# Patient Record
Sex: Female | Born: 1960
Health system: Southern US, Community
[De-identification: ages and names within clinical notes are randomized; demographics above are authoritative.]

## PROBLEM LIST (undated history)

## (undated) DIAGNOSIS — F32A Depression, unspecified: Secondary | ICD-10-CM

## (undated) DIAGNOSIS — Z9289 Personal history of other medical treatment: Secondary | ICD-10-CM

## (undated) DIAGNOSIS — C649 Malignant neoplasm of unspecified kidney, except renal pelvis: Secondary | ICD-10-CM

## (undated) DIAGNOSIS — D649 Anemia, unspecified: Secondary | ICD-10-CM

## (undated) DIAGNOSIS — K661 Hemoperitoneum: Secondary | ICD-10-CM

## (undated) DIAGNOSIS — K683 Retroperitoneal hematoma: Secondary | ICD-10-CM

## (undated) DIAGNOSIS — T4145XA Adverse effect of unspecified anesthetic, initial encounter: Secondary | ICD-10-CM

## (undated) DIAGNOSIS — K219 Gastro-esophageal reflux disease without esophagitis: Secondary | ICD-10-CM

## (undated) DIAGNOSIS — F329 Major depressive disorder, single episode, unspecified: Secondary | ICD-10-CM

## (undated) DIAGNOSIS — K449 Diaphragmatic hernia without obstruction or gangrene: Secondary | ICD-10-CM

## (undated) DIAGNOSIS — N390 Urinary tract infection, site not specified: Secondary | ICD-10-CM

## (undated) DIAGNOSIS — I10 Essential (primary) hypertension: Secondary | ICD-10-CM

## (undated) DIAGNOSIS — N189 Chronic kidney disease, unspecified: Secondary | ICD-10-CM

## (undated) DIAGNOSIS — T8859XA Other complications of anesthesia, initial encounter: Secondary | ICD-10-CM

## (undated) HISTORY — DX: Essential (primary) hypertension: I10

## (undated) HISTORY — DX: Urinary tract infection, site not specified: N39.0

## (undated) HISTORY — DX: Gastro-esophageal reflux disease without esophagitis: K21.9

## (undated) HISTORY — DX: Chronic kidney disease, unspecified: N18.9

## (undated) HISTORY — DX: Depression, unspecified: F32.A

## (undated) HISTORY — DX: Major depressive disorder, single episode, unspecified: F32.9

## (undated) HISTORY — DX: Malignant neoplasm of unspecified kidney, except renal pelvis: C64.9

## (undated) HISTORY — DX: Diaphragmatic hernia without obstruction or gangrene: K44.9

---
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
## 2003-08-25 ENCOUNTER — Other Ambulatory Visit: Admission: RE | Admit: 2003-08-25 | Discharge: 2003-08-25 | Payer: Self-pay | Admitting: Obstetrics and Gynecology

## 2003-09-15 ENCOUNTER — Ambulatory Visit (HOSPITAL_COMMUNITY): Admission: RE | Admit: 2003-09-15 | Discharge: 2003-09-15 | Payer: Self-pay | Admitting: Obstetrics and Gynecology

## 2003-09-15 ENCOUNTER — Encounter: Payer: Self-pay | Admitting: Obstetrics and Gynecology

## 2004-12-09 HISTORY — PX: AUGMENTATION MAMMAPLASTY: SUR837

## 2005-05-23 ENCOUNTER — Ambulatory Visit (HOSPITAL_COMMUNITY): Admission: RE | Admit: 2005-05-23 | Discharge: 2005-05-23 | Payer: Self-pay | Admitting: Plastic Surgery

## 2005-05-24 ENCOUNTER — Ambulatory Visit (HOSPITAL_COMMUNITY): Admission: RE | Admit: 2005-05-24 | Discharge: 2005-05-24 | Payer: Self-pay

## 2005-05-28 ENCOUNTER — Ambulatory Visit (HOSPITAL_COMMUNITY): Admission: RE | Admit: 2005-05-28 | Discharge: 2005-05-28 | Payer: Self-pay | Admitting: Plastic Surgery

## 2007-02-18 ENCOUNTER — Other Ambulatory Visit: Admission: RE | Admit: 2007-02-18 | Discharge: 2007-02-18 | Payer: Self-pay | Admitting: Family Medicine

## 2007-04-08 ENCOUNTER — Ambulatory Visit (HOSPITAL_COMMUNITY): Admission: RE | Admit: 2007-04-08 | Discharge: 2007-04-08 | Payer: Self-pay | Admitting: General Surgery

## 2007-04-08 HISTORY — PX: UMBILICAL HERNIA REPAIR: SHX196

## 2008-03-25 ENCOUNTER — Other Ambulatory Visit: Admission: RE | Admit: 2008-03-25 | Discharge: 2008-03-25 | Payer: Self-pay | Admitting: Family Medicine

## 2011-04-26 NOTE — Op Note (Signed)
NAMEMUREL, WIGLE               ACCOUNT NO.:  [id number]   MEDICAL RECORD NO.:  [id number]          PATIENT TYPE:  AMB   LOCATION:  DAY                          FACILITY:  [facility]   PHYSICIAN:  Timothy E. Earlene Plater, M.D. DATE OF BIRTH:  1961/04/29   DATE OF PROCEDURE:  04/08/2007  DATE OF DISCHARGE:                               OPERATIVE REPORT   PREOPERATIVE DIAGNOSES:  1. Questionable ventral incisional hernia.  2. Umbilical hernia.  3. Diastasis recti.   POSTOPERATIVE DIAGNOSES:  1. Umbilical hernia.  2. Diastasis recti.   PROCEDURE:  1. Laparoscopy  2. Plication of rectus muscles.  3. Repair of umbilical hernia.  4. Revision of scar   SURGEON:  Lorelee New, MD   ASSISTANT:  Alfonse Ras, MD   ANESTHESIA:  General.   INDICATIONS:  Ms. Amaker is 6.  She has was is thought be a ventral  incisional hernia.  She had an emergency C-section with a vertical  incision and over time has developed protrusion at the superior aspect  of the incision and around the umbilicus.  When seen and examined in the  office, I felt like it was at least a ventral incisional hernia or  perhaps that in combination with an umbilical hernia.  Diastasis recti  was also prominent in the upper abdomen.  She is thin and has no risk  factors.  She is also unhappy with her vertical skin scar and wanted  that revised.  We talked in detail about laparoscopy and appropriate  repair.  She agrees and understands.  She was seen and identified this  morning.  Permit signed.   DESCRIPTION OF PROCEDURE:  The patient was taken to the operating room  and was placed supine.  She was carefully positioned and then general  endotracheal anesthesia was  administered.  The abdomen was completely  prepped and draped in the usual fashion.  We elected to do an OptiVu  insertion of an 11 port with the laparoscope.  This was done in the left  upper quadrant without complication.  The peritoneum was entered and  insufflated.  Using the angled scope, complete visualization of the  anterior abdominal wall was accomplished and, in fact, there was no  herniation.  Even with maximum CO2 pressure in the  intra-abdominal  area, there was no protrusion through the skin.  We were very careful to  ascertain the diastasis versus ventral versus umbilical hernia.  So, we  elected to absolutely not put any mesh in and that mesh would not  accomplish the desired results of repair of the abdominal wall and the  umbilical hernia was too tiny to be repaired with mesh.  So, we elected  to remove the scope and the trocar.  All of the CO2 was removed as well.   The patient insisted that we also do a revision of the skin scar, and so  I had carefully marked the scar during the prep time and that old scar  tissue was sharply excised very carefully from the infraumbilical fold  to the suprapubic area.  Bleeding was controlled.  I raised a flap on  either side so that the skin could be approximated.  In doing so, it was  obvious that the edges of the rectus muscle were separated.  Also, the  umbilical hernia was clearly defined.  It was about 8 to 10 mm.  So, I  elected to close the umbilical hernia with a #1 Novofil inverted and,  since we were right there in the subcutaneous space looking at the  fascia, I elected to plicate the fascia from above the umbilicus  tapering down to the suprapubic area.  This was done after relaxing  incisions were made and was accomplished with 2-0 PDS suture.  Bleeding  was carefully controlled.  The wound was perfectly dry.  It  was  irrigated.  The subcu was approximated with 4-0 Vicryl.  The skin was  closed with 4-0 Monocryl subcuticular.  Steri-Strips were applied.  Also, the port site was closed in the same fashion.  All counts were  correct.  She tolerated the surgery well and was removed to the recovery  room in good condition.   She will be sent home with Percocet instructions  and an abdominal binder  to wear when active.  She will be followed in the office as well.      Timothy E. Earlene Plater, M.D.  Electronically Signed     TED/MEDQ  D:  04/08/2007  T:  04/08/2007  Job:  [id number]

## 2017-09-25 DIAGNOSIS — Z01419 Encounter for gynecological examination (general) (routine) without abnormal findings: Secondary | ICD-10-CM | POA: Diagnosis not present

## 2017-09-25 DIAGNOSIS — Z0000 Encounter for general adult medical examination without abnormal findings: Secondary | ICD-10-CM | POA: Diagnosis not present

## 2017-10-09 DIAGNOSIS — N6320 Unspecified lump in the left breast, unspecified quadrant: Secondary | ICD-10-CM | POA: Diagnosis not present

## 2017-10-16 DIAGNOSIS — N390 Urinary tract infection, site not specified: Secondary | ICD-10-CM | POA: Diagnosis not present

## 2017-10-21 ENCOUNTER — Ambulatory Visit
Admission: RE | Admit: 2017-10-21 | Discharge: 2017-10-21 | Disposition: A | Discharge status: Home / Self Care | Payer: BLUE CROSS/BLUE SHIELD | Source: Ambulatory Visit | Attending: Family Medicine | Admitting: Family Medicine

## 2017-10-21 ENCOUNTER — Other Ambulatory Visit: Payer: Self-pay | Admitting: Family Medicine

## 2017-10-21 DIAGNOSIS — R109 Unspecified abdominal pain: Secondary | ICD-10-CM

## 2017-10-21 DIAGNOSIS — R319 Hematuria, unspecified: Secondary | ICD-10-CM

## 2017-10-27 DIAGNOSIS — Z23 Encounter for immunization: Secondary | ICD-10-CM | POA: Diagnosis not present

## 2017-11-11 DIAGNOSIS — R109 Unspecified abdominal pain: Secondary | ICD-10-CM | POA: Diagnosis not present

## 2017-11-11 DIAGNOSIS — R1012 Left upper quadrant pain: Secondary | ICD-10-CM | POA: Diagnosis not present

## 2018-08-24 DIAGNOSIS — Z1231 Encounter for screening mammogram for malignant neoplasm of breast: Secondary | ICD-10-CM | POA: Diagnosis not present

## 2018-09-05 ENCOUNTER — Emergency Department (HOSPITAL_COMMUNITY): Payer: BLUE CROSS/BLUE SHIELD

## 2018-09-05 ENCOUNTER — Encounter (HOSPITAL_COMMUNITY): Payer: Self-pay | Admitting: Emergency Medicine

## 2018-09-05 ENCOUNTER — Other Ambulatory Visit: Payer: Self-pay

## 2018-09-05 ENCOUNTER — Inpatient Hospital Stay (HOSPITAL_COMMUNITY)
Admission: EM | Admit: 2018-09-05 | Discharge: 2018-09-28 | DRG: 656 | Disposition: A | Discharge status: Home / Self Care | Payer: BLUE CROSS/BLUE SHIELD | Attending: Urology | Admitting: Urology

## 2018-09-05 DIAGNOSIS — Z4901 Encounter for fitting and adjustment of extracorporeal dialysis catheter: Secondary | ICD-10-CM | POA: Diagnosis not present

## 2018-09-05 DIAGNOSIS — E872 Acidosis: Secondary | ICD-10-CM | POA: Diagnosis present

## 2018-09-05 DIAGNOSIS — R571 Hypovolemic shock: Secondary | ICD-10-CM | POA: Diagnosis not present

## 2018-09-05 DIAGNOSIS — R188 Other ascites: Secondary | ICD-10-CM | POA: Diagnosis not present

## 2018-09-05 DIAGNOSIS — Z87448 Personal history of other diseases of urinary system: Secondary | ICD-10-CM | POA: Diagnosis not present

## 2018-09-05 DIAGNOSIS — Z23 Encounter for immunization: Secondary | ICD-10-CM | POA: Diagnosis not present

## 2018-09-05 DIAGNOSIS — J90 Pleural effusion, not elsewhere classified: Secondary | ICD-10-CM | POA: Diagnosis not present

## 2018-09-05 DIAGNOSIS — N179 Acute kidney failure, unspecified: Secondary | ICD-10-CM | POA: Diagnosis not present

## 2018-09-05 DIAGNOSIS — K661 Hemoperitoneum: Secondary | ICD-10-CM | POA: Diagnosis not present

## 2018-09-05 DIAGNOSIS — I10 Essential (primary) hypertension: Secondary | ICD-10-CM | POA: Diagnosis not present

## 2018-09-05 DIAGNOSIS — R8271 Bacteriuria: Secondary | ICD-10-CM

## 2018-09-05 DIAGNOSIS — R21 Rash and other nonspecific skin eruption: Secondary | ICD-10-CM | POA: Diagnosis not present

## 2018-09-05 DIAGNOSIS — R809 Proteinuria, unspecified: Secondary | ICD-10-CM | POA: Diagnosis not present

## 2018-09-05 DIAGNOSIS — D500 Iron deficiency anemia secondary to blood loss (chronic): Secondary | ICD-10-CM | POA: Diagnosis not present

## 2018-09-05 DIAGNOSIS — R0902 Hypoxemia: Secondary | ICD-10-CM

## 2018-09-05 DIAGNOSIS — E876 Hypokalemia: Secondary | ICD-10-CM | POA: Diagnosis not present

## 2018-09-05 DIAGNOSIS — R58 Hemorrhage, not elsewhere classified: Secondary | ICD-10-CM

## 2018-09-05 DIAGNOSIS — E781 Pure hyperglyceridemia: Secondary | ICD-10-CM | POA: Diagnosis not present

## 2018-09-05 DIAGNOSIS — N19 Unspecified kidney failure: Secondary | ICD-10-CM | POA: Diagnosis not present

## 2018-09-05 DIAGNOSIS — R911 Solitary pulmonary nodule: Secondary | ICD-10-CM | POA: Diagnosis not present

## 2018-09-05 DIAGNOSIS — N2889 Other specified disorders of kidney and ureter: Secondary | ICD-10-CM | POA: Diagnosis not present

## 2018-09-05 DIAGNOSIS — A419 Sepsis, unspecified organism: Secondary | ICD-10-CM

## 2018-09-05 DIAGNOSIS — K219 Gastro-esophageal reflux disease without esophagitis: Secondary | ICD-10-CM | POA: Diagnosis not present

## 2018-09-05 DIAGNOSIS — C642 Malignant neoplasm of left kidney, except renal pelvis: Secondary | ICD-10-CM | POA: Diagnosis not present

## 2018-09-05 DIAGNOSIS — D62 Acute posthemorrhagic anemia: Secondary | ICD-10-CM | POA: Diagnosis not present

## 2018-09-05 DIAGNOSIS — D649 Anemia, unspecified: Secondary | ICD-10-CM

## 2018-09-05 DIAGNOSIS — D4102 Neoplasm of uncertain behavior of left kidney: Secondary | ICD-10-CM | POA: Diagnosis not present

## 2018-09-05 DIAGNOSIS — Z9889 Other specified postprocedural states: Secondary | ICD-10-CM | POA: Diagnosis not present

## 2018-09-05 DIAGNOSIS — Z87891 Personal history of nicotine dependence: Secondary | ICD-10-CM | POA: Diagnosis not present

## 2018-09-05 DIAGNOSIS — D72829 Elevated white blood cell count, unspecified: Secondary | ICD-10-CM | POA: Diagnosis present

## 2018-09-05 DIAGNOSIS — N151 Renal and perinephric abscess: Secondary | ICD-10-CM | POA: Diagnosis not present

## 2018-09-05 DIAGNOSIS — R310 Gross hematuria: Secondary | ICD-10-CM | POA: Diagnosis present

## 2018-09-05 DIAGNOSIS — R319 Hematuria, unspecified: Secondary | ICD-10-CM | POA: Diagnosis not present

## 2018-09-05 DIAGNOSIS — R6520 Severe sepsis without septic shock: Secondary | ICD-10-CM | POA: Diagnosis not present

## 2018-09-05 DIAGNOSIS — N170 Acute kidney failure with tubular necrosis: Secondary | ICD-10-CM | POA: Diagnosis not present

## 2018-09-05 DIAGNOSIS — Z79899 Other long term (current) drug therapy: Secondary | ICD-10-CM | POA: Diagnosis not present

## 2018-09-05 DIAGNOSIS — R1032 Left lower quadrant pain: Secondary | ICD-10-CM | POA: Diagnosis not present

## 2018-09-05 DIAGNOSIS — E871 Hypo-osmolality and hyponatremia: Secondary | ICD-10-CM | POA: Diagnosis not present

## 2018-09-05 DIAGNOSIS — E860 Dehydration: Secondary | ICD-10-CM | POA: Diagnosis not present

## 2018-09-05 DIAGNOSIS — N9089 Other specified noninflammatory disorders of vulva and perineum: Secondary | ICD-10-CM | POA: Diagnosis not present

## 2018-09-05 DIAGNOSIS — K683 Retroperitoneal hematoma: Secondary | ICD-10-CM | POA: Diagnosis present

## 2018-09-05 DIAGNOSIS — J9811 Atelectasis: Secondary | ICD-10-CM | POA: Diagnosis present

## 2018-09-05 HISTORY — DX: Other complications of anesthesia, initial encounter: T88.59XA

## 2018-09-05 HISTORY — DX: Hemoperitoneum: K66.1

## 2018-09-05 HISTORY — DX: Retroperitoneal hematoma: K68.3

## 2018-09-05 HISTORY — DX: Personal history of other medical treatment: Z92.89

## 2018-09-05 HISTORY — DX: Anemia, unspecified: D64.9

## 2018-09-05 HISTORY — DX: Adverse effect of unspecified anesthetic, initial encounter: T41.45XA

## 2018-09-05 LAB — COMPREHENSIVE METABOLIC PANEL
ALBUMIN: 2.6 g/dL — AB (ref 3.5–5.0)
ALT: 10 U/L (ref 0–44)
AST: 160 U/L — ABNORMAL HIGH (ref 15–41)
Alkaline Phosphatase: 72 U/L (ref 38–126)
Anion gap: 14 (ref 5–15)
BUN: 53 mg/dL — AB (ref 6–20)
CHLORIDE: 93 mmol/L — AB (ref 98–111)
CO2: 19 mmol/L — ABNORMAL LOW (ref 22–32)
Calcium: 7.7 mg/dL — ABNORMAL LOW (ref 8.9–10.3)
Creatinine, Ser: 3.21 mg/dL — ABNORMAL HIGH (ref 0.44–1.00)
GFR calc Af Amer: 17 mL/min — ABNORMAL LOW (ref >60)
GFR calc non Af Amer: 15 mL/min — ABNORMAL LOW (ref >60)
GLUCOSE: 114 mg/dL — AB (ref 70–99)
POTASSIUM: 4 mmol/L (ref 3.5–5.1)
SODIUM: 126 mmol/L — AB (ref 135–145)
Total Bilirubin: 1 mg/dL (ref 0.3–1.2)
Total Protein: 5.8 g/dL — ABNORMAL LOW (ref 6.5–8.1)

## 2018-09-05 LAB — BASIC METABOLIC PANEL
Anion gap: 19 — ABNORMAL HIGH (ref 5–15)
BUN: 50 mg/dL — AB (ref 6–20)
CO2: 19 mmol/L — ABNORMAL LOW (ref 22–32)
CREATININE: 3.14 mg/dL — AB (ref 0.44–1.00)
Calcium: 9 mg/dL (ref 8.9–10.3)
Chloride: 88 mmol/L — ABNORMAL LOW (ref 98–111)
GFR calc Af Amer: 18 mL/min — ABNORMAL LOW (ref >60)
GFR, EST NON AFRICAN AMERICAN: 15 mL/min — AB (ref >60)
GLUCOSE: 153 mg/dL — AB (ref 70–99)
Potassium: 4 mmol/L (ref 3.5–5.1)
Sodium: 126 mmol/L — ABNORMAL LOW (ref 135–145)

## 2018-09-05 LAB — URINALYSIS, ROUTINE W REFLEX MICROSCOPIC
Glucose, UA: 100 mg/dL — AB
KETONES UR: 15 mg/dL — AB
NITRITE: POSITIVE — AB
Specific Gravity, Urine: 1.02 (ref 1.005–1.030)
pH: 6.5 (ref 5.0–8.0)

## 2018-09-05 LAB — CBC
HCT: 21.2 % — ABNORMAL LOW (ref 36.0–46.0)
HEMATOCRIT: 27.5 % — AB (ref 36.0–46.0)
Hemoglobin: 8.7 g/dL — ABNORMAL LOW (ref 12.0–15.0)
Hemoglobin: 6.9 g/dL — CL (ref 12.0–15.0)
MCH: 27.9 pg (ref 26.0–34.0)
MCH: 27.9 pg (ref 26.0–34.0)
MCHC: 31.6 g/dL (ref 30.0–36.0)
MCHC: 32.5 g/dL (ref 30.0–36.0)
MCV: 88.1 fL (ref 78.0–100.0)
MCV: 85.8 fL (ref 78.0–100.0)
Platelets: 217 10*3/uL (ref 150–400)
Platelets: 185 10*3/uL (ref 150–400)
RBC: 3.12 MIL/uL — AB (ref 3.87–5.11)
RBC: 2.47 MIL/uL — ABNORMAL LOW (ref 3.87–5.11)
RDW: 13.7 % (ref 11.5–15.5)
RDW: 13.6 % (ref 11.5–15.5)
WBC: 30.1 10*3/uL — ABNORMAL HIGH (ref 4.0–10.5)
WBC: 22 10*3/uL — ABNORMAL HIGH (ref 4.0–10.5)

## 2018-09-05 LAB — I-STAT CG4 LACTIC ACID, ED
LACTIC ACID, VENOUS: 1.84 mmol/L (ref 0.5–1.9)
Lactic Acid, Venous: 2.11 mmol/L (ref 0.5–1.9)

## 2018-09-05 LAB — URINALYSIS, MICROSCOPIC (REFLEX)
RBC / HPF: >50 RBC/hpf (ref 0–5)
WBC, UA: >50 WBC/hpf (ref 0–5)

## 2018-09-05 LAB — I-STAT TROPONIN, ED: Troponin i, poc: 0.06 ng/mL (ref 0.00–0.08)

## 2018-09-05 LAB — PREPARE RBC (CROSSMATCH)

## 2018-09-05 LAB — I-STAT BETA HCG BLOOD, ED (MC, WL, AP ONLY): I-stat hCG, quantitative: 23.9 m[IU]/mL — ABNORMAL HIGH (ref <5)

## 2018-09-05 LAB — ABO/RH: ABO/RH(D): A POS

## 2018-09-05 LAB — SAVE SMEAR

## 2018-09-05 LAB — PREGNANCY, URINE: PREG TEST UR: NEGATIVE

## 2018-09-05 MED ORDER — MORPHINE SULFATE (PF) 4 MG/ML IV SOLN
6.0000 mg | Freq: Once | INTRAVENOUS | Status: AC
  Administered 2018-09-05: 6 mg via INTRAVENOUS
  Filled 2018-09-05: qty 2

## 2018-09-05 MED ORDER — HYDROMORPHONE HCL 1 MG/ML IJ SOLN: 1.0000 mg | INTRAMUSCULAR | Status: DC | PRN

## 2018-09-05 MED ORDER — HYDROMORPHONE HCL 1 MG/ML IJ SOLN
1.0000 mg | INTRAMUSCULAR | Status: DC | PRN
  Administered 2018-09-05 – 2018-09-14 (×50): 1 mg via INTRAVENOUS
  Filled 2018-09-05 (×52): qty 1

## 2018-09-05 MED ORDER — SODIUM CHLORIDE 0.9 % IV SOLN
1.0000 g | Freq: Once | INTRAVENOUS | Status: AC
  Administered 2018-09-05: 1 g via INTRAVENOUS
  Filled 2018-09-05: qty 10

## 2018-09-05 MED ORDER — SODIUM CHLORIDE 0.9 % IV SOLN
1.0000 g | INTRAVENOUS | Status: DC
  Administered 2018-09-05 – 2018-09-07 (×3): 1 g via INTRAVENOUS
  Filled 2018-09-05: qty 10
  Filled 2018-09-05: qty 1
  Filled 2018-09-05: qty 10

## 2018-09-05 MED ORDER — LIDOCAINE HCL URETHRAL/MUCOSAL 2 % EX GEL
1.0000 "application " | Freq: Once | CUTANEOUS | Status: AC
  Administered 2018-09-05: 1 via TOPICAL
  Filled 2018-09-05: qty 20

## 2018-09-05 MED ORDER — SODIUM CHLORIDE 0.9% IV SOLUTION
Freq: Once | INTRAVENOUS | Status: AC
  Administered 2018-09-05: 18:00:00 via INTRAVENOUS

## 2018-09-05 MED ORDER — SODIUM CHLORIDE 0.9 % IV BOLUS (SEPSIS)
1000.0000 mL | Freq: Once | INTRAVENOUS | Status: AC
  Administered 2018-09-05: 1000 mL via INTRAVENOUS

## 2018-09-05 MED ORDER — ONDANSETRON HCL 4 MG/2ML IJ SOLN
4.0000 mg | Freq: Once | INTRAMUSCULAR | Status: AC
  Administered 2018-09-05: 4 mg via INTRAVENOUS
  Filled 2018-09-05: qty 2

## 2018-09-05 MED ORDER — HYDROMORPHONE HCL 1 MG/ML IJ SOLN: 1.0000 mg | Freq: Once | INTRAMUSCULAR | Status: DC

## 2018-09-05 NOTE — ED Triage Notes (Signed)
Difficult IV stick  Unable to collect I-stat Lac Acid.

## 2018-09-05 NOTE — ED Provider Notes (Signed)
[facility] EMERGENCY DEPARTMENT Provider Note   CSN: [id number] Arrival date & time: 09/05/18  0813     History   Chief Complaint Chief Complaint  Patient presents with  . Hematuria    HPI Sharon Henson is a 57 y.o. female.  HPI Patient is a 57 year old female who presents to the emergency department 3 days of worsening left-sided abdominal pain with associated nausea vomiting.  She is had low-grade fevers as well.  She does not have an actual recorded temperature.  She also reports new hematuria over the past several days and a feeling of a new abdominal mass on her left side.  She had similar symptoms in November 2018 at which point she had a plain film which demonstrated possible proximal stone but when she was seen by [facility] urology and outpatient CT was ordered however the patients pain had resolved and therefore she did not undergo the study.  Pain symptoms are moderate to severe in severity at this time.  No prior history of cancer.  She does report a history of umbilical hernia repair with mesh.     History reviewed. No pertinent past medical history.  There are no active problems to display for this patient.   Past Surgical History:  Procedure Laterality Date  . CESAREAN SECTION       OB History   None      Home Medications    Prior to Admission medications   Not on File    Family History No family history on file.  Social History Social History   Tobacco Use  . Smoking status: Never Smoker  Substance Use Topics  . Alcohol use: Never    Frequency: Never  . Drug use: Never     Allergies   Patient has no known allergies.   Review of Systems Review of Systems  All other systems reviewed and are negative.    Physical Exam Updated Vital Signs BP (!) 142/127   Pulse (!) 128   Temp 98.9 F (37.2 C) (Oral)   Resp 15   SpO2 97%   Physical Exam  Constitutional: She is oriented to person, place, and time. She  appears well-developed and well-nourished. No distress.  HENT:  Head: Normocephalic and atraumatic.  Eyes: EOM are normal.  Neck: Normal range of motion.  Cardiovascular: Regular rhythm and normal heart sounds.  Tachycardia  Pulmonary/Chest: Effort normal and breath sounds normal.  Abdominal: Soft. She exhibits no distension.  Palpable mid and left-sided abdominal mass.  Nonpulsatile.  Musculoskeletal: Normal range of motion.  Neurological: She is alert and oriented to person, place, and time.  Skin: Skin is warm and dry. There is pallor.  Psychiatric: She has a normal mood and affect. Judgment normal.  Nursing note and vitals reviewed.    ED Treatments / Results  Labs (all labs ordered are listed, but only abnormal results are displayed) Labs Reviewed  BASIC METABOLIC PANEL - Abnormal; Notable for the following components:      Result Value   Sodium 126 (*)    Chloride 88 (*)    CO2 19 (*)    Glucose, Bld 153 (*)    BUN 50 (*)    Creatinine, Ser 3.14 (*)    GFR calc non Af Amer 15 (*)    GFR calc Af Amer 18 (*)    Anion gap 19 (*)    All other components within normal limits  CBC - Abnormal; Notable for the following  components:   WBC 30.1 (*)    RBC 3.12 (*)    Hemoglobin 8.7 (*)    HCT 27.5 (*)    All other components within normal limits  I-STAT BETA HCG BLOOD, ED ([facility], [facility], [facility] ONLY) - Abnormal; Notable for the following components:   I-stat hCG, quantitative 23.9 (*)    All other components within normal limits  URINE CULTURE  CULTURE, BLOOD (ROUTINE X 2)  CULTURE, BLOOD (ROUTINE X 2)  URINALYSIS, ROUTINE W REFLEX MICROSCOPIC  PREGNANCY, URINE  I-STAT TROPONIN, ED  I-STAT CG4 LACTIC ACID, ED  TYPE AND SCREEN    EKG None  Radiology Ct Renal Stone Study  Result Date: 09/05/2018 CLINICAL DATA:  Fever and nausea since Wednesday. EXAM: CT ABDOMEN AND PELVIS WITHOUT CONTRAST TECHNIQUE: Multidetector CT imaging of the abdomen and pelvis was performed following  the standard protocol without IV contrast. COMPARISON:  None. FINDINGS: Lower chest: Small left pleural effusion with mild compressive atelectasis in the left lower lobe. Heart size normal. No pericardial effusion. Hepatobiliary: Liver and gallbladder are unremarkable. No biliary ductal dilatation. Pancreas: Negative. Spleen: Negative. Adrenals/Urinary Tract: Adrenal glands and right kidney are unremarkable. There is a very large heterogeneous mass with internal hemorrhage arising from the left kidney, measuring 12.4 x 15.6 cm. Perianal stranding and fluid on the left. Hemorrhage is seen in the left renal pelvis. There may be mild left hydronephrosis. Ureters are decompressed. Bladder is very low in volume. Stomach/Bowel: Small hiatal hernia. Stomach, small bowel and colon are unremarkable. Favor fluid-filled small bowel in the dependent anatomic pelvis over free fluid. Vascular/Lymphatic: Atherosclerotic calcification of the arterial vasculature without abdominal aortic aneurysm. No definite pathologically enlarged lymph nodes. Reproductive: Uterus is visualized.  No adnexal mass. Other: Small ascites. Musculoskeletal: No worrisome lytic or sclerotic lesions. IMPRESSION: 1. Very large hemorrhagic mass arising from the left kidney, highly worrisome for renal cell carcinoma. These results were called by telephone at the time of interpretation on 09/05/2018 at 10:34 am to Dr. Jola Schmidt , who verbally acknowledged these results. 2. High attenuation within the left renal pelvis is indicative of hemorrhage/clot, with probable mild associated left hydronephrosis. 3. Small ascites. 4. Small left pleural effusion. 5.  Aortic atherosclerosis (ICD10-170.0). Electronically Signed   By: Lorin Picket M.D.   On: 09/05/2018 10:34    Procedures .Critical Care Performed by: Jola Schmidt, MD Authorized by: Jola Schmidt, MD    CRITICAL CARE Performed by: Jola Schmidt Total critical care time: 33 minutes Critical  care time was exclusive of separately billable procedures and treating other patients. Critical care was necessary to treat or prevent imminent or life-threatening deterioration. Critical care was time spent personally by me on the following activities: development of treatment plan with patient and/or surrogate as well as nursing, discussions with consultants, evaluation of patient's response to treatment, examination of patient, obtaining history from patient or surrogate, ordering and performing treatments and interventions, ordering and review of laboratory studies, ordering and review of radiographic studies, pulse oximetry and re-evaluation of patient's condition.   Medications Ordered in ED Medications  cefTRIAXone (ROCEPHIN) 1 g in sodium chloride 0.9 % 100 mL IVPB (1 g Intravenous New Bag/Given 09/05/18 1039)  sodium chloride 0.9 % bolus 1,000 mL (has no administration in time range)    And  sodium chloride 0.9 % bolus 1,000 mL (has no administration in time range)    And  sodium chloride 0.9 % bolus 1,000 mL (has no administration in time range)  morphine  4 MG/ML injection 6 mg (6 mg Intravenous Given 09/05/18 1044)  ondansetron (ZOFRAN) injection 4 mg (4 mg Intravenous Given 09/05/18 1044)     Initial Impression / Assessment and Plan / ED Course  I have reviewed the triage vital signs and the nursing notes.  Pertinent labs & imaging results that were available during my care of the patient were reviewed by me and considered in my medical decision making (see chart for details).     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  Hemoglobin 8.7.  Heart rate improved with fluids.  She will need serial hemoglobins checked.  Type and screen has been ordered.  Case discussed with Dr. Gloriann Loan of urology who will see the patient in consultation.  With reported low-grade  fevers at home and tachycardia as well as white count 30,000 she will be started on Rocephin for possible urinary tract infection.  Blood cultures obtained.  Dr. Gloriann Loan is requesting CT imaging of the chest at this time to further complete her cancer work-up.  Bladder scan pending at this time.  Acute renal failure may be secondary to combination of dehydration and solitary functioning kidney  Final Clinical Impressions(s) / ED Diagnoses   Final diagnoses:  None    ED Discharge Orders    None       Jola Schmidt, MD 09/05/18 1110

## 2018-09-05 NOTE — ED Triage Notes (Signed)
IV Team unable to obtain 2nd istat  Lactic Acid

## 2018-09-05 NOTE — ED Triage Notes (Addendum)
Pt reports blood in her urine, lower abd pain, fevers and nausea since Wednesday. Did not check temp at home. Denies urinary retention

## 2018-09-05 NOTE — Consult Note (Addendum)
H&P   Physician requesting consult: Lalla Brothers, MD  Chief Complaint: Left retroperitoneal hematoma with hemorrhagic mass  History of Present Illness: 57 year old female with a history of gross hematuria.  She went to get this worked up 1 year ago but her pain improved and she did not follow-up at [facility].  She presented today with an acute onset of severe left-sided pain that started on Wednesday.  Her pain has progressively worsened.  CT knee without contrast of the abdomen and pelvis was performed which showed a heterogeneous left kidney likely representing a mass with recent or active hemorrhage.  There was surrounding hemorrhagic products.  There was also some calcification present.  There was not a discrete mass that I could see however radiology read mentioned the possibility of renal cell carcinoma.  There was no obvious fat within a mass.  The patient continues to have left-sided abdominal pain.  She has been hypertensive and slightly tachycardic.  Hemoglobin was found to be 8.7.  I do not have a baseline.  She does not know her baseline.  She was also found to have acute renal insufficiency with a creatinine of 3.14.  History reviewed. No pertinent past medical history. Past Surgical History:  Procedure Laterality Date  . CESAREAN SECTION      Home Medications:   (Not in a hospital admission) Allergies: No Known Allergies  No family history on file. Social History:  reports that she has never smoked. She does not have any smokeless tobacco history on file. She reports that she does not drink alcohol or use drugs.  ROS: A complete review of systems was performed.  All systems are negative except for pertinent findings as noted. ROS   Physical Exam:  Vital signs in last 24 hours: Temp:  [98.9 F (37.2 C)] 98.9 F (37.2 C) (09/28 0818) Pulse Rate:  [100-128] 107 (09/28 1515) Resp:  [15-32] 31 (09/28 1515) BP: (142-193)/(93-141) 159/100 (09/28 1515) SpO2:  [90 %-97 %] 95 %  (09/28 1515) General:  Alert and oriented, No acute distress HEENT: Normocephalic, atraumatic Neck: No JVD or lymphadenopathy Cardiovascular: Regular rate and rhythm Lungs: Regular rate and effort Abdomen: Abdomen soft.  She has a palpable mass in the left side.  Her left side is mild to moderately tender to palpation.  She has no flank ecchymosis Back: No CVA tenderness Extremities: No edema Neurologic: Grossly intact  Laboratory Data:  Results for orders placed or performed during the hospital encounter of 09/05/18 (from the past 24 hour(s))  Basic metabolic panel     Status: Abnormal   Collection Time: 09/05/18  8:34 AM  Result Value Ref Range   Sodium 126 (L) 135 - 145 mmol/L   Potassium 4.0 3.5 - 5.1 mmol/L   Chloride 88 (L) 98 - 111 mmol/L   CO2 19 (L) 22 - 32 mmol/L   Glucose, Bld 153 (H) 70 - 99 mg/dL   BUN 50 (H) 6 - 20 mg/dL   Creatinine, Ser 3.14 (H) 0.44 - 1.00 mg/dL   Calcium 9.0 8.9 - 10.3 mg/dL   GFR calc non Af Amer 15 (L) >60 mL/min   GFR calc Af Amer 18 (L) >60 mL/min   Anion gap 19 (H) 5 - 15  CBC     Status: Abnormal   Collection Time: 09/05/18  8:34 AM  Result Value Ref Range   WBC 30.1 (H) 4.0 - 10.5 K/uL   RBC 3.12 (L) 3.87 - 5.11 MIL/uL   Hemoglobin 8.7 (L) 12.0 -  15.0 g/dL   HCT 27.5 (L) 36.0 - 46.0 %   MCV 88.1 78.0 - 100.0 fL   MCH 27.9 26.0 - 34.0 pg   MCHC 31.6 30.0 - 36.0 g/dL   RDW 13.7 11.5 - 15.5 %   Platelets 217 150 - 400 K/uL  I-Stat beta hCG blood, ED     Status: Abnormal   Collection Time: 09/05/18  8:40 AM  Result Value Ref Range   I-stat hCG, quantitative 23.9 (H) <5 mIU/mL   Comment 3          I-stat troponin, ED     Status: None   Collection Time: 09/05/18 10:50 AM  Result Value Ref Range   Troponin i, poc 0.06 0.00 - 0.08 ng/mL   Comment 3          I-Stat CG4 Lactic Acid, ED     Status: Abnormal   Collection Time: 09/05/18 12:00 PM  Result Value Ref Range   Lactic Acid, Venous 2.11 (HH) 0.5 - 1.9 mmol/L   Comment NOTIFIED  PHYSICIAN   Urinalysis, Routine w reflex microscopic- may I&O cath if menses     Status: Abnormal   Collection Time: 09/05/18  1:23 PM  Result Value Ref Range   Color, Urine BROWN (A) YELLOW   APPearance TURBID (A) CLEAR   Specific Gravity, Urine 1.020 1.005 - 1.030   pH 6.5 5.0 - 8.0   Glucose, UA 100 (A) NEGATIVE mg/dL   Hgb urine dipstick LARGE (A) NEGATIVE   Bilirubin Urine MODERATE (A) NEGATIVE   Ketones, ur 15 (A) NEGATIVE mg/dL   Protein, ur >300 (A) NEGATIVE mg/dL   Nitrite POSITIVE (A) NEGATIVE   Leukocytes, UA MODERATE (A) NEGATIVE  Pregnancy, urine     Status: None   Collection Time: 09/05/18  1:23 PM  Result Value Ref Range   Preg Test, Ur NEGATIVE NEGATIVE  Urinalysis, Microscopic (reflex)     Status: Abnormal   Collection Time: 09/05/18  1:23 PM  Result Value Ref Range   RBC / HPF >50 0 - 5 RBC/hpf   WBC, UA >50 0 - 5 WBC/hpf   Bacteria, UA MANY (A) NONE SEEN   Squamous Epithelial / LPF 6-10 0 - 5  Save smear     Status: None   Collection Time: 09/05/18  1:39 PM  Result Value Ref Range   Smear Review SMEAR STAINED AND AVAILABLE FOR REVIEW   Type and screen [place]     Status: None   Collection Time: 09/05/18  1:41 PM  Result Value Ref Range   ABO/RH(D) A POS    Antibody Screen NEG    Sample Expiration      09/08/2018 Performed at [facility], [place] [address], [place], [place] [address]   ABO/Rh     Status: None (Preliminary result)   Collection Time: 09/05/18  1:41 PM  Result Value Ref Range   ABO/RH(D)      A POS Performed at [facility] [address] [place], [place] [address]    No results found for this or any previous visit (from the past 240 hour(s)). Creatinine: Recent Labs    09/05/18 0834  CREATININE 3.14*   CT scan personally reviewed and is explained in the history of present illness.  There is no obvious lymphadenopathy.  There are no obvious lesions on chest CT as well.  Impression/Assessment:   Left retroperitoneal hematoma Possible left renal mass, benign AML versus renal  cell carcinoma  Plan:  Initially try to treat conservatively with bedrest, serial hemoglobin checks, transfusion as needed.  I have spoken with interventional radiology for the possibility of embolization.  Therefore they are aware of the patient.  If hemoglobin drops rapidly or she begins to decompensate, would recommend urgent embolization.  Open nephrectomy carries the risk of quick hemorrhage and bleeding out once the tamponade effect is relieved.  Recommend every 4 to 6-hour hemoglobin checks and transfuse as needed for hemoglobin less than 7  Marton Redwood, III 09/05/2018, 3:45 PM

## 2018-09-05 NOTE — ED Notes (Signed)
Got patient undress on the monitor patient is resting with family at bedside and call bell in reach 

## 2018-09-05 NOTE — H&P (View-Only) (Signed)
H&P   Physician requesting consult: Lalla Brothers, MD  Chief Complaint: Left retroperitoneal hematoma with hemorrhagic mass  History of Present Illness: 57 year old female with a history of gross hematuria.  She went to get this worked up 1 year ago but her pain improved and she did not follow-[facility].  She presented today with an acute onset of severe left-sided pain that started on Wednesday.  Her pain has progressively worsened.  CT knee without contrast of the abdomen and pelvis was performed which showed a heterogeneous left kidney likely representing a mass with recent or active hemorrhage.  There was surrounding hemorrhagic products.  There was also some calcification present.  There was not a discrete mass that I could see however radiology read mentioned the possibility of renal cell carcinoma.  There was no obvious fat within a mass.  The patient continues to have left-sided abdominal pain.  She has been hypertensive and slightly tachycardic.  Hemoglobin was found to be 8.7.  I do not have a baseline.  She does not know her baseline.  She was also found to have acute renal insufficiency with a creatinine of 3.14.  History reviewed. No pertinent past medical history. Past Surgical History:  Procedure Laterality Date  . CESAREAN SECTION      Home Medications:   (Not in a hospital admission) Allergies: No Known Allergies  No family history on file. Social History:  reports that she has never smoked. She does not have any smokeless tobacco history on file. She reports that she does not drink alcohol or use drugs.  ROS: A complete review of systems was performed.  All systems are negative except for pertinent findings as noted. ROS   Physical Exam:  Vital signs in last 24 hours: Temp:  [98.9 F (37.2 C)] 98.9 F (37.2 C) (09/28 0818) Pulse Rate:  [100-128] 107 (09/28 1515) Resp:  [15-32] 31 (09/28 1515) BP: (142-193)/(93-141) 159/100 (09/28 1515) SpO2:  [90 %-97 %] 95 %  (09/28 1515) General:  Alert and oriented, No acute distress HEENT: Normocephalic, atraumatic Neck: No JVD or lymphadenopathy Cardiovascular: Regular rate and rhythm Lungs: Regular rate and effort Abdomen: Abdomen soft.  She has a palpable mass in the left side.  Her left side is mild to moderately tender to palpation.  She has no flank ecchymosis Back: No CVA tenderness Extremities: No edema Neurologic: Grossly intact  Laboratory Data:  Results for orders placed or performed during the hospital encounter of 09/05/18 (from the past 24 hour(s))  Basic metabolic panel     Status: Abnormal   Collection Time: 09/05/18  8:34 AM  Result Value Ref Range   Sodium 126 (L) 135 - 145 mmol/L   Potassium 4.0 3.5 - 5.1 mmol/L   Chloride 88 (L) 98 - 111 mmol/L   CO2 19 (L) 22 - 32 mmol/L   Glucose, Bld 153 (H) 70 - 99 mg/dL   BUN 50 (H) 6 - 20 mg/dL   Creatinine, Ser 3.14 (H) 0.44 - 1.00 mg/dL   Calcium 9.0 8.9 - 10.3 mg/dL   GFR calc non Af Amer 15 (L) >60 mL/min   GFR calc Af Amer 18 (L) >60 mL/min   Anion gap 19 (H) 5 - 15  CBC     Status: Abnormal   Collection Time: 09/05/18  8:34 AM  Result Value Ref Range   WBC 30.1 (H) 4.0 - 10.5 K/uL   RBC 3.12 (L) 3.87 - 5.11 MIL/uL   Hemoglobin 8.7 (L) 12.0 -  15.0 g/dL   HCT 27.5 (L) 36.0 - 46.0 %   MCV 88.1 78.0 - 100.0 fL   MCH 27.9 26.0 - 34.0 pg   MCHC 31.6 30.0 - 36.0 g/dL   RDW 13.7 11.5 - 15.5 %   Platelets 217 150 - 400 K/uL  I-Stat beta hCG blood, ED     Status: Abnormal   Collection Time: 09/05/18  8:40 AM  Result Value Ref Range   I-stat hCG, quantitative 23.9 (H) <5 mIU/mL   Comment 3          I-stat troponin, ED     Status: None   Collection Time: 09/05/18 10:50 AM  Result Value Ref Range   Troponin i, poc 0.06 0.00 - 0.08 ng/mL   Comment 3          I-Stat CG4 Lactic Acid, ED     Status: Abnormal   Collection Time: 09/05/18 12:00 PM  Result Value Ref Range   Lactic Acid, Venous 2.11 (HH) 0.5 - 1.9 mmol/L   Comment NOTIFIED  PHYSICIAN   Urinalysis, Routine w reflex microscopic- may I&O cath if menses     Status: Abnormal   Collection Time: 09/05/18  1:23 PM  Result Value Ref Range   Color, Urine BROWN (A) YELLOW   APPearance TURBID (A) CLEAR   Specific Gravity, Urine 1.020 1.005 - 1.030   pH 6.5 5.0 - 8.0   Glucose, UA 100 (A) NEGATIVE mg/dL   Hgb urine dipstick LARGE (A) NEGATIVE   Bilirubin Urine MODERATE (A) NEGATIVE   Ketones, ur 15 (A) NEGATIVE mg/dL   Protein, ur >300 (A) NEGATIVE mg/dL   Nitrite POSITIVE (A) NEGATIVE   Leukocytes, UA MODERATE (A) NEGATIVE  Pregnancy, urine     Status: None   Collection Time: 09/05/18  1:23 PM  Result Value Ref Range   Preg Test, Ur NEGATIVE NEGATIVE  Urinalysis, Microscopic (reflex)     Status: Abnormal   Collection Time: 09/05/18  1:23 PM  Result Value Ref Range   RBC / HPF >50 0 - 5 RBC/hpf   WBC, UA >50 0 - 5 WBC/hpf   Bacteria, UA MANY (A) NONE SEEN   Squamous Epithelial / LPF 6-10 0 - 5  Save smear     Status: None   Collection Time: 09/05/18  1:39 PM  Result Value Ref Range   Smear Review SMEAR STAINED AND AVAILABLE FOR REVIEW   Type and screen [place]     Status: None   Collection Time: 09/05/18  1:41 PM  Result Value Ref Range   ABO/RH(D) A POS    Antibody Screen NEG    Sample Expiration      09/08/2018 Performed at [facility], [place] [address] [place], [place] [address]   ABO/Rh     Status: None (Preliminary result)   Collection Time: 09/05/18  1:41 PM  Result Value Ref Range   ABO/RH(D)      A POS Performed at [facility] [address], [place], [place] [address]    No results found for this or any previous visit (from the past 240 hour(s)). Creatinine: Recent Labs    09/05/18 0834  CREATININE 3.14*   CT scan personally reviewed and is explained in the history of present illness.  There is no obvious lymphadenopathy.  There are no obvious lesions on chest CT as well.  Impression/Assessment:   Left retroperitoneal hematoma Possible left renal mass, benign AML versus renal  cell carcinoma  Plan:  Initially try to treat conservatively with bedrest, serial hemoglobin checks, transfusion as needed.  I have spoken with interventional radiology for the possibility of embolization.  Therefore they are aware of the patient.  If hemoglobin drops rapidly or she begins to decompensate, would recommend urgent embolization.  Open nephrectomy carries the risk of quick hemorrhage and bleeding out once the tamponade effect is relieved.  Recommend every 4 to 6-hour hemoglobin checks and transfuse as needed for hemoglobin less than 7  Marton Redwood, III 09/05/2018, 3:45 PM

## 2018-09-05 NOTE — Progress Notes (Signed)
Koleen Distance, MD stated that patient could be NPO with ice chips.

## 2018-09-05 NOTE — H&P (Signed)
Date: 09/05/2018               Patient Name:  Sharon Henson MRN: [id number]  DOB: 1961-12-06 Age / Sex: 57 y.o., female   PCP: Lin Landsman, MD         Medical Service: Internal Medicine Teaching Service         Attending Physician: Dr. Evette Doffing, Mallie Mussel, *    First Contact: Dr. Koleen Distance Pager: [phone]  Second Contact: Dr. Berline Lopes Pager: [phone]       After Hours (After 5p/  First Contact Pager: [phone]  weekends / holidays): Second Contact Pager: [phone]   Chief Complaint: hematuria, abdominal pain  History of Present Illness: Ms. Bertagnolli is 57 y/o otherwise healthy female that presents with 4 day history of acute onset LLQ pain with associated hematuria, nausea, vomiting, fevers and chills. Her pain is severe, constant and non-radiating. It is described as a deep ache. It is worse with movement; no palliating factors.  Reports a history of similar symptoms a year ago. She was referred to urology from urgent care clinic because KUB revealed questionable left renal stone. Urinalysis in at urology office was negative. CT abd/pelvis with and without contrast was ordered to evaluate for abdominal mass or possible stones. Patient states she never had this done because her symptoms had resolved and she did not feel it was necessary.  Up until 4 days ago, patient has felt like her usual self.  Denies weight changes, recent illness, chest pain, palpitations, shortness of breath, focal weakness, numbness/tingling.   Meds: Patient is not on any medications No outpatient medications have been marked as taking for the 09/05/18 encounter [facility] Encounter).     Allergies: Allergies as of 09/05/2018  . (No Known Allergies)   History reviewed. No pertinent past medical history.  Family History: negative for cancer, stroke, CAD   Social History: 35 pack year smoking history, occasional alcohol, no illicit drug use.   Review of Systems: A complete ROS was negative except as  per HPI.  Physical Exam: Blood pressure (!) 167/89, pulse (!) 107, temperature 97.6 F (36.4 C), temperature source Oral, resp. rate 18, SpO2 95 %. General: awake, alert; in moderate distress secondary to pain, ill appearing HEENT: normocephalic, atraumatic; moist mucous membranes Neck: supple, no thyromegaly CV: Tachycardic rate, regular rhythm; no murmurs.  Pulm: No increased work of breathing; lungs CTA bilaterally GI: BS+; abdomen is distended; LLQ and left flank tender to palpation  Ext: no edema Neuro: A&Ox4, no focal deficits  Psych: appropriate mood and affect  Skin: appears pale; warm and dry   EKG: personally reviewed my interpretation is sinus tachycardia   CT abd pelvis without contrast 1. Very large hemorrhagic mass arising from the left kidney 2. High attenuation within the left renal pelvis is indicative of hemorrhage/clot, with probable mild associated left hydronephrosis. 3. Small ascites. 4. Small left pleural effusion. 5.  Aortic atherosclerosis  Assessment & Plan by Problem: Active Problems:   Acute renal injury ([place])  1. Left retroperitoneal hematoma  - possible etiologies include renal cell carcinoma versus angiomyolipoma  - urology on board; recommended conservative management, serial hgb checks and transfusion as needed; also made IR aware for possible embolization if she were to decompensate - currently receiving 2 units of pRBCs after repeat CBC revealed hgb 6.9  - follow-up H&H - dilaudid prn for pain control   2. Elevated creatinine:  - likely obstructive in the setting of above - has  not improved with IVF, making pre-renal less likely    3. Hyponatremia  - did not respond to 3L normal saline - likely due to acute renal failure   4. Leukocytosis, Nitrite positive U/A - patient afebrile and did not endorse urinary symptoms - continuing ceftriaxone given overall clinical status and cannot rule urologic infectious process   Diet: NPO DVT  prophylaxis: SCDs CODE: FULL  Dispo: Admit patient to Inpatient with expected length of stay greater than 2 midnights.  SignedDelice Bison, DO 09/05/2018, 5:42 PM  Pager: [phone]

## 2018-09-05 NOTE — Progress Notes (Signed)
New Admission Note:   Arrival Method: Stretcher Mental Orientation: A&O X4 Telemetry: Initiated Assessment: Completed Skin: WDL IV: WDL Pain:10/10 Tubes: N/A Safety Measures: Safety Fall Prevention Plan has been given, discussed and signed Admission: Completed Unit Orientation: Patient has been orientated to the room, unit and staff.  Family: Husband and son at bedside  Orders have been reviewed and implemented. Will continue to monitor the patient. Call light has been placed within reach and bed alarm has been activated.    Aneta Mins BSN, RN

## 2018-09-05 NOTE — ED Triage Notes (Signed)
Lab unable to obtain Istat lactic Acid

## 2018-09-05 NOTE — Progress Notes (Signed)
CRITICAL VALUE ALERT  Critical Value: Hgb: 6.9  Date & Time Notied: 09/05/18 1700  Provider Notified: Koleen Distance, MD   Orders Received/Actions taken: MD made aware. No further orders. Will continue to monitor.

## 2018-09-06 ENCOUNTER — Encounter (HOSPITAL_COMMUNITY): Payer: Self-pay

## 2018-09-06 DIAGNOSIS — R319 Hematuria, unspecified: Secondary | ICD-10-CM

## 2018-09-06 DIAGNOSIS — K661 Hemoperitoneum: Secondary | ICD-10-CM | POA: Diagnosis present

## 2018-09-06 DIAGNOSIS — R1032 Left lower quadrant pain: Secondary | ICD-10-CM

## 2018-09-06 DIAGNOSIS — D62 Acute posthemorrhagic anemia: Secondary | ICD-10-CM | POA: Diagnosis present

## 2018-09-06 DIAGNOSIS — Z9889 Other specified postprocedural states: Secondary | ICD-10-CM

## 2018-09-06 LAB — BASIC METABOLIC PANEL
ANION GAP: 14 (ref 5–15)
Anion gap: 13 (ref 5–15)
BUN: 58 mg/dL — ABNORMAL HIGH (ref 6–20)
BUN: 61 mg/dL — AB (ref 6–20)
CALCIUM: 7.8 mg/dL — AB (ref 8.9–10.3)
CALCIUM: 7.9 mg/dL — AB (ref 8.9–10.3)
CO2: 18 mmol/L — AB (ref 22–32)
CO2: 18 mmol/L — AB (ref 22–32)
CREATININE: 3.47 mg/dL — AB (ref 0.44–1.00)
CREATININE: 3.44 mg/dL — AB (ref 0.44–1.00)
Chloride: 96 mmol/L — ABNORMAL LOW (ref 98–111)
Chloride: 95 mmol/L — ABNORMAL LOW (ref 98–111)
GFR calc Af Amer: 16 mL/min — ABNORMAL LOW (ref >60)
GFR calc non Af Amer: 14 mL/min — ABNORMAL LOW (ref >60)
GFR, EST AFRICAN AMERICAN: 16 mL/min — AB (ref >60)
GFR, EST NON AFRICAN AMERICAN: 13 mL/min — AB (ref >60)
GLUCOSE: 115 mg/dL — AB (ref 70–99)
Glucose, Bld: 93 mg/dL (ref 70–99)
Potassium: 4.5 mmol/L (ref 3.5–5.1)
Potassium: 4.8 mmol/L (ref 3.5–5.1)
Sodium: 128 mmol/L — ABNORMAL LOW (ref 135–145)
Sodium: 126 mmol/L — ABNORMAL LOW (ref 135–145)

## 2018-09-06 LAB — TYPE AND SCREEN
ABO/RH(D): A POS
Antibody Screen: NEGATIVE
UNIT DIVISION: 0
Unit division: 0

## 2018-09-06 LAB — BPAM RBC
Blood Product Expiration Date: 201910102359
Blood Product Expiration Date: 201910102359
ISSUE DATE / TIME: 201909281817
ISSUE DATE / TIME: 201909282309
UNIT TYPE AND RH: 6200
Unit Type and Rh: 6200

## 2018-09-06 LAB — CBC
HCT: 29.5 % — ABNORMAL LOW (ref 36.0–46.0)
HCT: 29.2 % — ABNORMAL LOW (ref 36.0–46.0)
HEMATOCRIT: 27.6 % — AB (ref 36.0–46.0)
HEMATOCRIT: 28.8 % — AB (ref 36.0–46.0)
Hemoglobin: 9.2 g/dL — ABNORMAL LOW (ref 12.0–15.0)
Hemoglobin: 9.7 g/dL — ABNORMAL LOW (ref 12.0–15.0)
Hemoglobin: 9.6 g/dL — ABNORMAL LOW (ref 12.0–15.0)
Hemoglobin: 9.6 g/dL — ABNORMAL LOW (ref 12.0–15.0)
MCH: 27.9 pg (ref 26.0–34.0)
MCH: 27.6 pg (ref 26.0–34.0)
MCH: 27.6 pg (ref 26.0–34.0)
MCH: 28.1 pg (ref 26.0–34.0)
MCHC: 33.3 g/dL (ref 30.0–36.0)
MCHC: 32.9 g/dL (ref 30.0–36.0)
MCHC: 32.9 g/dL (ref 30.0–36.0)
MCHC: 33.3 g/dL (ref 30.0–36.0)
MCV: 83.6 fL (ref 78.0–100.0)
MCV: 83.8 fL (ref 78.0–100.0)
MCV: 83.9 fL (ref 78.0–100.0)
MCV: 84.2 fL (ref 78.0–100.0)
PLATELETS: 181 10*3/uL (ref 150–400)
PLATELETS: 202 10*3/uL (ref 150–400)
PLATELETS: 184 10*3/uL (ref 150–400)
Platelets: 192 10*3/uL (ref 150–400)
RBC: 3.3 MIL/uL — ABNORMAL LOW (ref 3.87–5.11)
RBC: 3.52 MIL/uL — ABNORMAL LOW (ref 3.87–5.11)
RBC: 3.48 MIL/uL — AB (ref 3.87–5.11)
RBC: 3.42 MIL/uL — ABNORMAL LOW (ref 3.87–5.11)
RDW: 13.6 % (ref 11.5–15.5)
RDW: 14.4 % (ref 11.5–15.5)
RDW: 14.4 % (ref 11.5–15.5)
RDW: 14.6 % (ref 11.5–15.5)
WBC: 16.2 10*3/uL — AB (ref 4.0–10.5)
WBC: 17.1 10*3/uL — ABNORMAL HIGH (ref 4.0–10.5)
WBC: 14.7 10*3/uL — AB (ref 4.0–10.5)
WBC: 15.1 10*3/uL — ABNORMAL HIGH (ref 4.0–10.5)

## 2018-09-06 LAB — APTT: aPTT: 31 seconds (ref 24–36)

## 2018-09-06 LAB — URINALYSIS, ROUTINE W REFLEX MICROSCOPIC
GLUCOSE, UA: 100 mg/dL — AB
Nitrite: POSITIVE — AB
PH: 6.5 (ref 5.0–8.0)
Protein, ur: >300 mg/dL — AB
SPECIFIC GRAVITY, URINE: 1.015 (ref 1.005–1.030)

## 2018-09-06 LAB — URINE CULTURE: CULTURE: NO GROWTH

## 2018-09-06 LAB — URINALYSIS, MICROSCOPIC (REFLEX)

## 2018-09-06 LAB — PROTEIN / CREATININE RATIO, URINE
CREATININE, URINE: 67.82 mg/dL
TOTAL PROTEIN, URINE: 264 mg/dL

## 2018-09-06 LAB — HIV ANTIBODY (ROUTINE TESTING W REFLEX): HIV Screen 4th Generation wRfx: NONREACTIVE

## 2018-09-06 LAB — PROTIME-INR
INR: 1.25
Prothrombin Time: 15.6 seconds — ABNORMAL HIGH (ref 11.4–15.2)

## 2018-09-06 LAB — NA AND K (SODIUM & POTASSIUM), RAND UR
Potassium Urine: 32 mmol/L
Sodium, Ur: 43 mmol/L

## 2018-09-06 LAB — CREATININE, URINE, RANDOM: CREATININE, URINE: 67.54 mg/dL

## 2018-09-06 MED ORDER — SODIUM CHLORIDE 0.9 % IV SOLN
INTRAVENOUS | Status: DC
  Administered 2018-09-06 – 2018-09-09 (×4): via INTRAVENOUS

## 2018-09-06 MED ORDER — ONDANSETRON HCL 4 MG/2ML IJ SOLN
4.0000 mg | Freq: Four times a day (QID) | INTRAMUSCULAR | Status: DC | PRN
  Administered 2018-09-06: 4 mg via INTRAVENOUS
  Filled 2018-09-06: qty 2

## 2018-09-06 NOTE — Progress Notes (Addendum)
Urology Inpatient Progress Report     Intv/Subj: No acute events overnight. Patient is without complaint.  Pain has improved significantly.  Hemoglobin is now stable status post 2 units of PRBC.  Hemoglobin at 4 AM was 9.2.  Repeat at 9:10 AM was 9.7.  Creatinine remains elevated at 3.47 which is up from 3.21.   Principal Problem:   Retroperitoneal hematoma Active Problems:   Left renal mass   Pilar Plate hematuria   Acute renal failure (HCC)   Acute blood loss anemia  Current Facility-Administered Medications  Medication Dose Route Frequency Provider Last Rate Last Dose  . 0.9 %  sodium chloride infusion   Intravenous Continuous Kathi Ludwig, MD 75 mL/hr at 09/06/18 0904    . cefTRIAXone (ROCEPHIN) 1 g in sodium chloride 0.9 % 100 mL IVPB  1 g Intravenous Q24H Kathi Ludwig, MD 200 mL/hr at 09/06/18 0904 1 g at 09/06/18 0904  . HYDROmorphone (DILAUDID) injection 1 mg  1 mg Intravenous Q2H PRN Kathi Ludwig, MD   1 mg at 09/06/18 1134     Objective: Vital: Vitals:   09/05/18 2335 09/06/18 0245 09/06/18 0511 09/06/18 0821  BP: (!) 156/91 (!) 163/97 (!) 155/93 (!) 171/97  Pulse: 91 92 91 89  Resp: 18 20 18 18   Temp: 98.7 F (37.1 C) 98.4 F (36.9 C) 98 F (36.7 C) (!) 97.5 F (36.4 C)  TempSrc: Oral Oral Oral Oral  SpO2:  98% 98% 99%  Weight:      Height:       I/Os: I/O last 3 completed shifts: In: 9371 [P.O.:360; I.V.:1150; Blood:630; IV Piggyback:2100] Out: 425 [Urine:425]  Physical Exam:  General: Patient is in no apparent distress Lungs: Normal respiratory effort, chest expands symmetrically. GI:  The abdomen is soft but mildly distended with palpable mass in the left abdomen Ext: lower extremities symmetric  Lab Results: Recent Labs    09/05/18 1619 09/06/18 0409 09/06/18 0910  WBC 22.0* 16.2* 17.1*  HGB 6.9* 9.2* 9.7*  HCT 21.2* 27.6* 29.5*   Recent Labs    09/05/18 0834 09/05/18 1619 09/06/18 0409  NA 126* 126* 128*  K 4.0 4.0  4.5  CL 88* 93* 96*  CO2 19* 19* 18*  GLUCOSE 153* 114* 115*  BUN 50* 53* 58*  CREATININE 3.14* 3.21* 3.47*  CALCIUM 9.0 7.7* 7.8*   No results for input(s): LABPT, INR in the last 72 hours. No results for input(s): LABURIN in the last 72 hours. Results for orders placed or performed during the hospital encounter of 09/05/18  Blood Culture (routine x 2)     Status: None (Preliminary result)   Collection Time: 09/05/18  9:44 AM  Result Value Ref Range Status   Specimen Description BLOOD WEAKLY REACTIVE LEFT  Final   Special Requests   Final    BOTTLES DRAWN AEROBIC ONLY Blood Culture results may not be optimal due to an inadequate volume of blood received in culture bottles   Culture   Final    NO GROWTH 1 DAY Performed at [facility] [address] [place], [place] [address]    Report Status PENDING  Incomplete  Blood Culture (routine x 2)     Status: None (Preliminary result)   Collection Time: 09/05/18  9:49 AM  Result Value Ref Range Status   Specimen Description BLOOD LEFT HAND  Final   Special Requests   Final    BOTTLES DRAWN AEROBIC ONLY Blood Culture results may not be optimal due to  an inadequate volume of blood received in culture bottles   Culture   Final    NO GROWTH 1 DAY Performed at [facility], [place] [address] [place], [place] [address]    Report Status PENDING  Incomplete  Urine C&S     Status: None   Collection Time: 09/05/18  1:22 PM  Result Value Ref Range Status   Specimen Description URINE, RANDOM  Final   Special Requests NONE  Final   Culture   Final    NO GROWTH Performed at [facility], [place] [address], [place], [place] [address]    Report Status 09/06/2018 FINAL  Final    Studies/Results: Ct Chest Wo Contrast  Result Date: 09/05/2018 CLINICAL DATA:  Low back pain, nausea and vomiting. Evaluate for lymphadenopathy. Hemorrhagic renal mass seen on abdominal CT from earlier today. EXAM: CT CHEST WITHOUT CONTRAST TECHNIQUE:  Multidetector CT imaging of the chest was performed following the standard protocol without IV contrast. COMPARISON:  CT abdomen pelvis - earlier same day FINDINGS: Cardiovascular: Mild fusiform ectasia of the ascending thoracic aorta measuring 38 mm in diameter (coronal image 38, series 6). Scattered minimal atherosclerotic plaque within the aortic arch and descending thoracic aorta. The left vertebral artery appears to arise directly from the aortic arch. Borderline cardiomegaly. Trace pericardial fluid. Coronary artery calcifications. Normal caliber the main pulmonary artery. Mediastinum/Nodes: Scattered mediastinal lymph nodes are not enlarged by size criteria with index precarinal lymph node measuring 0.7 cm in greatest short axis diameter (image 48, series 3 and index AP window lymph node measuring 0.8 cm (43, series 3). No bulky mediastinal, hilar axillary lymphadenopathy on this noncontrast examination. Lungs/Pleura: Trace left-sided pleural effusion with associated dependent subpleural ground-glass atelectasis. Minimal right basilar atelectasis. No discrete focal airspace opacities. No pneumothorax. Punctate (approximately 3 mm) noncalcified nodule within the peripheral aspect the left upper lobe (image 63, series 4). The central pulmonary airways appear widely patent. Upper Abdomen: Limited noncontrast evaluation of the upper abdomen demonstrates the cranial aspect of known hemorrhagic left renal mass with associated minimal amount of fluid/hemorrhage within the left upper quadrant as better demonstrated on preceding dedicated abdominal and pelvic CT. Musculoskeletal: No acute or aggressive osseous abnormalities. Post bilateral breast augmentation. Regional soft tissues appear normal. IMPRESSION: 1. No definite evidence of advanced metastatic disease to the chest. 2. Indeterminate punctate (3 mm) left upper lobe pulmonary nodule. Given the patient's indeterminate hemorrhagic left renal mass, a follow-up  non-contrast chest CT can be considered in 12 months is recommended. This recommendation follows the consensus statement: Guidelines for Management of Incidental Pulmonary Nodules Detected on CT Images: From the Fleischner Society 2017; Radiology 2017; 284:228-243. 3. Small potentially reactive left-sided pleural effusion. 4.  Aortic Atherosclerosis (ICD10-I70.0). Electronically Signed   By: Sandi Mariscal M.D.   On: 09/05/2018 13:01   Ct Renal Stone Study  Result Date: 09/05/2018 CLINICAL DATA:  Fever and nausea since Wednesday. EXAM: CT ABDOMEN AND PELVIS WITHOUT CONTRAST TECHNIQUE: Multidetector CT imaging of the abdomen and pelvis was performed following the standard protocol without IV contrast. COMPARISON:  None. FINDINGS: Lower chest: Small left pleural effusion with mild compressive atelectasis in the left lower lobe. Heart size normal. No pericardial effusion. Hepatobiliary: Liver and gallbladder are unremarkable. No biliary ductal dilatation. Pancreas: Negative. Spleen: Negative. Adrenals/Urinary Tract: Adrenal glands and right kidney are unremarkable. There is a very large heterogeneous mass with internal hemorrhage arising from the left kidney, measuring 12.4 x 15.6 cm. Perianal stranding and fluid  on the left. Hemorrhage is seen in the left renal pelvis. There may be mild left hydronephrosis. Ureters are decompressed. Bladder is very low in volume. Stomach/Bowel: Small hiatal hernia. Stomach, small bowel and colon are unremarkable. Favor fluid-filled small bowel in the dependent anatomic pelvis over free fluid. Vascular/Lymphatic: Atherosclerotic calcification of the arterial vasculature without abdominal aortic aneurysm. No definite pathologically enlarged lymph nodes. Reproductive: Uterus is visualized.  No adnexal mass. Other: Small ascites. Musculoskeletal: No worrisome lytic or sclerotic lesions. IMPRESSION: 1. Very large hemorrhagic mass arising from the left kidney, highly worrisome for renal  cell carcinoma. These results were called by telephone at the time of interpretation on 09/05/2018 at 10:34 am to Dr. Jola Schmidt , who verbally acknowledged these results. 2. High attenuation within the left renal pelvis is indicative of hemorrhage/clot, with probable mild associated left hydronephrosis. 3. Small ascites. 4. Small left pleural effusion. 5.  Aortic atherosclerosis (ICD10-170.0). Electronically Signed   By: Lorin Picket M.D.   On: 09/05/2018 10:34    Assessment: Retroperitoneal bleed likely secondary to renal mass, RCC versus AML Gross hematuria secondary to the above Acute renal insufficiency  Plan: Can extend out hemoglobin checks to every 12 hours.  If stable by tomorrow, extend out to every day while hospitalized.  May come off bedrest.  Okay to eat from my standpoint.  Manage conservatively at this point.  She will need to follow-up outpatient with plans for repeat CT versus planning for nephrectomy.  She is still deciding whether to follow-up with [facility] or [facility].  Acute renal insufficiency plan per the hospitalist team.  Anticipate gross hematuria will be intermittent as long as the renal mass is present.  Agree with continuing antibiotics for possible superimposed infection.   Link Snuffer, MD Urology 09/06/2018, 12:40 PM

## 2018-09-06 NOTE — Progress Notes (Signed)
   Subjective: Sharon Henson was seen and evaluated at bedside on morning rounds. She remained hemodynamically stable overnight. She is s/p 2 units of pRBCs. She still complains of significant abdominal pain which is improved with dilaudid. States she is making urine with continued hematuria. Denies chest pain, light headedness, or palpitations.  No personal or family history of bleeding disorders.   Objective:  Vital signs in last 24 hours: Vitals:   09/05/18 2335 09/06/18 0245 09/06/18 0511 09/06/18 0821  BP: (!) 156/91 (!) 163/97 (!) 155/93 (!) 171/97  Pulse: 91 92 91 89  Resp: 18 20 18 18   Temp: 98.7 F (37.1 C) 98.4 F (36.9 C) 98 F (36.7 C) (!) 97.5 F (36.4 C)  TempSrc: Oral Oral Oral Oral  SpO2:  98% 98% 99%  Weight:      Height:       General: awake, alert, lying in bed, appears uncomfortable GI: abdomen is distended with TTP in LLQ and flank with large palpable mass Ext: no edema   Assessment/Plan:  Principal Problem:   Retroperitoneal hematoma Active Problems:   Left renal mass   Pilar Plate hematuria   Acute renal failure (HCC)   Acute blood loss anemia  1. Acute blood loss anemia 2/2 retroperitoneal hematoma - patient is hemodynamically stable - bleeding seems to have resolved on its own - s/p 2 units pRBCs; Hgb improved from 6.9 to 9.2 this morning - CBCs q 6 hrs - urology on board and appreciate their recommendations - etiology of renal mass will need to be worked up once her acute issues resolve  - PT and PTT ordered to check for acquired coagulopathy   2. Acute renal failure - possibly ATN in the setting of acute anemia; urine microscopy pending to evaluate for casts - U/A with proteinuria; urine P/C pending - have also ordered urine electrolytes to further evaluate AKI  - patient is making urine; she has an anion gap metabolic acidosis with uremia. She received over 3L of fluid in the ED and appears euvolemic on exam. We will continue maintentance IV fluids  and monitor closely; if renal function demonstrates no improvement will consult nephrology   3. UTI - patient denied urinary symptoms  - U/A positive for leukocytes and nitrites; CBC with leukocytosis  - will continue ceftriaxone to treat possible infection    Dispo: Anticipated discharge in approximately 3-4 day(s).   Modena Nunnery D, DO 09/06/2018, 11:22 AM Pager: [phone]

## 2018-09-07 LAB — BASIC METABOLIC PANEL
Anion gap: 12 (ref 5–15)
BUN: 65 mg/dL — ABNORMAL HIGH (ref 6–20)
CALCIUM: 8.1 mg/dL — AB (ref 8.9–10.3)
CO2: 17 mmol/L — ABNORMAL LOW (ref 22–32)
CREATININE: 3.79 mg/dL — AB (ref 0.44–1.00)
Chloride: 98 mmol/L (ref 98–111)
GFR, EST AFRICAN AMERICAN: 14 mL/min — AB (ref >60)
GFR, EST NON AFRICAN AMERICAN: 12 mL/min — AB (ref >60)
Glucose, Bld: 89 mg/dL (ref 70–99)
Potassium: 4.5 mmol/L (ref 3.5–5.1)
SODIUM: 127 mmol/L — AB (ref 135–145)

## 2018-09-07 LAB — CBC
HCT: 26.8 % — ABNORMAL LOW (ref 36.0–46.0)
HCT: 27.6 % — ABNORMAL LOW (ref 36.0–46.0)
HEMOGLOBIN: 8.7 g/dL — AB (ref 12.0–15.0)
Hemoglobin: 9 g/dL — ABNORMAL LOW (ref 12.0–15.0)
MCH: 27.3 pg (ref 26.0–34.0)
MCH: 27.5 pg (ref 26.0–34.0)
MCHC: 32.5 g/dL (ref 30.0–36.0)
MCHC: 32.6 g/dL (ref 30.0–36.0)
MCV: 84 fL (ref 78.0–100.0)
MCV: 84.4 fL (ref 78.0–100.0)
PLATELETS: 226 10*3/uL (ref 150–400)
Platelets: 188 10*3/uL (ref 150–400)
RBC: 3.19 MIL/uL — ABNORMAL LOW (ref 3.87–5.11)
RBC: 3.27 MIL/uL — AB (ref 3.87–5.11)
RDW: 14.5 % (ref 11.5–15.5)
RDW: 14.4 % (ref 11.5–15.5)
WBC: 13.7 10*3/uL — ABNORMAL HIGH (ref 4.0–10.5)
WBC: 14.4 10*3/uL — ABNORMAL HIGH (ref 4.0–10.5)

## 2018-09-07 LAB — PATHOLOGIST SMEAR REVIEW

## 2018-09-07 LAB — OSMOLALITY, URINE: OSMOLALITY UR: 246 mosm/kg — AB (ref 300–900)

## 2018-09-07 LAB — OSMOLALITY: Osmolality: 290 mOsm/kg (ref 275–295)

## 2018-09-07 MED ORDER — SENNOSIDES-DOCUSATE SODIUM 8.6-50 MG PO TABS
1.0000 | ORAL_TABLET | Freq: Every evening | ORAL | Status: DC | PRN
  Administered 2018-09-19 – 2018-09-27 (×4): 1 via ORAL
  Filled 2018-09-07 (×4): qty 1

## 2018-09-07 MED ORDER — SIMETHICONE 80 MG PO CHEW
80.0000 mg | CHEWABLE_TABLET | Freq: Four times a day (QID) | ORAL | Status: DC | PRN
  Administered 2018-09-07 – 2018-09-27 (×49): 80 mg via ORAL
  Filled 2018-09-07 (×54): qty 1

## 2018-09-07 NOTE — Progress Notes (Addendum)
Urology Inpatient Progress Report  Dehydration [E86.0] Left renal mass [N28.89] Pilar Plate hematuria [R31.0] Sepsis, due to unspecified organism [A41.9] Acute renal failure, unspecified acute renal failure type ([place]) [N17.9]        Intv/Subj: No acute events overnight. Patient is without complaint. Pain continues to improve.  Hemoglobin is stable.  Unfortunately, creatinine continues to worsen. She states that her urine is now clear.  Hematuria is resolving.  She states she is making good urine.  Principal Problem:   Retroperitoneal hematoma Active Problems:   Left renal mass   Pilar Plate hematuria   Acute renal failure (HCC)   Acute blood loss anemia  Current Facility-Administered Medications  Medication Dose Route Frequency Provider Last Rate Last Dose  . 0.9 %  sodium chloride infusion   Intravenous Continuous Kathi Ludwig, MD 75 mL/hr at 09/06/18 2310    . HYDROmorphone (DILAUDID) injection 1 mg  1 mg Intravenous Q2H PRN Kathi Ludwig, MD   1 mg at 09/07/18 1514  . ondansetron (ZOFRAN) injection 4 mg  4 mg Intravenous Q6H PRN Bloomfield, Carley D, DO   4 mg at 09/06/18 1444  . senna-docusate (Senokot-S) tablet 1 tablet  1 tablet Oral QHS PRN Kalman Shan Ratliff, DO      . simethicone (MYLICON) chewable tablet 80 mg  80 mg Oral Q6H PRN Kalman Shan Ratliff, DO   80 mg at 09/07/18 1101     Objective: Vital: Vitals:   09/06/18 1808 09/06/18 2112 09/07/18 0455 09/07/18 1121  BP: (!) 180/95 (!) 166/99 (!) 173/95 (!) 169/100  Pulse: 93 92 94 98  Resp: 18 19 19 18   Temp: 98.4 F (36.9 C) 98.6 F (37 C) 97.8 F (36.6 C) 97.8 F (36.6 C)  TempSrc: Oral Oral Oral Oral  SpO2: 99% 100% 100% (!) 70%  Weight:      Height:       I/Os: I/O last 3 completed shifts: In: 2952.6 [P.O.:560; I.V.:1662.6; Blood:630; IV Piggyback:100] Out: 1325 [Urine:1325]  Physical Exam:  General: Patient is in no apparent distress Lungs: Normal respiratory effort, chest expands  symmetrically. GI:The abdomen is soft and nontender without mass. GU:The abdomen is soft but mildly distended with palpable mass in the left abdomen Ext: lower extremities symmetric  Lab Results: Recent Labs    09/06/18 2037 09/07/18 0430 09/07/18 1425  WBC 15.1* 13.7* 14.4*  HGB 9.6* 8.7* 9.0*  HCT 28.8* 26.8* 27.6*   Recent Labs    09/06/18 0409 09/06/18 1648 09/07/18 0430  NA 128* 126* 127*  K 4.5 4.8 4.5  CL 96* 95* 98  CO2 18* 18* 17*  GLUCOSE 115* 93 89  BUN 58* 61* 65*  CREATININE 3.47* 3.44* 3.79*  CALCIUM 7.8* 7.9* 8.1*   Recent Labs    09/06/18 1519  INR 1.25   No results for input(s): LABURIN in the last 72 hours. Results for orders placed or performed during the hospital encounter of 09/05/18  Blood Culture (routine x 2)     Status: None (Preliminary result)   Collection Time: 09/05/18  9:44 AM  Result Value Ref Range Status   Specimen Description BLOOD WEAKLY REACTIVE LEFT  Final   Special Requests   Final    BOTTLES DRAWN AEROBIC ONLY Blood Culture results may not be optimal due to an inadequate volume of blood received in culture bottles   Culture   Final    NO GROWTH 2 DAYS Performed at [facility] [address] [place], [place] [address]  Report Status PENDING  Incomplete  Blood Culture (routine x 2)     Status: None (Preliminary result)   Collection Time: 09/05/18  9:49 AM  Result Value Ref Range Status   Specimen Description BLOOD LEFT HAND  Final   Special Requests   Final    BOTTLES DRAWN AEROBIC ONLY Blood Culture results may not be optimal due to an inadequate volume of blood received in culture bottles   Culture   Final    NO GROWTH 2 DAYS Performed at [facility], [place] [address] [place], [place] [address]    Report Status PENDING  Incomplete  Urine C&S     Status: None   Collection Time: 09/05/18  1:22 PM  Result Value Ref Range Status   Specimen Description URINE, RANDOM  Final   Special Requests NONE  Final    Culture   Final    NO GROWTH Performed at [facility], [place] [address], [place], [place] [address]    Report Status 09/06/2018 FINAL  Final    Studies/Results: No results found.  Assessment: Left retroperitoneal hematoma secondary to renal mass Acute renal insufficiency  Plan: Agree with nephrology consultation.  Hopefully, the patient's renal function will improve.  If he gets back to baseline, ideally would perform a CT with and without contrast to fully evaluate the renal mass.  This can be done outpatient.  Her bleeding appears to have resolved.  Can just observe this.  No urological intervention during this hospitalization.  Can stop checking hemoglobin from my standpoint.  Certainly no more than every 24 hours should be necessary unless there is clinical change.   Link Snuffer, MD Urology 09/07/2018, 5:20 PM

## 2018-09-07 NOTE — Progress Notes (Signed)
   Subjective: Sharon Henson was seen and evaluated on morning rounds. She complains of continued bloating and abdominal pain. She is passing some gas, but no BM. Also endorses nausea and decreased appetite. No vomiting. She is making urine and notes that the amount of blood in her urine has decreased.   Objective:  Vital signs in last 24 hours: Vitals:   09/06/18 0821 09/06/18 1808 09/06/18 2112 09/07/18 0455  BP: (!) 171/97 (!) 180/95 (!) 166/99 (!) 173/95  Pulse: 89 93 92 94  Resp: 18 18 19 19   Temp: (!) 97.5 F (36.4 C) 98.4 F (36.9 C) 98.6 F (37 C) 97.8 F (36.6 C)  TempSrc: Oral Oral Oral Oral  SpO2: 99% 99% 100% 100%  Weight:      Height:       General: awake, alert, appears uncomfortable GI: Abdomen distended; tenderness throughout lower abdomen.  Ext: no edema  Assessment/Plan:  Principal Problem:   Retroperitoneal hematoma Active Problems:   Left renal mass   Pilar Plate hematuria   Acute renal failure (HCC)   Acute blood loss anemia  1. Acute blood loss anemia 2/2 retroperitoneal hematoma - patient is hemodynamically stable - Hgb down to 8.7 this morning from 9.6; repeat CBC this afternoon; transfuse as needed to keep hgb >7. she has received 2 units of blood thus far - PT and PTT were normal  - urology recommended outpatient follow-up with repeat CT versus nephrectomy   2. Acute renal failure - urine output ~1L yesterday; BUN and Crt continue to rise despite IVF.  - possibly ATN in the setting of acute anemia. However, no casts on microscopy and UPC of 3.9 indicating nephrotic range proteinuria. Albumin low at 2.6, but no peripheral edema.  - Nephrology on board and appreciate their recommendations.   3. UTI - urine culture negative; d/c ceftriaxone    Dispo: Anticipated discharge in approximately 2-3 day(s).   Modena Nunnery D, DO 09/07/2018, 10:35 AM Pager: [phone]

## 2018-09-07 NOTE — Consult Note (Signed)
[facility] Renal Consultation Note  Requesting MD:  Dr. Evette Doffing Indication for Consultation: AKI  HPI: 79yoF with no PMH who prestned 9/28 with several day history of LLQ pain, hematuria, N/V, fevers and chills and was found to have L renal mass with associated hemorrhage.  On admission Hb 6.9 for which she rec'd 2u pRBC.  She initially rec'd ceftriaxone for presumed UTI, but urine culture was negative and this was discontinued. On admission Na 126, bicarb 19, Cr 3.2.  She rec'd IV hydration and net I/Os for the admission are +4.8L.  Her creatine has remained in the mid 3s, sodium today 127.   UOP 425 on 9/28 > 1025 9/29.     Urology has consulted and will plan to f/u in clinic regarding next steps - reimage versus plan for nephrectomy.   She states her urine is clearing.  She is having gas and bloating, decreased appetite.  She is not taking copious fluids. She has no h/o HTN and agrees HTN here is likely due to pain and anxiety.   Creatinine, Ser  Date/Time Value Ref Range Status  09/07/2018 04:30 AM 3.79 (H) 0.44 - 1.00 mg/dL Final  09/06/2018 04:48 PM 3.44 (H) 0.44 - 1.00 mg/dL Final  09/06/2018 04:09 AM 3.47 (H) 0.44 - 1.00 mg/dL Final  09/05/2018 04:19 PM 3.21 (H) 0.44 - 1.00 mg/dL Final  09/05/2018 08:34 AM 3.14 (H) 0.44 - 1.00 mg/dL Final     PMHx:  History reviewed. No pertinent past medical history.  Past Surgical History:  Procedure Laterality Date  . CESAREAN SECTION      Family Hx: No family history on file.  Social History:  reports that she has quit smoking. Her smoking use included cigarettes. She has never used smokeless tobacco. She reports that she drinks alcohol. She reports that she does not use drugs.  Allergies: No Known Allergies  Medications: Prior to Admission medications   Not on File    I have reviewed the patient's current medications.  Labs:  Results for orders placed or performed during the hospital encounter of 09/05/18 (from  the past 48 hour(s))  Urine C&S     Status: None   Collection Time: 09/05/18  1:22 PM  Result Value Ref Range   Specimen Description URINE, RANDOM    Special Requests NONE    Culture      NO GROWTH Performed at [facility], [address], [place], [place] [address]    Report Status 09/06/2018 FINAL   Urinalysis, Routine w reflex microscopic- may I&O cath if menses     Status: Abnormal   Collection Time: 09/05/18  1:23 PM  Result Value Ref Range   Color, Urine BROWN (A) YELLOW    Comment: BIOCHEMICALS MAY BE AFFECTED BY COLOR   APPearance TURBID (A) CLEAR   Specific Gravity, Urine 1.020 1.005 - 1.030   pH 6.5 5.0 - 8.0   Glucose, UA 100 (A) NEGATIVE mg/dL   Hgb urine dipstick LARGE (A) NEGATIVE   Bilirubin Urine MODERATE (A) NEGATIVE   Ketones, ur 15 (A) NEGATIVE mg/dL   Protein, ur >300 (A) NEGATIVE mg/dL   Nitrite POSITIVE (A) NEGATIVE   Leukocytes, UA MODERATE (A) NEGATIVE    Comment: Performed at [facility] [address] [place], [place] [address]  Pregnancy, urine     Status: None   Collection Time: 09/05/18  1:23 PM  Result Value Ref Range   Preg Test, Ur NEGATIVE NEGATIVE  Comment:        THE SENSITIVITY OF THIS METHODOLOGY IS >20 mIU/mL. Performed at [facility], [place] [address], [place], [place] [address]   Urinalysis, Microscopic (reflex)     Status: Abnormal   Collection Time: 09/05/18  1:23 PM  Result Value Ref Range   RBC / HPF >50 0 - 5 RBC/hpf   WBC, UA >50 0 - 5 WBC/hpf   Bacteria, UA MANY (A) NONE SEEN   Squamous Epithelial / LPF 6-10 0 - 5    Comment: Performed at [facility], [place] [address], [place], [place] [address]  Save smear     Status: None   Collection Time: 09/05/18  1:39 PM  Result Value Ref Range   Smear Review SMEAR STAINED AND AVAILABLE FOR REVIEW     Comment: Performed at [facility], [place] [address] [place], [place] [address]  Type and screen [place]     Status: None    Collection Time: 09/05/18  1:41 PM  Result Value Ref Range   ABO/RH(D) A POS    Antibody Screen NEG    Sample Expiration 09/08/2018    Unit Number [id number]    Blood Component Type RBC LR PHER2    Unit division 00    Status of Unit ISSUED,FINAL    Transfusion Status OK TO TRANSFUSE    Crossmatch Result      Compatible Performed at [facility], [place] [address] [place], [place] [address]    Unit Number [id number]    Blood Component Type RED CELLS,LR    Unit division 00    Status of Unit ISSUED,FINAL    Transfusion Status OK TO TRANSFUSE    Crossmatch Result Compatible   ABO/Rh     Status: None   Collection Time: 09/05/18  1:41 PM  Result Value Ref Range   ABO/RH(D)      A POS Performed at [facility], [place] [address], [place], [place] [address]   HIV antibody (Routine Testing)     Status: None   Collection Time: 09/05/18  4:19 PM  Result Value Ref Range   HIV Screen 4th Generation wRfx Non Reactive Non Reactive    Comment: (NOTE) Performed At: [facility] [place], [place] [id number] Rush Farmer MD [id number]   Comprehensive metabolic panel     Status: Abnormal   Collection Time: 09/05/18  4:19 PM  Result Value Ref Range   Sodium 126 (L) 135 - 145 mmol/L   Potassium 4.0 3.5 - 5.1 mmol/L   Chloride 93 (L) 98 - 111 mmol/L   CO2 19 (L) 22 - 32 mmol/L   Glucose, Bld 114 (H) 70 - 99 mg/dL   BUN 53 (H) 6 - 20 mg/dL   Creatinine, Ser 3.21 (H) 0.44 - 1.00 mg/dL   Calcium 7.7 (L) 8.9 - 10.3 mg/dL   Total Protein 5.8 (L) 6.5 - 8.1 g/dL   Albumin 2.6 (L) 3.5 - 5.0 g/dL   AST 160 (H) 15 - 41 U/L   ALT 10 0 - 44 U/L   Alkaline Phosphatase 72 38 - 126 U/L   Total Bilirubin 1.0 0.3 - 1.2 mg/dL   GFR calc non Af Amer 15 (L) >60 mL/min   GFR calc Af Amer 17 (L) >60 mL/min    Comment: (NOTE) The eGFR has been calculated using the CKD EPI equation. This calculation has not been validated in all clinical  situations. eGFR's  persistently <60 mL/min signify possible Chronic Kidney Disease.    Anion gap 14 5 - 15    Comment: Performed at [facility] [address] [place], [place] [address]  CBC     Status: Abnormal   Collection Time: 09/05/18  4:19 PM  Result Value Ref Range   WBC 22.0 (H) 4.0 - 10.5 K/uL   RBC 2.47 (L) 3.87 - 5.11 MIL/uL   Hemoglobin 6.9 (LL) 12.0 - 15.0 g/dL    Comment: REPEATED TO VERIFY A MITCHELL RN @ 1701 ON 09/05/18 BY HTEMOCHE    HCT 21.2 (L) 36.0 - 46.0 %   MCV 85.8 78.0 - 100.0 fL   MCH 27.9 26.0 - 34.0 pg   MCHC 32.5 30.0 - 36.0 g/dL   RDW 13.6 11.5 - 15.5 %   Platelets 185 150 - 400 K/uL    Comment: Performed at [facility], [place] [address], [place], [place] [address]  Pathologist smear review     Status: None   Collection Time: 09/05/18  4:19 PM  Result Value Ref Range   Path Review Leukocytosis with mild left shift      Comment: Anemia Reviewed by Lennox Solders. Lyndon Code, M.D. 09/07/18 Performed at [facility], [place] [address], [place], [place] [address]   I-Stat CG4 Lactic Acid, ED     Status: None   Collection Time: 09/05/18  4:27 PM  Result Value Ref Range   Lactic Acid, Venous 1.84 0.5 - 1.9 mmol/L  Prepare RBC     Status: None   Collection Time: 09/05/18  5:25 PM  Result Value Ref Range   Order Confirmation      ORDER PROCESSED BY BLOOD BANK Performed at [facility], [place] [address], [place], [place] [address]   CBC     Status: Abnormal   Collection Time: 09/06/18  4:09 AM  Result Value Ref Range   WBC 16.2 (H) 4.0 - 10.5 K/uL   RBC 3.30 (L) 3.87 - 5.11 MIL/uL   Hemoglobin 9.2 (L) 12.0 - 15.0 g/dL    Comment: POST TRANSFUSION SPECIMEN   HCT 27.6 (L) 36.0 - 46.0 %   MCV 83.6 78.0 - 100.0 fL   MCH 27.9 26.0 - 34.0 pg   MCHC 33.3 30.0 - 36.0 g/dL   RDW 13.6 11.5 - 15.5 %   Platelets 181 150 - 400 K/uL    Comment: Performed at [facility], [place] [address], [place], [place] [address]  Basic metabolic panel     Status: Abnormal    Collection Time: 09/06/18  4:09 AM  Result Value Ref Range   Sodium 128 (L) 135 - 145 mmol/L   Potassium 4.5 3.5 - 5.1 mmol/L   Chloride 96 (L) 98 - 111 mmol/L   CO2 18 (L) 22 - 32 mmol/L   Glucose, Bld 115 (H) 70 - 99 mg/dL   BUN 58 (H) 6 - 20 mg/dL   Creatinine, Ser 3.47 (H) 0.44 - 1.00 mg/dL   Calcium 7.8 (L) 8.9 - 10.3 mg/dL   GFR calc non Af Amer 13 (L) >60 mL/min   GFR calc Af Amer 16 (L) >60 mL/min    Comment: (NOTE) The eGFR has been calculated using the CKD EPI equation. This calculation has not been validated in all clinical situations. eGFR's persistently <60 mL/min signify possible Chronic Kidney Disease.    Anion gap 14 5 - 15    Comment: Performed  at [facility], [place] [address] [place], [place] [address]  CBC     Status: Abnormal   Collection Time: 09/06/18  9:10 AM  Result Value Ref Range   WBC 17.1 (H) 4.0 - 10.5 K/uL   RBC 3.52 (L) 3.87 - 5.11 MIL/uL   Hemoglobin 9.7 (L) 12.0 - 15.0 g/dL   HCT 29.5 (L) 36.0 - 46.0 %   MCV 83.8 78.0 - 100.0 fL   MCH 27.6 26.0 - 34.0 pg   MCHC 32.9 30.0 - 36.0 g/dL   RDW 14.4 11.5 - 15.5 %   Platelets 202 150 - 400 K/uL    Comment: Performed at [facility], [place] [address], [place], [place] [address]  Urinalysis, Routine w reflex microscopic     Status: Abnormal   Collection Time: 09/06/18 11:34 AM  Result Value Ref Range   Color, Urine RED (A) YELLOW    Comment: BIOCHEMICALS MAY BE AFFECTED BY COLOR   APPearance CLOUDY (A) CLEAR   Specific Gravity, Urine 1.015 1.005 - 1.030   pH 6.5 5.0 - 8.0   Glucose, UA 100 (A) NEGATIVE mg/dL   Hgb urine dipstick LARGE (A) NEGATIVE   Bilirubin Urine SMEAR ONLY (A) NEGATIVE   Ketones, ur TRACE (A) NEGATIVE mg/dL   Protein, ur >300 (A) NEGATIVE mg/dL   Nitrite POSITIVE (A) NEGATIVE   Leukocytes, UA SMEAR ONLY (A) NEGATIVE    Comment: Performed at [facility], [place] [address] [place], [place] [address]  Urinalysis, Microscopic (reflex)     Status: Abnormal    Collection Time: 09/06/18 11:34 AM  Result Value Ref Range   RBC / HPF >50 0 - 5 RBC/hpf   WBC, UA 6-10 0 - 5 WBC/hpf   Bacteria, UA RARE (A) NONE SEEN   Squamous Epithelial / LPF 6-10 0 - 5    Comment: Performed at [facility], [place] [address] [place], [place] [address]  Na and K (sodium & potassium), rand urine     Status: None   Collection Time: 09/06/18 11:35 AM  Result Value Ref Range   Sodium, Ur 43 mmol/L   Potassium Urine 32 mmol/L    Comment: Performed at [facility] [address], [place], [place] [address]  Creatinine, urine, random     Status: None   Collection Time: 09/06/18 11:35 AM  Result Value Ref Range   Creatinine, Urine 67.54 mg/dL    Comment: Performed at [facility] [address] [place], [place] [address]  Protein / creatinine ratio, urine     Status: None   Collection Time: 09/06/18 11:35 AM  Result Value Ref Range   Creatinine, Urine 67.82 mg/dL   Total Protein, Urine 264 mg/dL    Comment: NO NORMAL RANGE ESTABLISHED FOR THIS TEST RESULTS CONFIRMED BY MANUAL DILUTION    Protein Creatinine Ratio        0.00 - 0.15 mg/mg[Cre]    Comment: RESULT BELOW REPORTABLE RANGE, UNABLE TO CALCULATE. Performed at [facility], [place] [address] [place] [address]   CBC     Status: Abnormal   Collection Time: 09/06/18  3:19 PM  Result Value Ref Range   WBC 14.7 (H) 4.0 - 10.5 K/uL   RBC 3.48 (L) 3.87 - 5.11 MIL/uL   Hemoglobin 9.6 (L) 12.0 - 15.0 g/dL   HCT 29.2 (L) 36.0 - 46.0 %   MCV 83.9 78.0 - 100.0 fL  MCH 27.6 26.0 - 34.0 pg   MCHC 32.9 30.0 - 36.0 g/dL   RDW 14.4 11.5 - 15.5 %   Platelets 184 150 - 400 K/uL    Comment: Performed at [facility], [place] [address] [place], [place] [address]  APTT     Status: None   Collection Time: 09/06/18  3:19 PM  Result Value Ref Range   aPTT 31 24 - 36 seconds    Comment: Performed at [facility] [address], [place], [place] [address]  Protime-INR     Status:  Abnormal   Collection Time: 09/06/18  3:19 PM  Result Value Ref Range   Prothrombin Time 15.6 (H) 11.4 - 15.2 seconds   INR 1.25     Comment: Performed at [facility] [address] [place], [place] [address]  Basic metabolic panel     Status: Abnormal   Collection Time: 09/06/18  4:48 PM  Result Value Ref Range   Sodium 126 (L) 135 - 145 mmol/L   Potassium 4.8 3.5 - 5.1 mmol/L   Chloride 95 (L) 98 - 111 mmol/L   CO2 18 (L) 22 - 32 mmol/L   Glucose, Bld 93 70 - 99 mg/dL   BUN 61 (H) 6 - 20 mg/dL   Creatinine, Ser 3.44 (H) 0.44 - 1.00 mg/dL   Calcium 7.9 (L) 8.9 - 10.3 mg/dL   GFR calc non Af Amer 14 (L) >60 mL/min   GFR calc Af Amer 16 (L) >60 mL/min    Comment: (NOTE) The eGFR has been calculated using the CKD EPI equation. This calculation has not been validated in all clinical situations. eGFR's persistently <60 mL/min signify possible Chronic Kidney Disease.    Anion gap 13 5 - 15    Comment: Performed at [facility] [address] [place], [place] [address]  CBC     Status: Abnormal   Collection Time: 09/06/18  8:37 PM  Result Value Ref Range   WBC 15.1 (H) 4.0 - 10.5 K/uL   RBC 3.42 (L) 3.87 - 5.11 MIL/uL   Hemoglobin 9.6 (L) 12.0 - 15.0 g/dL   HCT 28.8 (L) 36.0 - 46.0 %   MCV 84.2 78.0 - 100.0 fL   MCH 28.1 26.0 - 34.0 pg   MCHC 33.3 30.0 - 36.0 g/dL   RDW 14.6 11.5 - 15.5 %   Platelets 192 150 - 400 K/uL    Comment: Performed at [facility] [address] [place], [place] [address]  CBC     Status: Abnormal   Collection Time: 09/07/18  4:30 AM  Result Value Ref Range   WBC 13.7 (H) 4.0 - 10.5 K/uL   RBC 3.19 (L) 3.87 - 5.11 MIL/uL   Hemoglobin 8.7 (L) 12.0 - 15.0 g/dL   HCT 26.8 (L) 36.0 - 46.0 %   MCV 84.0 78.0 - 100.0 fL   MCH 27.3 26.0 - 34.0 pg   MCHC 32.5 30.0 - 36.0 g/dL   RDW 14.5 11.5 - 15.5 %   Platelets 188 150 - 400 K/uL    Comment: Performed at [facility], [place] [address], [place], [place] [address]  Basic  metabolic panel     Status: Abnormal   Collection Time: 09/07/18  4:30 AM  Result Value Ref Range   Sodium 127 (L) 135 - 145 mmol/L   Potassium 4.5 3.5 - 5.1 mmol/L   Chloride 98 98 - 111 mmol/L  CO2 17 (L) 22 - 32 mmol/L   Glucose, Bld 89 70 - 99 mg/dL   BUN 65 (H) 6 - 20 mg/dL   Creatinine, Ser 3.79 (H) 0.44 - 1.00 mg/dL   Calcium 8.1 (L) 8.9 - 10.3 mg/dL   GFR calc non Af Amer 12 (L) >60 mL/min   GFR calc Af Amer 14 (L) >60 mL/min    Comment: (NOTE) The eGFR has been calculated using the CKD EPI equation. This calculation has not been validated in all clinical situations. eGFR's persistently <60 mL/min signify possible Chronic Kidney Disease.    Anion gap 12 5 - 15    Comment: Performed at [facility] [address], [place], [place] [address]     ROS:  Pertinent items are noted in HPI.  Physical Exam: Vitals:   09/07/18 0455 09/07/18 1121  BP: (!) 173/95 (!) 169/100  Pulse: 94 98  Resp: 19 18  Temp: 97.8 F (36.6 C)   SpO2: 100% (!) 70%     General: mildly uncomfortable HEENT: MMM Eyes: anincteric Neck: JVD not elevated Heart: RRR, no rub Lungs: normal WOB, clear Abdomen: mildly distended, holding ice pack to abd Extremities: no edema Skin: no rashes Neuro: nonfocal  Assessment/Plan:  1.  AKI:  Presumably has normal renal function at baseline. PCP is Lysle Rubens in [place], will call for labs to verify. I suspect this is ATN from her acute blood loss anemia -- she did endorse orthostatic symptoms prior to admission and may have had hypotension. It's difficult to interpret the UP/C in the setting of frank hematuria, would recheck when urine more clear.  She had a clean UA in 11/2017.   2.  Hyponatremia:  She appears overall euvolemic.   Has not improved with IV hydration with serum sodium running in the 126-127 range and asymptomatic.  I suspect this is SIADH related to pain. Will check serum and urine osm to confirm.  Avoid use of hypotonic  fluids, currently on NS 75/hr which is OK.  If worsening will need additional measures to correct.    3.  Anemia: acute blood loss anemia.  No indication for ESA.  Care per primary team.  4.  HTN: no history of HTN.  Likely pain related.  Would not rx antiHTN therapy at this time.   Jannifer Hick A 09/07/2018, 1:08 PM

## 2018-09-08 ENCOUNTER — Inpatient Hospital Stay (HOSPITAL_COMMUNITY): Payer: BLUE CROSS/BLUE SHIELD

## 2018-09-08 ENCOUNTER — Encounter (HOSPITAL_COMMUNITY): Payer: Self-pay

## 2018-09-08 DIAGNOSIS — C649 Malignant neoplasm of unspecified kidney, except renal pelvis: Secondary | ICD-10-CM

## 2018-09-08 DIAGNOSIS — Z87448 Personal history of other diseases of urinary system: Secondary | ICD-10-CM

## 2018-09-08 DIAGNOSIS — R911 Solitary pulmonary nodule: Secondary | ICD-10-CM

## 2018-09-08 HISTORY — DX: Malignant neoplasm of unspecified kidney, except renal pelvis: C64.9

## 2018-09-08 LAB — CBC
HCT: 25.8 % — ABNORMAL LOW (ref 36.0–46.0)
HEMOGLOBIN: 8.3 g/dL — AB (ref 12.0–15.0)
MCH: 27.9 pg (ref 26.0–34.0)
MCHC: 32.2 g/dL (ref 30.0–36.0)
MCV: 86.6 fL (ref 78.0–100.0)
PLATELETS: 216 10*3/uL (ref 150–400)
RBC: 2.98 MIL/uL — AB (ref 3.87–5.11)
RDW: 14.7 % (ref 11.5–15.5)
WBC: 19.4 10*3/uL — AB (ref 4.0–10.5)

## 2018-09-08 LAB — RENAL FUNCTION PANEL
ANION GAP: 16 — AB (ref 5–15)
Albumin: 2.3 g/dL — ABNORMAL LOW (ref 3.5–5.0)
BUN: 69 mg/dL — ABNORMAL HIGH (ref 6–20)
CHLORIDE: 99 mmol/L (ref 98–111)
CO2: 15 mmol/L — AB (ref 22–32)
CREATININE: 3.99 mg/dL — AB (ref 0.44–1.00)
Calcium: 8.6 mg/dL — ABNORMAL LOW (ref 8.9–10.3)
GFR, EST AFRICAN AMERICAN: 13 mL/min — AB (ref >60)
GFR, EST NON AFRICAN AMERICAN: 11 mL/min — AB (ref >60)
Glucose, Bld: 99 mg/dL (ref 70–99)
Phosphorus: 5.9 mg/dL — ABNORMAL HIGH (ref 2.5–4.6)
Potassium: 4.5 mmol/L (ref 3.5–5.1)
Sodium: 130 mmol/L — ABNORMAL LOW (ref 135–145)

## 2018-09-08 NOTE — Progress Notes (Signed)
[facility] Progress Note   Subjective:   Feels overall about the same.  Had  A BM today and following had movement of abdominal pain to LUQ.  Hematuria continues to clear - no issues voiding.  Anorexia, continues on NS 75/hr.  No fevers, chills.   Objective Vitals:   09/07/18 1121 09/07/18 2102 09/08/18 0445 09/08/18 0759  BP: (!) 169/100 (!) 166/107 (!) 161/94 (!) 153/88  Pulse: 98 94 95 96  Resp: 18 18 20 18   Temp: 97.8 F (36.6 C) 98.5 F (36.9 C) 98.2 F (36.8 C) 98 F (36.7 C)  TempSrc: Oral Oral Oral Oral  SpO2: (!) 70% 99% 100% 100%  Weight:      Height:       Physical Exam General:  Lying quietly in bed at 45 deg Heart: RRR Lungs: normal WOB, clear Abdomen: soft, mod distended, +BS Extremities: trace edema   Additional Objective Labs: Basic Metabolic Panel: Recent Labs  Lab 09/06/18 1648 09/07/18 0430 09/08/18 0529  NA 126* 127* 130*  K 4.8 4.5 4.5  CL 95* 98 99  CO2 18* 17* 15*  GLUCOSE 93 89 99  BUN 61* 65* 69*  CREATININE 3.44* 3.79* 3.99*  CALCIUM 7.9* 8.1* 8.6*  PHOS  --   --  5.9*   Liver Function Tests: Recent Labs  Lab 09/05/18 1619 09/08/18 0529  AST 160*  --   ALT 10  --   ALKPHOS 72  --   BILITOT 1.0  --   PROT 5.8*  --   ALBUMIN 2.6* 2.3*   No results for input(s): LIPASE, AMYLASE in the last 168 hours. CBC: Recent Labs  Lab 09/06/18 1519 09/06/18 2037 09/07/18 0430 09/07/18 1425 09/08/18 0529  WBC 14.7* 15.1* 13.7* 14.4* 19.4*  HGB 9.6* 9.6* 8.7* 9.0* 8.3*  HCT 29.2* 28.8* 26.8* 27.6* 25.8*  MCV 83.9 84.2 84.0 84.4 86.6  PLT 184 192 188 226 216   Blood Culture    Component Value Date/Time   SDES URINE, RANDOM 09/05/2018 1322   SPECREQUEST NONE 09/05/2018 1322   CULT  09/05/2018 1322    NO GROWTH Performed at [facility], [place] [address], [place], [place] [address]    REPTSTATUS 09/06/2018 FINAL 09/05/2018 1322    Cardiac Enzymes: No results for input(s): CKTOTAL, CKMB, CKMBINDEX,  TROPONINI in the last 168 hours. CBG: No results for input(s): GLUCAP in the last 168 hours. Iron Studies: No results for input(s): IRON, TIBC, TRANSFERRIN, FERRITIN in the last 72 hours. @lablastinr3 @ Studies/Results: No results found. Medications: . sodium chloride 75 mL/hr at 09/06/18 2310    Assessment/Plan: 1.  AKI:  Spoke to PCPs office - 09/2017 Cr 0.8.  I suspect this is ATN from her acute blood loss anemia -- she did endorse orthostatic symptoms prior to admission and may have had hypotension. It's difficult to interpret the UP/C in the setting of frank hematuria, would recheck when urine more clear.  She had a clean UA in 11/2017.   Her creatinine continues to rise today without any clear insults since hospitalization.  I have ordered a repeat renal US to ensure no worsening hydronephrosis.    We discussed current level of renal function, role of dialysis and indications for such.  Hopefully need will not arise.   Continue supportive care avoiding renal insults by avoiding nephrotoxins, hypotension, hypovolemia.    2.  Hyponatremia:  Improved today from 127 to 130.  Her urine osm, serum osm 290 is inconsistent with  SIADH.  Could be related to poor solute intake - encouraged po intake and ok with continuing MIVF 75/hr for now.     3.  Anemia: acute blood loss anemia.  No indication for ESA.  Care per primary team.  4.  HTN: no history of HTN.  Likely pain related.  Would not rx antiHTN therapy at this time.   Jannifer Hick MD 09/08/2018, 11:48 AM  [facility] Pager: [phone]

## 2018-09-08 NOTE — Progress Notes (Signed)
   Subjective: Ms. Borgen reported feeling about the same today. She endorsed abdominal pain and distension that has slightly improved. She is tolerating PO intake but still feeling nauseated after eating. Denied vomiting. She said she noticed less blood in her urine. Denies flank pain. She denies any past medical history but reported a year she had a similar episode of hematuria with abdominal distension that resolved on its own.  Objective:  Vital signs in last 24 hours: Vitals:   09/07/18 1121 09/07/18 2102 09/08/18 0445 09/08/18 0759  BP: (!) 169/100 (!) 166/107 (!) 161/94 (!) 153/88  Pulse: 98 94 95 96  Resp: 18 18 20 18   Temp: 97.8 F (36.6 C) 98.5 F (36.9 C) 98.2 F (36.8 C) 98 F (36.7 C)  TempSrc: Oral Oral Oral Oral  SpO2: (!) 70% 99% 100% 100%  Weight:      Height:       Physical exam- General: alert awake and oriented x3, seen laying in bed in NAD Heart: RRR, no murmurs or gallops Lungs: CTA bilaterally, normal effort Abdominal: distended, bowel sounds present, generalized tenderness throughout lower abdomen  Assessment/Plan:  Principal Problem:   Retroperitoneal hematoma Active Problems:   Left renal mass   Pilar Plate hematuria   Acute renal failure (HCC)   Acute blood loss anemia  Ms. Boberg is a 57 yo female with no PMHx who presented with acute onset left sided flank pain, abdominal distension and hematuria for three days. She was found to have a renal mass and retroperitoneal hematoma. She reported a similar episode one year ago that was worked up for renal stone vs abdominal mass; that resolved on its own.  Acute blood loss anemia 2/2 retroperitoneal hematoma - patient is hemodynamically stable - Hgb down to 8.3 this morning from 9.0; repeat CBC tomorrow; transfuse as needed to keep hgb >7, she has received 2 units of blood thus far - urology recommended outpatient follow-up with repeat CT versus nephrectomy   Acute renal failure - BUN and Crt continue to  rise despite IVF; Cr 3.99, BUN 69  - possibly ATN in the setting of acute anemia. However, no casts on microscopy and UPC of 3.9 indicating nephrotic range proteinuria. Albumin low at 2.3, but no peripheral edema.  - Nephrology on board and appreciate their recommendations.    Dispo: Anticipated discharge in approximately 1-2 day(s).   Caro Brundidge, [place], DO 09/08/2018, 10:59 AM [phone]

## 2018-09-09 ENCOUNTER — Encounter (HOSPITAL_COMMUNITY): Payer: Self-pay | Admitting: General Practice

## 2018-09-09 DIAGNOSIS — I10 Essential (primary) hypertension: Secondary | ICD-10-CM

## 2018-09-09 LAB — BASIC METABOLIC PANEL
Anion gap: 7 (ref 5–15)
BUN: 70 mg/dL — ABNORMAL HIGH (ref 6–20)
CALCIUM: 7.4 mg/dL — AB (ref 8.9–10.3)
CO2: 16 mmol/L — ABNORMAL LOW (ref 22–32)
Chloride: 103 mmol/L (ref 98–111)
Creatinine, Ser: 4.07 mg/dL — ABNORMAL HIGH (ref 0.44–1.00)
GFR calc Af Amer: 13 mL/min — ABNORMAL LOW (ref >60)
GFR, EST NON AFRICAN AMERICAN: 11 mL/min — AB (ref >60)
GLUCOSE: 136 mg/dL — AB (ref 70–99)
POTASSIUM: 3.9 mmol/L (ref 3.5–5.1)
Sodium: 126 mmol/L — ABNORMAL LOW (ref 135–145)

## 2018-09-09 LAB — CBC
HCT: 21.8 % — ABNORMAL LOW (ref 36.0–46.0)
Hemoglobin: 7.1 g/dL — ABNORMAL LOW (ref 12.0–15.0)
MCH: 27.6 pg (ref 26.0–34.0)
MCHC: 32.6 g/dL (ref 30.0–36.0)
MCV: 84.8 fL (ref 78.0–100.0)
PLATELETS: 226 10*3/uL (ref 150–400)
RBC: 2.57 MIL/uL — ABNORMAL LOW (ref 3.87–5.11)
RDW: 14.3 % (ref 11.5–15.5)
WBC: 18.8 10*3/uL — ABNORMAL HIGH (ref 4.0–10.5)

## 2018-09-09 LAB — HEMOGLOBIN AND HEMATOCRIT, BLOOD
HCT: 28.9 % — ABNORMAL LOW (ref 36.0–46.0)
Hemoglobin: 9.4 g/dL — ABNORMAL LOW (ref 12.0–15.0)

## 2018-09-09 LAB — PREPARE RBC (CROSSMATCH)

## 2018-09-09 MED ORDER — SODIUM CHLORIDE 0.9 % IV SOLN
1.0000 g | INTRAVENOUS | Status: DC
  Administered 2018-09-09: 1 g via INTRAVENOUS
  Filled 2018-09-09 (×2): qty 10

## 2018-09-09 MED ORDER — INFLUENZA VAC SPLIT QUAD 0.5 ML IM SUSY
0.5000 mL | PREFILLED_SYRINGE | INTRAMUSCULAR | Status: AC | PRN
  Administered 2018-09-28: 0.5 mL via INTRAMUSCULAR
  Filled 2018-09-09 (×2): qty 0.5

## 2018-09-09 MED ORDER — SODIUM CHLORIDE 0.9% IV SOLUTION
Freq: Once | INTRAVENOUS | Status: AC
  Administered 2018-09-09: 13:00:00 via INTRAVENOUS

## 2018-09-09 MED ORDER — CALCIUM CARBONATE ANTACID 500 MG PO CHEW
1.0000 | CHEWABLE_TABLET | ORAL | Status: DC | PRN
  Administered 2018-09-09 – 2018-09-27 (×7): 200 mg via ORAL
  Filled 2018-09-09 (×8): qty 1

## 2018-09-09 NOTE — Progress Notes (Signed)
[facility] Progress Note   Subjective:   Feels overall about the same.  Abd pain has improved some.  Hematuria continues to clear - no issues voiding.  Anorexia, continues on NS 75/hr.  No fevers, chills.   She states she is in pain 100% of the time, sometimes better or worse but always present.    Objective Vitals:   09/08/18 1806 09/08/18 2032 09/09/18 0546 09/09/18 0820  BP: (!) 173/99 (!) 161/100 (!) 174/95 (!) 157/97  Pulse: (!) 102 100 98 (!) 102  Resp: 18 18 18 18   Temp:  98.8 F (37.1 C) 98.8 F (37.1 C) 99.5 F (37.5 C)  TempSrc:  Oral Oral Oral  SpO2: 99% 96% 95% 100%  Weight:  59 kg 63.3 kg   Height:       Physical Exam General:  Lying quietly in bed at 45 deg Heart: RRR Lungs: normal WOB, clear Abdomen: soft, mod distended, +BS Extremities: trace edema diffusely   Additional Objective Labs: Basic Metabolic Panel: Recent Labs  Lab 09/07/18 0430 09/08/18 0529 09/09/18 0409  NA 127* 130* 126*  K 4.5 4.5 3.9  CL 98 99 103  CO2 17* 15* 16*  GLUCOSE 89 99 136*  BUN 65* 69* 70*  CREATININE 3.79* 3.99* 4.07*  CALCIUM 8.1* 8.6* 7.4*  PHOS  --  5.9*  --    Liver Function Tests: Recent Labs  Lab 09/05/18 1619 09/08/18 0529  AST 160*  --   ALT 10  --   ALKPHOS 72  --   BILITOT 1.0  --   PROT 5.8*  --   ALBUMIN 2.6* 2.3*   No results for input(s): LIPASE, AMYLASE in the last 168 hours. CBC: Recent Labs  Lab 09/06/18 2037 09/07/18 0430 09/07/18 1425 09/08/18 0529 09/09/18 0409  WBC 15.1* 13.7* 14.4* 19.4* 18.8*  HGB 9.6* 8.7* 9.0* 8.3* 7.1*  HCT 28.8* 26.8* 27.6* 25.8* 21.8*  MCV 84.2 84.0 84.4 86.6 84.8  PLT 192 188 226 216 226   Blood Culture    Component Value Date/Time   SDES URINE, RANDOM 09/05/2018 1322   SPECREQUEST NONE 09/05/2018 1322   CULT  09/05/2018 1322    NO GROWTH Performed at [facility], [place] [address], [place], [place] [address]    REPTSTATUS 09/06/2018 FINAL 09/05/2018 1322    Cardiac  Enzymes: No results for input(s): CKTOTAL, CKMB, CKMBINDEX, TROPONINI in the last 168 hours. CBG: No results for input(s): GLUCAP in the last 168 hours. Iron Studies: No results for input(s): IRON, TIBC, TRANSFERRIN, FERRITIN in the last 72 hours. @lablastinr3 @ Studies/Results: US Renal  Result Date: 09/08/2018 CLINICAL DATA:  Acute kidney injury, hemorrhagic left renal mass EXAM: RENAL / URINARY TRACT ULTRASOUND COMPLETE COMPARISON:  Abdominal and pelvic CT scan of September 05, 2018 FINDINGS: Right Kidney: Length: 11.5 cm. The renal cortical echotexture is slightly greater than that of the adjacent liver. There is no hydronephrosis nor parenchymal mass. Left Kidney: Length: There is a large heterogeneous echotexture mass occupying the left renal fossa. It measures 16.8 x 12.3 x 15.9 cm. Normal renal architecture is not demonstrated. Bladder: The partially distended urinary bladder is normal. IMPRESSION: Large heterogeneous echotexture mass occupying the left renal fossa consistent with the CT findings. Increased renal cortical echotexture on the right consistent with medical renal disease. No hydronephrosis. Normal appearing urinary bladder. Electronically Signed   By: David  Martinique M.D.   On: 09/08/2018 12:20   Medications: . sodium chloride 75 mL/hr at 09/09/18  3329   . sodium chloride   Intravenous Once   Assessment/Plan: 1.  AKI:  Spoke to PCPs office - 09/2017 Cr 0.8.  I suspect this is ATN from her acute blood loss anemia -- she did endorse orthostatic symptoms prior to admission and may have had hypotension that was not captured. It's difficult to interpret the UP/C in the setting of frank hematuria, would recheck when urine more clear.  She had a clean UA in 11/2017.  I doubt she has a concurrent glomerular disease - the discolored urine occurred acutely on Wednesday when her pain began and she had no other preceding symptoms.   Her creatinine continues to rise today without any clear  insults since hospitalization.  Renal US yesterday to make sure no worsening hydronephrosis was unrevealing.  Primary team collecting 24 hour urine collection for CrCl and protein.     We discussed current level of renal function, role of dialysis and indications for such.  Hopefully need will not arise.   Continue supportive care avoiding renal insults by avoiding nephrotoxins, hypotension, hypovolemia.  NS 75/hr for now but may need to back off if she is anuric.     2.  Hyponatremia:  Down from 130 to 126 today but fairly stable c/w prior days. Her urine osm 246, serum osm 290 is inconsistent with SIADH.  Could be related to poor solute intake - encouraged po intake and ok with continuing MIVF 75/hr for now but she is becoming progressively volume expanded so will need to reasses daily.    3.  Anemia: acute blood loss anemia.  No indication for ESA.  Care per primary team.  4.  HTN: no history of HTN.  May be pain related.  She has also been volume expanded with NS which could be contributing.   Imaging not showing subcapsular hematoma so Paige kidney physiology not suspected.  If persists could consider antiHTN therapy.   Jannifer Hick MD 09/09/2018, 11:52 AM  [facility] Pager: [phone]

## 2018-09-09 NOTE — Progress Notes (Signed)
Urology Inpatient Progress Report  Dehydration [E86.0] Left renal mass [N28.89] Pilar Plate hematuria [R31.0] Sepsis, due to unspecified organism [A41.9] Acute renal failure, unspecified acute renal failure type ([place]) [N17.9]        Intv/Subj: Hgb down to 7.1, receiving 1U PRBC. Continues with some LLQ pain, responsive to pain meds. Cr continues to worsen and UOP is less. Renal US showed no hydronephrosis on the right. Hematuria continues to improve.   Principal Problem:   Retroperitoneal hematoma Active Problems:   Left renal mass   Pilar Plate hematuria   Acute renal failure (HCC)   Acute blood loss anemia  Current Facility-Administered Medications  Medication Dose Route Frequency Provider Last Rate Last Dose  . calcium carbonate (TUMS - dosed in mg elemental calcium) chewable tablet 200 mg of elemental calcium  1 tablet Oral Q4H PRN Rehman, Areeg N, DO   200 mg of elemental calcium at 09/09/18 1136  . cefTRIAXone (ROCEPHIN) 1 g in sodium chloride 0.9 % 100 mL IVPB  1 g Intravenous Q24H Rehman, Areeg N, DO      . HYDROmorphone (DILAUDID) injection 1 mg  1 mg Intravenous Q2H PRN Kathi Ludwig, MD   1 mg at 09/09/18 1709  . ondansetron (ZOFRAN) injection 4 mg  4 mg Intravenous Q6H PRN Bloomfield, Carley D, DO   4 mg at 09/06/18 1444  . senna-docusate (Senokot-S) tablet 1 tablet  1 tablet Oral QHS PRN Kalman Shan Ratliff, DO      . simethicone (MYLICON) chewable tablet 80 mg  80 mg Oral Q6H PRN Kalman Shan Ratliff, DO   80 mg at 09/09/18 1040     Objective: Vital: Vitals:   09/09/18 0820 09/09/18 1310 09/09/18 1346 09/09/18 1706  BP: (!) 157/97 (!) 175/97 (!) 184/106 (!) 183/107  Pulse: (!) 102 (!) 101 (!) 101 (!) 105  Resp: 18 16 16 18   Temp: 99.5 F (37.5 C) 98.4 F (36.9 C) 97.9 F (36.6 C) 98.9 F (37.2 C)  TempSrc: Oral Oral Oral Oral  SpO2: 100% 99% 99% 99%  Weight:      Height:       I/Os: I/O last 3 completed shifts: In: 3242.1 [I.V.:3242.1] Out: 0    Physical Exam:  General: Patient is in no apparent distress Lungs: Normal respiratory effort, chest expands symmetrically. GI:The abdomen is soft and nontender without mass. GU:The abdomen is soft but mildly distended with palpable mass in the left abdomen Ext: lower extremities symmetric  Lab Results: Recent Labs    09/07/18 1425 09/08/18 0529 09/09/18 0409  WBC 14.4* 19.4* 18.8*  HGB 9.0* 8.3* 7.1*  HCT 27.6* 25.8* 21.8*   Recent Labs    09/07/18 0430 09/08/18 0529 09/09/18 0409  NA 127* 130* 126*  K 4.5 4.5 3.9  CL 98 99 103  CO2 17* 15* 16*  GLUCOSE 89 99 136*  BUN 65* 69* 70*  CREATININE 3.79* 3.99* 4.07*  CALCIUM 8.1* 8.6* 7.4*   No results for input(s): LABPT, INR in the last 72 hours. No results for input(s): LABURIN in the last 72 hours. Results for orders placed or performed during the hospital encounter of 09/05/18  Blood Culture (routine x 2)     Status: None (Preliminary result)   Collection Time: 09/05/18  9:44 AM  Result Value Ref Range Status   Specimen Description BLOOD WEAKLY REACTIVE LEFT  Final   Special Requests   Final    BOTTLES DRAWN AEROBIC ONLY Blood Culture results may not be optimal due to an  inadequate volume of blood received in culture bottles   Culture   Final    NO GROWTH 4 DAYS Performed at [facility], [place] [address], [place], [place] [address]    Report Status PENDING  Incomplete  Blood Culture (routine x 2)     Status: None (Preliminary result)   Collection Time: 09/05/18  9:49 AM  Result Value Ref Range Status   Specimen Description BLOOD LEFT HAND  Final   Special Requests   Final    BOTTLES DRAWN AEROBIC ONLY Blood Culture results may not be optimal due to an inadequate volume of blood received in culture bottles   Culture   Final    NO GROWTH 4 DAYS Performed at [facility], [place] [address] [place], [place] [address]    Report Status PENDING  Incomplete  Urine C&S     Status: None   Collection Time:  09/05/18  1:22 PM  Result Value Ref Range Status   Specimen Description URINE, RANDOM  Final   Special Requests NONE  Final   Culture   Final    NO GROWTH Performed at [facility], [place] [address], [place], [place] [address]    Report Status 09/06/2018 FINAL  Final    Studies/Results: US Renal  Result Date: 09/08/2018 CLINICAL DATA:  Acute kidney injury, hemorrhagic left renal mass EXAM: RENAL / URINARY TRACT ULTRASOUND COMPLETE COMPARISON:  Abdominal and pelvic CT scan of September 05, 2018 FINDINGS: Right Kidney: Length: 11.5 cm. The renal cortical echotexture is slightly greater than that of the adjacent liver. There is no hydronephrosis nor parenchymal mass. Left Kidney: Length: There is a large heterogeneous echotexture mass occupying the left renal fossa. It measures 16.8 x 12.3 x 15.9 cm. Normal renal architecture is not demonstrated. Bladder: The partially distended urinary bladder is normal. IMPRESSION: Large heterogeneous echotexture mass occupying the left renal fossa consistent with the CT findings. Increased renal cortical echotexture on the right consistent with medical renal disease. No hydronephrosis. Normal appearing urinary bladder. Electronically Signed   By: David  Martinique M.D.   On: 09/08/2018 12:20    Assessment: Left retroperitoneal hematoma secondary to renal mass Acute renal insufficiency  Plan: Agree with conservative management with blood transfusion for Hgb <7 Ideally will regain renal function prior to considering any urological intervention. Will need outpatient f/u. If rapid decline, recommend IR embolization though she appears to be stable at this point. Continue to follow Hgb   Link Snuffer, MD Urology 09/09/2018, 5:18 PM

## 2018-09-09 NOTE — Progress Notes (Signed)
Medicine attending: We appreciate nephrology input. I examined this patient today together with resident physician Dr Harlow Ohms and I concur with her evaluation and management plan which we discussed together. Hemoglobin has drifted down since initial transfusion.  We will give 1 additional unit today. Creatinine appears to be plateauing.  Potassium remains in normal range.  Bicarbonate 16 but no anion gap.  However, she is becoming oliguric.   Reactive leukocytosis secondary to resolving hematoma.  Abdomen remains tense.  Tender primarily left lower quadrant. We reviewed recent CT imaging in detail with the radiologist.  In the absence of the ability to use IV contrast, there is no other test that will give [place] a clear map of the renal vasculature that will help [place] to make management decisions. Urology does not plan any intervention at this time. Follow-up ultrasound did not show hydronephrosis. She is really not in that much pain so I do not think that her hypertension is explained by this. Continued in-hospital observation until she clearly stops bleeding. Possibility of temporary dialysis discussed with her by nephrology.

## 2018-09-09 NOTE — Care Management Note (Signed)
Case Management Note  Patient Details  Name: Sharon Henson MRN: [id number] Date of Birth: 1961-06-28  Subjective/Objective:           Suspect this is ATN from her acute blood loss anemia. Her creatinine continues to rise today without any clear insults since hospitalization.           Action/Plan:  Independent patient from home alone. CM will continue to follow.   Coverage [employer] PCP B. Reese  Expected Discharge Date:                  Expected Discharge Plan:  Home/Self Care  In-House Referral:     Discharge planning Services  CM Consult  Post Acute Care Choice:    Choice offered to:     DME Arranged:    DME Agency:     HH Arranged:    HH Agency:     Status of Service:  In process, will continue to follow  If discussed at Long Length of Stay Meetings, dates discussed:    Additional Comments:  Carles Collet, RN 09/09/2018, 11:20 AM

## 2018-09-09 NOTE — Progress Notes (Signed)
   Subjective: Ms. Emerick reported having abdominal pain when seen and evaluated this morning. She said she noticed her pain was predominately in the LLQ yesterday; however, after having a bowel movement her pain radiated to the LUQ. Today she the pain was mostly on the left side, both in the LUQ and LLQ. She is tolerating PO intake with some nausea but not vomiting. She said she is noticing less blood in her urine. She still feels very distended in her abdomen. She denied any blood in her stool that she noticed.   Objective:  Vital signs in last 24 hours: Vitals:   09/08/18 1806 09/08/18 2032 09/09/18 0546 09/09/18 0820  BP: (!) 173/99 (!) 161/100 (!) 174/95 (!) 157/97  Pulse: (!) 102 100 98 (!) 102  Resp: 18 18 18 18   Temp:  98.8 F (37.1 C) 98.8 F (37.1 C) 99.5 F (37.5 C)  TempSrc:  Oral Oral Oral  SpO2: 99% 96% 95% 100%  Weight:  59 kg 63.3 kg   Height:       Physical exam- General: seen laying in bed, in NAD but having abdominal discomfort Heart: RRR, no murmurs Lungs: CTA bilaterally, normal effort Abdomen: distended, bowel sounds present, no fluid wave Extremities: no edema   Assessment/Plan:  Principal Problem:   Retroperitoneal hematoma Active Problems:   Left renal mass   Pilar Plate hematuria   Acute renal failure (HCC)   Acute blood loss anemia  Ms. Erpelding is a 57 yo female with no PMHx who presented with acute onset left sided flank pain, abdominal distension and hematuria for three days. She was found to have a hemorrhagic mass arising from the left kidney, worrisome for RCC. She reported a similar episode one year ago that was worked up for renal stone vs abdominal mass; that resolved on its own.  Acute blood loss anemia 2/2 retroperitoneal hematoma - patient is hemodynamically stable -Hgb down to 7.1 this morning from 8.3; transfused 1 unit of PRBC; post-transfusion H & H pending - repeat CBC tomorrow; transfuse as needed to keep hgb >7, she has received 3  units of blood thus far - urology recommended outpatient follow-up with repeat CT versus nephrectomy  Acute renal failure -BUN and Crt about the same as yesterday; Cr 4.07, BUN 70 - considered other imaging with duplex renal US vs MRA vs CTA vs angiogram; discussed with radiology and seems further imaging at this time will not change the course of therapy or treatment; if patient becomes hemodynamically unstable will reconsider these options - possibly ATN in the setting of acute anemia. However, no casts on microscopy and UPC of 3.9 indicating nephrotic range proteinuria. Albumin low at 2.3, but no peripheral edema - discontinued 75 ml/hr NS due to HTN and good urine output - Nephrology on board and appreciate their recommendations  HTN -174/95; possibly due to hemorrhagic compression of renal vasculature  -continue to monitor   Dispo: Anticipated discharge in approximately 2-3 day(s).   Mike Craze, DO 09/09/2018, 11:58 AM Pager: [phone]

## 2018-09-10 ENCOUNTER — Inpatient Hospital Stay (HOSPITAL_COMMUNITY): Payer: BLUE CROSS/BLUE SHIELD

## 2018-09-10 ENCOUNTER — Encounter (HOSPITAL_COMMUNITY): Payer: Self-pay | Admitting: Interventional Radiology

## 2018-09-10 HISTORY — PX: IR US GUIDE VASC ACCESS RIGHT: IMG2390

## 2018-09-10 HISTORY — PX: IR FLUORO GUIDE CV LINE RIGHT: IMG2283

## 2018-09-10 LAB — CULTURE, BLOOD (ROUTINE X 2)
CULTURE: NO GROWTH
Culture: NO GROWTH
Specimen Description: REACTIVE

## 2018-09-10 LAB — CBC
HEMATOCRIT: 30.4 % — AB (ref 36.0–46.0)
Hemoglobin: 9.8 g/dL — ABNORMAL LOW (ref 12.0–15.0)
MCH: 28.4 pg (ref 26.0–34.0)
MCHC: 32.2 g/dL (ref 30.0–36.0)
MCV: 88.1 fL (ref 78.0–100.0)
Platelets: 293 10*3/uL (ref 150–400)
RBC: 3.45 MIL/uL — AB (ref 3.87–5.11)
RDW: 14.5 % (ref 11.5–15.5)
WBC: 24.3 10*3/uL — AB (ref 4.0–10.5)

## 2018-09-10 LAB — PROTEIN, URINE, 24 HOUR
COLLECTION INTERVAL-UPROT: 24 h
PROTEIN, 24H URINE: 910 mg/d — AB (ref 50–100)
PROTEIN, URINE: 130 mg/dL
Urine Total Volume-UPROT: 700 mL

## 2018-09-10 LAB — TYPE AND SCREEN
ABO/RH(D): A POS
Antibody Screen: NEGATIVE
Unit division: 0

## 2018-09-10 LAB — BASIC METABOLIC PANEL
ANION GAP: 13 (ref 5–15)
BUN: 78 mg/dL — ABNORMAL HIGH (ref 6–20)
CALCIUM: 8.3 mg/dL — AB (ref 8.9–10.3)
CO2: 15 mmol/L — AB (ref 22–32)
Chloride: 102 mmol/L (ref 98–111)
Creatinine, Ser: 4.59 mg/dL — ABNORMAL HIGH (ref 0.44–1.00)
GFR calc non Af Amer: 10 mL/min — ABNORMAL LOW (ref >60)
GFR, EST AFRICAN AMERICAN: 11 mL/min — AB (ref >60)
Glucose, Bld: 106 mg/dL — ABNORMAL HIGH (ref 70–99)
Potassium: 4.1 mmol/L (ref 3.5–5.1)
Sodium: 130 mmol/L — ABNORMAL LOW (ref 135–145)

## 2018-09-10 LAB — BPAM RBC
Blood Product Expiration Date: 201910072359
ISSUE DATE / TIME: 201910021327
Unit Type and Rh: 6200

## 2018-09-10 LAB — CREATININE, URINE, 24 HOUR
COLLECTION INTERVAL-UCRE24: 24 h
Creatinine, 24H Ur: 462 mg/d — ABNORMAL LOW (ref 600–1800)
Creatinine, Urine: 66.03 mg/dL
URINE TOTAL VOLUME-UCRE24: 700 mL

## 2018-09-10 MED ORDER — FUROSEMIDE 10 MG/ML IJ SOLN
160.0000 mg | Freq: Once | INTRAVENOUS | Status: AC
  Administered 2018-09-10: 160 mg via INTRAVENOUS
  Filled 2018-09-10: qty 16

## 2018-09-10 MED ORDER — AMLODIPINE BESYLATE 5 MG PO TABS
5.0000 mg | ORAL_TABLET | Freq: Every day | ORAL | Status: DC
  Administered 2018-09-10 – 2018-09-11 (×2): 5 mg via ORAL
  Filled 2018-09-10 (×2): qty 1

## 2018-09-10 MED ORDER — ENSURE ENLIVE PO LIQD
237.0000 mL | Freq: Two times a day (BID) | ORAL | Status: DC
  Administered 2018-09-10 – 2018-09-27 (×25): 237 mL via ORAL

## 2018-09-10 MED ORDER — HEPARIN SODIUM (PORCINE) 1000 UNIT/ML IJ SOLN
INTRAMUSCULAR | Status: AC
  Filled 2018-09-10: qty 1

## 2018-09-10 MED ORDER — HEPARIN SODIUM (PORCINE) 1000 UNIT/ML IJ SOLN
INTRAMUSCULAR | Status: AC | PRN
  Administered 2018-09-10: 2800 [IU] via INTRAVENOUS

## 2018-09-10 MED ORDER — LIDOCAINE HCL 1 % IJ SOLN
INTRAMUSCULAR | Status: AC
  Filled 2018-09-10: qty 20

## 2018-09-10 MED ORDER — LIDOCAINE HCL 1 % IJ SOLN
INTRAMUSCULAR | Status: AC | PRN
  Administered 2018-09-10: 5 mL

## 2018-09-10 NOTE — Progress Notes (Addendum)
   Subjective: Ms. Mccrae reported feeling about the same today. She said the abdominal pain is predominately in the LUQ now and she feels very swollen. She said her pain is controlled on the current pain medications and with a heating pad. She felt more comfortable laying flat and said it was a little hard to take deep breaths. She said she is not urinating much and is continuing to notice blood in her urine with clots today. She is tolerating PO intake and wanted to try advancing her diet. She is having more acid reflux today. Her husband was at the bedside today and all questions and concerns were addressed.   Objective:  Vital signs in last 24 hours: Vitals:   09/09/18 2035 09/10/18 0447 09/10/18 0500 09/10/18 0800  BP: (!) 169/99 (!) 176/104  (!) 183/101  Pulse: (!) 107 100  (!) 101  Resp: 18 18  18   Temp: 98.4 F (36.9 C) 98.2 F (36.8 C)  97.8 F (36.6 C)  TempSrc: Oral Oral  Oral  SpO2: 96% 100%  98%  Weight:   63.4 kg   Height:       Physical exam- General: seen laying flat in bed in NAD, uncomfortable Heart: RRR, no murmurs Lungs: CTA bilaterally Abdomen: distended, bowel sounds present, no fluid wave, LUQ tenderness  Extremities: no edema  Assessment/Plan:  Principal Problem:   Retroperitoneal hematoma Active Problems:   Left renal mass   Pilar Plate hematuria   Acute renal failure (HCC)   Acute blood loss anemia  Ms. Gutt is a 57 yo female with no PMHx who presented with acute onset left sided flank pain, abdominal distension and hematuria for three days. She was found to have a hemorrhagic mass arising from the left kidney, worrisome for RCC. She reported a similar episode one year ago that was worked up for renal stone vs abdominal mass; that resolved on its own.  Acute blood loss anemia 2/2 retroperitoneal hematoma - patient is hemodynamically stable -Hgb 9.8 this morning; transfused 1 unit of PRBC yesterday - repeat CBCwith diff tomorrow; transfuse as needed  to keep hgb >7, shehas received 3 units of blood thus far - urology recommended outpatient follow-up with repeat CT versus nephrectomy; recommended IR embolization if patient has a rapid decline; appreciate recommendations   Acute renal failure -BUN and Crt increased today; Cr 4.59, BUN 78 - if patient becomes hemodynamically unstable will reconsider duplex renal US vs MRA vs CTA  - possibly ATN in the setting of acute anemia; total 24 hour urine protein increased at 910, total 24 hour urine creatine decreased at 462; urine output ~400 cc in the past 24 hours, patient becoming progressively oliguric, recommend aggressive diuresing - Nephrology on board and appreciate their recommendations  HTN -183/101; possibly due to hemorrhagic compression of renal vasculature and volume status - recommend aggressive diuresis which may help decrease worsening blood pressure -continue to monitor   Dispo: Anticipated discharge in approximately 2-3 day(s).   Alter Moss N, DO 09/10/2018, 10:16 AM Pager: [phone]

## 2018-09-10 NOTE — Progress Notes (Signed)
[facility] Progress Note   Subjective:     Has started passing clots again - 1.5in by her estimation.   UOP down.   She feels overall ok.   Objective Vitals:   09/09/18 2035 09/10/18 0447 09/10/18 0500 09/10/18 0800  BP: (!) 169/99 (!) 176/104  (!) 183/101  Pulse: (!) 107 100  (!) 101  Resp: 18 18  18   Temp: 98.4 F (36.9 C) 98.2 F (36.8 C)  97.8 F (36.6 C)  TempSrc: Oral Oral  Oral  SpO2: 96% 100%  98%  Weight:   63.4 kg   Height:       Physical Exam General:  Lying quietly in bed at 45 deg Heart: RRR, no rub Lungs: normal WOB, clear Abdomen: soft, mod distended, +BS Extremities: trace edema diffusely   Additional Objective Labs: Basic Metabolic Panel: Recent Labs  Lab 09/08/18 0529 09/09/18 0409 09/10/18 0652  NA 130* 126* 130*  K 4.5 3.9 4.1  CL 99 103 102  CO2 15* 16* 15*  GLUCOSE 99 136* 106*  BUN 69* 70* 78*  CREATININE 3.99* 4.07* 4.59*  CALCIUM 8.6* 7.4* 8.3*  PHOS 5.9*  --   --    Liver Function Tests: Recent Labs  Lab 09/05/18 1619 09/08/18 0529  AST 160*  --   ALT 10  --   ALKPHOS 72  --   BILITOT 1.0  --   PROT 5.8*  --   ALBUMIN 2.6* 2.3*   No results for input(s): LIPASE, AMYLASE in the last 168 hours. CBC: Recent Labs  Lab 09/07/18 0430 09/07/18 1425 09/08/18 0529 09/09/18 0409 09/09/18 2058 09/10/18 0652  WBC 13.7* 14.4* 19.4* 18.8*  --  24.3*  HGB 8.7* 9.0* 8.3* 7.1* 9.4* 9.8*  HCT 26.8* 27.6* 25.8* 21.8* 28.9* 30.4*  MCV 84.0 84.4 86.6 84.8  --  88.1  PLT 188 226 216 226  --  293   Blood Culture    Component Value Date/Time   SDES URINE, RANDOM 09/05/2018 1322   SPECREQUEST NONE 09/05/2018 1322   CULT  09/05/2018 1322    NO GROWTH Performed at [facility], [place] [address], [place], [place] [address]    REPTSTATUS 09/06/2018 FINAL 09/05/2018 1322    Cardiac Enzymes: No results for input(s): CKTOTAL, CKMB, CKMBINDEX, TROPONINI in the last 168 hours. CBG: No results for input(s):  GLUCAP in the last 168 hours. Iron Studies: No results for input(s): IRON, TIBC, TRANSFERRIN, FERRITIN in the last 72 hours. @lablastinr3 @ Studies/Results: US Renal  Result Date: 09/08/2018 CLINICAL DATA:  Acute kidney injury, hemorrhagic left renal mass EXAM: RENAL / URINARY TRACT ULTRASOUND COMPLETE COMPARISON:  Abdominal and pelvic CT scan of September 05, 2018 FINDINGS: Right Kidney: Length: 11.5 cm. The renal cortical echotexture is slightly greater than that of the adjacent liver. There is no hydronephrosis nor parenchymal mass. Left Kidney: Length: There is a large heterogeneous echotexture mass occupying the left renal fossa. It measures 16.8 x 12.3 x 15.9 cm. Normal renal architecture is not demonstrated. Bladder: The partially distended urinary bladder is normal. IMPRESSION: Large heterogeneous echotexture mass occupying the left renal fossa consistent with the CT findings. Increased renal cortical echotexture on the right consistent with medical renal disease. No hydronephrosis. Normal appearing urinary bladder. Electronically Signed   By: David  Martinique M.D.   On: 09/08/2018 12:20   Medications: . furosemide     . amLODipine  5 mg Oral Daily  . feeding supplement (ENSURE ENLIVE)  237 mL  Oral BID BM   Assessment/Plan: 1.  AKI:  Normal renal function 09/2017. Suspected ATN from her acute blood loss anemia -- she did endorse orthostatic symptoms prior to admission and may have had hypotension that was not captured.  In light of worsening renal function, oliguria have discussed with her placement of temporary HD catheter and HD.  Ordered for tomorrow.   Today her IVF have been stopped and will to do a trial of high dose lasix 160 IV x 1.    2.  Hemorrhagic renal mass: urology following; suspected malignancy.  She states she is passing clots again.  CTM.  3.  Hyponatremia:  Stable at 130 today. Her urine osm 246, serum osm 290 is inconsistent with SIADH.  Could be related to poor solute  intake - encouraged po intake.  In light of worsening volume status and near anuria have D/C'd MIVF.     4.  Anemia: acute blood loss anemia.  No indication for ESA and would not give in light of suspected malignancy.  Care per primary team.   5.  HTN: no history of HTN.  May be pain related but seems out of proportion.  Question if has some compression of renal artery by hematoma.  Imaging has been considered - renal artery doppler.  I think for now it's reasonable to start antiHTN therapy with amlodipine 5mg  daily.  She likely also a contributing factor of volume expanded state from NS infusion and this has been discontinued.  Trial of diuretic underway.   Jannifer Hick MD 09/10/2018, 10:48 AM  [facility] Pager: [phone]

## 2018-09-10 NOTE — Progress Notes (Signed)
   Patient Status: [facility] - In-pt  Assessment and Plan: Patient in need of venous access for initiation of dialysis. Temp cath has been requested.  Patient seen and consented.   Risks and benefits discussed with the patient including, but not limited to bleeding, infection, vascular injury, pneumothorax which may require chest tube placement, air embolism or even death  All of the patient's questions were answered, patient is agreeable to proceed. Consent signed and in chart.  ______________________________________________________________________   History of Present Illness: Sharon Henson is a 57 y.o. female with past medical history of acute blood loss anemia due to spontaneous retroperitoneal bleed and hematoma.  Now with acute renal injury/failure.  Patient in need of access for dialysis initiation which is planned for tomorrow, 10/4  Allergies and medications reviewed.   Review of Systems: A 12 point ROS discussed and pertinent positives are indicated in the HPI above.  All other systems are negative.  Review of Systems  Constitutional: Negative for fatigue and fever.  Respiratory: Negative for cough and shortness of breath.   Cardiovascular: Negative for chest pain.  Gastrointestinal: Negative for abdominal pain.  Genitourinary: Positive for flank pain.  Musculoskeletal: Positive for back pain.  Psychiatric/Behavioral: Negative for behavioral problems and confusion.    Vital Signs: BP (!) 183/101 (BP Location: Left Arm)   Pulse (!) 101   Temp 97.8 F (36.6 C) (Oral)   Resp 18   Ht 5' (1.524 m)   Wt 139 lb 12.4 oz (63.4 kg)   SpO2 98%   BMI 27.30 kg/m   Physical Exam  Constitutional: She appears well-developed. No distress.  Neck: Normal range of motion. Neck supple.  Cardiovascular: Normal rate, regular rhythm and normal heart sounds. Exam reveals no gallop and no friction rub.  No murmur heard. Pulmonary/Chest: Effort normal and breath sounds normal. No  respiratory distress.  Abdominal: Soft. Bowel sounds are normal. She exhibits no distension.  Skin: Skin is warm and dry. She is not diaphoretic.  Psychiatric: She has a normal mood and affect. Her behavior is normal. Judgment and thought content normal.  Nursing note and vitals reviewed.    Imaging reviewed.   Labs:  COAGS: Recent Labs    09/06/18 1519  INR 1.25  APTT 31    BMP: Recent Labs    09/07/18 0430 09/08/18 0529 09/09/18 0409 09/10/18 0652  NA 127* 130* 126* 130*  K 4.5 4.5 3.9 4.1  CL 98 99 103 102  CO2 17* 15* 16* 15*  GLUCOSE 89 99 136* 106*  BUN 65* 69* 70* 78*  CALCIUM 8.1* 8.6* 7.4* 8.3*  CREATININE 3.79* 3.99* 4.07* 4.59*  GFRNONAA 12* 11* 11* 10*  GFRAA 14* 13* 13* 11*     Electronically Signed: Docia Barrier, PA 09/10/2018, 12:54 PM   I spent a total of 15 minutes in face to face in clinical consultation, greater than 50% of which was counseling/coordinating care for venous access.

## 2018-09-10 NOTE — Procedures (Signed)
RIJV temp HD catheter SVC RA EBL 0 Cop 0

## 2018-09-11 LAB — CBC WITH DIFFERENTIAL/PLATELET
BASOS ABS: 0 10*3/uL (ref 0.0–0.1)
Basophils Relative: 0 %
EOS PCT: 3 %
Eosinophils Absolute: 0.7 10*3/uL (ref 0.0–0.7)
HCT: 25.5 % — ABNORMAL LOW (ref 36.0–46.0)
HEMOGLOBIN: 8.3 g/dL — AB (ref 12.0–15.0)
LYMPHS PCT: 8 %
Lymphs Abs: 1.9 10*3/uL (ref 0.7–4.0)
MCH: 27.9 pg (ref 26.0–34.0)
MCHC: 32.5 g/dL (ref 30.0–36.0)
MCV: 85.9 fL (ref 78.0–100.0)
MONOS PCT: 8 %
Monocytes Absolute: 1.9 10*3/uL — ABNORMAL HIGH (ref 0.1–1.0)
Neutro Abs: 19.6 10*3/uL — ABNORMAL HIGH (ref 1.7–7.7)
Neutrophils Relative %: 81 %
Platelets: 357 10*3/uL (ref 150–400)
RBC: 2.97 MIL/uL — AB (ref 3.87–5.11)
RDW: 14.6 % (ref 11.5–15.5)
WBC: 24.1 10*3/uL — ABNORMAL HIGH (ref 4.0–10.5)

## 2018-09-11 LAB — BASIC METABOLIC PANEL
ANION GAP: 19 — AB (ref 5–15)
BUN: 76 mg/dL — ABNORMAL HIGH (ref 6–20)
CHLORIDE: 97 mmol/L — AB (ref 98–111)
CO2: 19 mmol/L — ABNORMAL LOW (ref 22–32)
Calcium: 8.9 mg/dL (ref 8.9–10.3)
Creatinine, Ser: 3.8 mg/dL — ABNORMAL HIGH (ref 0.44–1.00)
GFR calc Af Amer: 14 mL/min — ABNORMAL LOW (ref >60)
GFR, EST NON AFRICAN AMERICAN: 12 mL/min — AB (ref >60)
Glucose, Bld: 144 mg/dL — ABNORMAL HIGH (ref 70–99)
POTASSIUM: 3.5 mmol/L (ref 3.5–5.1)
Sodium: 135 mmol/L (ref 135–145)

## 2018-09-11 MED ORDER — AMLODIPINE BESYLATE 5 MG PO TABS: 2.5000 mg | ORAL_TABLET | Freq: Every day | ORAL | Status: DC

## 2018-09-11 MED ORDER — FUROSEMIDE 10 MG/ML IJ SOLN
40.0000 mg | Freq: Once | INTRAMUSCULAR | Status: AC
  Administered 2018-09-11: 40 mg via INTRAVENOUS
  Filled 2018-09-11: qty 4

## 2018-09-11 NOTE — Progress Notes (Signed)
   09/11/18 1200  Clinical Encounter Type  Visited With Patient and family together  Visit Type Spiritual support  Referral From Nurse  Spiritual Encounters  Spiritual Needs Prayer  Stress Factors  Patient Stress Factors Major life changes  Responded to [facility] consult for major life transitions. Patient was resting well and her husband was at her bedside. Patient states that she is doing ok just waiting to see what is next for her care plan to return home. Husband said they been married 81 years, and wife smiled. Patient requested to be added to prayer list. Told her that [facility] will lift her up in prayer. Provided spiritual and emotional care.

## 2018-09-11 NOTE — Progress Notes (Signed)
   Subjective: [name] reported feeling about the same today. She said her stomach feels a little less distended and she urinated a lot over the past day. Pain is the same. She denied any n/v/d. She is tolerating PO intake. She is still noticing blood in her urine but was unsure if she was still having clots in her urine. She said her catheter site feels okay.   Objective:  Vital signs in last 24 hours: Vitals:   09/10/18 1622 09/10/18 2258 09/11/18 0438 09/11/18 0820  BP: (!) 168/96 138/86 (!) 135/92 (!) 151/92  Pulse: (!) 103 (!) 102 (!) 107 95  Resp: 18 18 18 18   Temp: 97.8 F (36.6 C) 98.8 F (37.1 C) 98.2 F (36.8 C) 98 F (36.7 C)  TempSrc: Oral Oral Oral Oral  SpO2: 99% 95% 97% 98%  Weight:  63.4 kg    Height:       Physical exam- General- seen laying in bed in NAD Heart- RRR, no murmurs Lungs- CTA bilaterally Abdomen- distended, mildly improved from yesterday, LUQ tenderness, no fluid wave, bowel sounds present Extremities- no edema  Assessment/Plan:  Principal Problem:   Retroperitoneal hematoma Active Problems:   Left renal mass   Pilar Plate hematuria   Acute renal failure (HCC)   Acute blood loss anemia  [name] is a 57 yo female with no PMHx who presented with acute onset left sided flank pain, abdominal distension and hematuria for three days. She was found to havea hemorrhagic mass arising from the left kidney, worrisome for RCC.  Acute blood loss anemia 2/2 retroperitoneal hematoma - patient is hemodynamically stable -Hgb 8.3 this morning, down from 9.8 yesterday - CBCdiff pending; transfuse as needed to keep hgb >7, shehas received3units of blood thus far - urology recommended outpatient follow-up with repeat CT versus nephrectomy; recommended IR embolization if patient has a rapid decline; appreciate recommendations   Acute renal failure -BUN and Crttrending down; Cr 3.8, BUN 76 - if patient becomes hemodynamically unstable will reconsider  duplex renal US vs MRA vs CTA  - patient diuresed with furosemide 160 mg IV yesterday; good urine output over the past 24 hours ~3400 mL - IR placed a RIJV temporary HD catheter yesterday for possible HD today; will follow with nephrology - Nephrology on board and appreciate their recommendations  HTN - improved with aggressive diuresis, 135/92 - was possibly due to hemorrhagic compression of renal vasculature and volume status -continue to monitor  Dispo: Anticipated discharge in approximately 2-3 day(s).   Mike Craze, DO 09/11/2018, 9:13 AM Pager: [phone]

## 2018-09-11 NOTE — Progress Notes (Addendum)
[facility] Progress Note   Subjective:     Lasix 160 IV yesterday UOP picked up and she made 3.5L yesterday Cr improved Now has RIJ temp HD catheter No further clots  Objective Vitals:   09/10/18 1622 09/10/18 2258 09/11/18 0438 09/11/18 0820  BP: (!) 168/96 138/86 (!) 135/92 (!) 151/92  Pulse: (!) 103 (!) 102 (!) 107 95  Resp: 18 18 18 18   Temp: 97.8 F (36.6 C) 98.8 F (37.1 C) 98.2 F (36.8 C) 98 F (36.7 C)  TempSrc: Oral Oral Oral Oral  SpO2: 99% 95% 97% 98%  Weight:  63.4 kg    Height:       Physical Exam General:  Lying quietly in bed at 45 deg Heart: RRR, no rub Lungs: normal WOB, clear Abdomen: soft, mod distended, +BS Extremities: trace edema diffusely   Additional Objective Labs: Basic Metabolic Panel: Recent Labs  Lab 09/08/18 0529 09/09/18 0409 09/10/18 0652 09/11/18 0658  NA 130* 126* 130* 135  K 4.5 3.9 4.1 3.5  CL 99 103 102 97*  CO2 15* 16* 15* 19*  GLUCOSE 99 136* 106* 144*  BUN 69* 70* 78* 76*  CREATININE 3.99* 4.07* 4.59* 3.80*  CALCIUM 8.6* 7.4* 8.3* 8.9  PHOS 5.9*  --   --   --    Liver Function Tests: Recent Labs  Lab 09/05/18 1619 09/08/18 0529  AST 160*  --   ALT 10  --   ALKPHOS 72  --   BILITOT 1.0  --   PROT 5.8*  --   ALBUMIN 2.6* 2.3*   No results for input(s): LIPASE, AMYLASE in the last 168 hours. CBC: Recent Labs  Lab 09/07/18 1425 09/08/18 0529 09/09/18 0409 09/09/18 2058 09/10/18 0652 09/11/18 0658  WBC 14.4* 19.4* 18.8*  --  24.3* 24.1*  NEUTROABS  --   --   --   --   --  19.6*  HGB 9.0* 8.3* 7.1* 9.4* 9.8* 8.3*  HCT 27.6* 25.8* 21.8* 28.9* 30.4* 25.5*  MCV 84.4 86.6 84.8  --  88.1 85.9  PLT 226 216 226  --  293 357   Blood Culture    Component Value Date/Time   SDES URINE, RANDOM 09/05/2018 1322   SPECREQUEST NONE 09/05/2018 1322   CULT  09/05/2018 1322    NO GROWTH Performed at [facility], [place] [address] [place], [place] [address]    REPTSTATUS 09/06/2018 FINAL  09/05/2018 1322    Cardiac Enzymes: No results for input(s): CKTOTAL, CKMB, CKMBINDEX, TROPONINI in the last 168 hours. CBG: No results for input(s): GLUCAP in the last 168 hours. Iron Studies: No results for input(s): IRON, TIBC, TRANSFERRIN, FERRITIN in the last 72 hours. @lablastinr3 @ Studies/Results: Ir Fluoro Guide Cv Line Right  Result Date: 09/10/2018 INDICATION: Renal failure EXAM: TUNNELED DIALYSIS CATHETER PLACEMENT, ULTRASOUND GUIDANCE FOR VASCULAR ACCESS MEDICATIONS: None ANESTHESIA/SEDATION: None FLUOROSCOPY TIME:  Fluoroscopy Time:  minutes 12 seconds (0.3 mGy). COMPLICATIONS: None immediate. PROCEDURE: Informed written consent was obtained from the patient after a thorough discussion of the procedural risks, benefits and alternatives. All questions were addressed. Maximal Sterile Barrier Technique was utilized including caps, mask, sterile gowns, sterile gloves, sterile drape, hand hygiene and skin antiseptic. A timeout was performed prior to the initiation of the procedure. The right neck was prepped with ChloraPrep in a sterile fashion, and a sterile drape was applied covering the operative field. A sterile gown and sterile gloves were used for the procedure. 1% lidocaine into the  skin and subcutaneous tissue. Under sonographic guidance, a micropuncture needle was inserted into the right internal jugular vein and removed over a 018 wire. The right jugular vein was noted to be patent. Sonographic documentation was obtained. It was up sized to an Amplatz. Twelve [place] dilator followed by a 12 [place] temporary dialysis catheter were advanced over the wire. It was flushed and sewn in place with an 0 Prolene knot. FINDINGS: Imaging documents placement of a temporary right jugular dialysis catheter with its tip at the cavoatrial junction. IMPRESSION: Successful right IJ vein temporary dialysis catheter with its tip in the cavoatrial junction. Electronically Signed   By: Marybelle Killings M.D.    On: 09/10/2018 15:03   Ir US Guide Vasc Access Right  Result Date: 09/10/2018 INDICATION: Renal failure EXAM: TUNNELED DIALYSIS CATHETER PLACEMENT, ULTRASOUND GUIDANCE FOR VASCULAR ACCESS MEDICATIONS: None ANESTHESIA/SEDATION: None FLUOROSCOPY TIME:  Fluoroscopy Time:  minutes 12 seconds (0.3 mGy). COMPLICATIONS: None immediate. PROCEDURE: Informed written consent was obtained from the patient after a thorough discussion of the procedural risks, benefits and alternatives. All questions were addressed. Maximal Sterile Barrier Technique was utilized including caps, mask, sterile gowns, sterile gloves, sterile drape, hand hygiene and skin antiseptic. A timeout was performed prior to the initiation of the procedure. The right neck was prepped with ChloraPrep in a sterile fashion, and a sterile drape was applied covering the operative field. A sterile gown and sterile gloves were used for the procedure. 1% lidocaine into the skin and subcutaneous tissue. Under sonographic guidance, a micropuncture needle was inserted into the right internal jugular vein and removed over a 018 wire. The right jugular vein was noted to be patent. Sonographic documentation was obtained. It was up sized to an Amplatz. Twelve [place] dilator followed by a 12 [place] temporary dialysis catheter were advanced over the wire. It was flushed and sewn in place with an 0 Prolene knot. FINDINGS: Imaging documents placement of a temporary right jugular dialysis catheter with its tip at the cavoatrial junction. IMPRESSION: Successful right IJ vein temporary dialysis catheter with its tip in the cavoatrial junction. Electronically Signed   By: Marybelle Killings M.D.   On: 09/10/2018 15:03   Medications:  . [START ON 09/12/2018] amLODipine  2.5 mg Oral Daily  . feeding supplement (ENSURE ENLIVE)  237 mL Oral BID BM  . furosemide  40 mg Intravenous Once   Assessment/Plan: 1.  AKI:  Normal renal function 09/2017. Suspected ATN from her acute blood  loss anemia -- she did endorse orthostatic symptoms prior to admission and may have had hypotension that was not captured.  Renal function seems to be improving - I expect this is post ATN recovery.  She still has some edema and mild HTN - lasix 40 IV x 1 today.   Has HD catheter now but may end up not needing it.  D/w her today.  Keep for now but if continues to recover will have it removed.  2.  Hemorrhagic renal mass: urology following; suspected malignancy.    3.  Hyponatremia:  Resolved as UOP increased. CTM  4.  Anemia: acute blood loss anemia.  No indication for ESA and would not give in light of suspected malignancy.  Care per primary team.   5.  HTN: no history of HTN but has been fairly hypertensive here - ddx pain, renal artery compression from hematoma, volume expansion.  Started amlodipine 5mg  daily yesterday but also she started diuresing - had rec'd a good bit of volume expansion  with IVF.  Last BP was 130 and she continues to diurese.  I discontinue amlodipine as I wish to avoid hypotension.  If she remains hypertensive tomorrow we can always resume it.   Jannifer Hick MD 09/11/2018, 1:02 PM  [facility] Pager: [phone]

## 2018-09-11 NOTE — Progress Notes (Signed)
Urology Inpatient Progress Report  Dehydration [E86.0] Left renal mass [N28.89] Pilar Plate hematuria [R31.0] Sepsis, due to unspecified organism [A41.9] Acute renal failure, unspecified acute renal failure type ([place]) [N17.9]        Intv/Subj: No acute events overnight. Patient states her pain is little bit better.  Hemoglobin is 8.3 this morning.  Vitals are stable.  Hypertension seems to be improving.  She is afebrile.  Unfortunately, urine output has picked up and creatinine is improving.  She had a temporary HD catheter placed yesterday in the event that she may need dialysis.  Continues to have some mild gross hematuria.  Principal Problem:   Retroperitoneal hematoma Active Problems:   Left renal mass   Pilar Plate hematuria   Acute renal failure (HCC)   Acute blood loss anemia  Current Facility-Administered Medications  Medication Dose Route Frequency Provider Last Rate Last Dose  . calcium carbonate (TUMS - dosed in mg elemental calcium) chewable tablet 200 mg of elemental calcium  1 tablet Oral Q4H PRN Rehman, Areeg N, DO   200 mg of elemental calcium at 09/09/18 1136  . feeding supplement (ENSURE ENLIVE) (ENSURE ENLIVE) liquid 237 mL  237 mL Oral BID BM Rehman, Areeg N, DO   237 mL at 09/11/18 1354  . heparin injection   Intravenous PRN Marybelle Killings, MD   2,800 Units at 09/10/18 1401  . HYDROmorphone (DILAUDID) injection 1 mg  1 mg Intravenous Q2H PRN Kathi Ludwig, MD   1 mg at 09/11/18 1227  . Influenza vac split quadrivalent PF (FLUARIX) injection 0.5 mL  0.5 mL Intramuscular Prior to discharge Annia Belt, MD      . lidocaine (XYLOCAINE) 1 % (with pres) injection   Infiltration PRN Hoss, Arthur, MD   5 mL at 09/10/18 1357  . ondansetron (ZOFRAN) injection 4 mg  4 mg Intravenous Q6H PRN Bloomfield, Carley D, DO   4 mg at 09/06/18 1444  . senna-docusate (Senokot-S) tablet 1 tablet  1 tablet Oral QHS PRN Kalman Shan Ratliff, DO      . simethicone (MYLICON)  chewable tablet 80 mg  80 mg Oral Q6H PRN Kalman Shan Ratliff, DO   80 mg at 09/11/18 0237     Objective: Vital: Vitals:   09/10/18 1622 09/10/18 2258 09/11/18 0438 09/11/18 0820  BP: (!) 168/96 138/86 (!) 135/92 (!) 151/92  Pulse: (!) 103 (!) 102 (!) 107 95  Resp: 18 18 18 18   Temp: 97.8 F (36.6 C) 98.8 F (37.1 C) 98.2 F (36.8 C) 98 F (36.7 C)  TempSrc: Oral Oral Oral Oral  SpO2: 99% 95% 97% 98%  Weight:  63.4 kg    Height:       I/Os: I/O last 3 completed shifts: In: 1129.3 [P.O.:717; IV Piggyback:412.3] Out: 3400 [Urine:3400]  Physical Exam:  General: Patient is in no apparent distress Lungs: Normal respiratory effort, chest expands symmetrically. GI:The abdomen is softer and mildly distended still with palpable mass in the left abdomen. Ext: lower extremities symmetric  Lab Results: Recent Labs    09/09/18 0409 09/09/18 2058 09/10/18 0652 09/11/18 0658  WBC 18.8*  --  24.3* 24.1*  HGB 7.1* 9.4* 9.8* 8.3*  HCT 21.8* 28.9* 30.4* 25.5*   Recent Labs    09/09/18 0409 09/10/18 0652 09/11/18 0658  NA 126* 130* 135  K 3.9 4.1 3.5  CL 103 102 97*  CO2 16* 15* 19*  GLUCOSE 136* 106* 144*  BUN 70* 78* 76*  CREATININE 4.07* 4.59* 3.80*  CALCIUM  7.4* 8.3* 8.9   No results for input(s): LABPT, INR in the last 72 hours. No results for input(s): LABURIN in the last 72 hours. Results for orders placed or performed during the hospital encounter of 09/05/18  Blood Culture (routine x 2)     Status: None   Collection Time: 09/05/18  9:44 AM  Result Value Ref Range Status   Specimen Description BLOOD WEAKLY REACTIVE LEFT  Final   Special Requests   Final    BOTTLES DRAWN AEROBIC ONLY Blood Culture results may not be optimal due to an inadequate volume of blood received in culture bottles   Culture   Final    NO GROWTH 5 DAYS Performed at [facility], [place] [address] [place], [place] [address]    Report Status 09/10/2018 FINAL  Final  Blood Culture  (routine x 2)     Status: None   Collection Time: 09/05/18  9:49 AM  Result Value Ref Range Status   Specimen Description BLOOD LEFT HAND  Final   Special Requests   Final    BOTTLES DRAWN AEROBIC ONLY Blood Culture results may not be optimal due to an inadequate volume of blood received in culture bottles   Culture   Final    NO GROWTH 5 DAYS Performed at [facility], [place] [address], [place], [place] [address]    Report Status 09/10/2018 FINAL  Final  Urine C&S     Status: None   Collection Time: 09/05/18  1:22 PM  Result Value Ref Range Status   Specimen Description URINE, RANDOM  Final   Special Requests NONE  Final   Culture   Final    NO GROWTH Performed at [facility], [place] [address], [place], [place] [address]    Report Status 09/06/2018 FINAL  Final    Studies/Results: Ir Fluoro Guide Cv Line Right  Result Date: 09/10/2018 INDICATION: Renal failure EXAM: TUNNELED DIALYSIS CATHETER PLACEMENT, ULTRASOUND GUIDANCE FOR VASCULAR ACCESS MEDICATIONS: None ANESTHESIA/SEDATION: None FLUOROSCOPY TIME:  Fluoroscopy Time:  minutes 12 seconds (0.3 mGy). COMPLICATIONS: None immediate. PROCEDURE: Informed written consent was obtained from the patient after a thorough discussion of the procedural risks, benefits and alternatives. All questions were addressed. Maximal Sterile Barrier Technique was utilized including caps, mask, sterile gowns, sterile gloves, sterile drape, hand hygiene and skin antiseptic. A timeout was performed prior to the initiation of the procedure. The right neck was prepped with ChloraPrep in a sterile fashion, and a sterile drape was applied covering the operative field. A sterile gown and sterile gloves were used for the procedure. 1% lidocaine into the skin and subcutaneous tissue. Under sonographic guidance, a micropuncture needle was inserted into the right internal jugular vein and removed over a 018 wire. The right jugular vein was noted to be patent.  Sonographic documentation was obtained. It was up sized to an Amplatz. Twelve [place] dilator followed by a 12 [place] temporary dialysis catheter were advanced over the wire. It was flushed and sewn in place with an 0 Prolene knot. FINDINGS: Imaging documents placement of a temporary right jugular dialysis catheter with its tip at the cavoatrial junction. IMPRESSION: Successful right IJ vein temporary dialysis catheter with its tip in the cavoatrial junction. Electronically Signed   By: Marybelle Killings M.D.   On: 09/10/2018 15:03   Ir US Guide Vasc Access Right  Result Date: 09/10/2018 INDICATION: Renal failure EXAM: TUNNELED DIALYSIS CATHETER PLACEMENT, ULTRASOUND GUIDANCE FOR VASCULAR ACCESS MEDICATIONS: None ANESTHESIA/SEDATION: None  FLUOROSCOPY TIME:  Fluoroscopy Time:  minutes 12 seconds (0.3 mGy). COMPLICATIONS: None immediate. PROCEDURE: Informed written consent was obtained from the patient after a thorough discussion of the procedural risks, benefits and alternatives. All questions were addressed. Maximal Sterile Barrier Technique was utilized including caps, mask, sterile gowns, sterile gloves, sterile drape, hand hygiene and skin antiseptic. A timeout was performed prior to the initiation of the procedure. The right neck was prepped with ChloraPrep in a sterile fashion, and a sterile drape was applied covering the operative field. A sterile gown and sterile gloves were used for the procedure. 1% lidocaine into the skin and subcutaneous tissue. Under sonographic guidance, a micropuncture needle was inserted into the right internal jugular vein and removed over a 018 wire. The right jugular vein was noted to be patent. Sonographic documentation was obtained. It was up sized to an Amplatz. Twelve [place] dilator followed by a 12 [place] temporary dialysis catheter were advanced over the wire. It was flushed and sewn in place with an 0 Prolene knot. FINDINGS: Imaging documents placement of a temporary right  jugular dialysis catheter with its tip at the cavoatrial junction. IMPRESSION: Successful right IJ vein temporary dialysis catheter with its tip in the cavoatrial junction. Electronically Signed   By: Marybelle Killings M.D.   On: 09/10/2018 15:03    Assessment: Left retroperitoneal hematoma secondary to renal mass Acute renal insufficiency  Plan: No change in plan.  Continue conservative management with blood transfusion for hemoglobin less than 7.  Hopefully renal function will continue to improve at which point we can consider repeat imaging versus scheduling for elective intervention.  If she has clinical decline, would recommend interventional radiology embolization but she is stable at this time.   Link Snuffer, MD Urology 09/11/2018, 4:14 PM

## 2018-09-12 LAB — CBC
HCT: 22.3 % — ABNORMAL LOW (ref 36.0–46.0)
HEMOGLOBIN: 7.3 g/dL — AB (ref 12.0–15.0)
MCH: 28.1 pg (ref 26.0–34.0)
MCHC: 32.7 g/dL (ref 30.0–36.0)
MCV: 85.8 fL (ref 78.0–100.0)
Platelets: 312 10*3/uL (ref 150–400)
RBC: 2.6 MIL/uL — AB (ref 3.87–5.11)
RDW: 14.6 % (ref 11.5–15.5)
WBC: 20.8 10*3/uL — AB (ref 4.0–10.5)

## 2018-09-12 LAB — BASIC METABOLIC PANEL
ANION GAP: 13 (ref 5–15)
ANION GAP: 12 (ref 5–15)
BUN: 73 mg/dL — ABNORMAL HIGH (ref 6–20)
BUN: 70 mg/dL — ABNORMAL HIGH (ref 6–20)
CHLORIDE: 101 mmol/L (ref 98–111)
CO2: 20 mmol/L — ABNORMAL LOW (ref 22–32)
CO2: 20 mmol/L — ABNORMAL LOW (ref 22–32)
Calcium: 8.1 mg/dL — ABNORMAL LOW (ref 8.9–10.3)
Calcium: 8 mg/dL — ABNORMAL LOW (ref 8.9–10.3)
Chloride: 100 mmol/L (ref 98–111)
Creatinine, Ser: 3.08 mg/dL — ABNORMAL HIGH (ref 0.44–1.00)
Creatinine, Ser: 2.82 mg/dL — ABNORMAL HIGH (ref 0.44–1.00)
GFR calc Af Amer: 20 mL/min — ABNORMAL LOW (ref >60)
GFR calc non Af Amer: 16 mL/min — ABNORMAL LOW (ref >60)
GFR calc non Af Amer: 17 mL/min — ABNORMAL LOW (ref >60)
GFR, EST AFRICAN AMERICAN: 18 mL/min — AB (ref >60)
GLUCOSE: 163 mg/dL — AB (ref 70–99)
Glucose, Bld: 151 mg/dL — ABNORMAL HIGH (ref 70–99)
POTASSIUM: 3.1 mmol/L — AB (ref 3.5–5.1)
POTASSIUM: 3.8 mmol/L (ref 3.5–5.1)
SODIUM: 133 mmol/L — AB (ref 135–145)
Sodium: 133 mmol/L — ABNORMAL LOW (ref 135–145)

## 2018-09-12 MED ORDER — LACTATED RINGERS IV SOLN
INTRAVENOUS | Status: DC
  Administered 2018-09-12: 14:00:00 via INTRAVENOUS

## 2018-09-12 MED ORDER — SODIUM CHLORIDE 0.9% FLUSH
10.0000 mL | INTRAVENOUS | Status: DC | PRN
  Administered 2018-09-13 – 2018-09-19 (×3): 10 mL
  Filled 2018-09-12 (×3): qty 40

## 2018-09-12 MED ORDER — POTASSIUM CHLORIDE 20 MEQ PO PACK
40.0000 meq | PACK | Freq: Two times a day (BID) | ORAL | Status: DC
  Administered 2018-09-12: 40 meq via ORAL
  Filled 2018-09-12: qty 2

## 2018-09-12 NOTE — Consult Note (Signed)
Floor RN states the Renal MD will allow to use IJ since pt is going home tomorrow and she will not need another PIV

## 2018-09-12 NOTE — Progress Notes (Signed)
[facility] Progress Note   Subjective:     Lasix 40 IV yesterday I/Os yesterday  1.4/2.6 Cr improved 3.8 > 3.08 Now has RIJ temp HD catheter No further clots in urine.  Objective Vitals:   09/11/18 2210 09/12/18 0327 09/12/18 0532 09/12/18 0758  BP: (!) 155/88  (!) 142/82 (!) 150/87  Pulse: (!) 106  92 (!) 102  Resp: 16  18 18   Temp: 98.8 F (37.1 C)  98.1 F (36.7 C) 97.6 F (36.4 C)  TempSrc: Oral  Oral Oral  SpO2: 95%  100% 95%  Weight:  63.4 kg 60.6 kg   Height:       Physical Exam General:  Lying quietly in bed flat Heart: RRR, no rub Lungs: normal WOB, clear Abdomen: soft, mod distended, +BS Extremities: trace edema diffusely - improved   Additional Objective Labs: Basic Metabolic Panel: Recent Labs  Lab 09/08/18 0529  09/10/18 0652 09/11/18 0658 09/12/18 0433  NA 130*   < > 130* 135 133*  K 4.5   < > 4.1 3.5 3.1*  CL 99   < > 102 97* 100  CO2 15*   < > 15* 19* 20*  GLUCOSE 99   < > 106* 144* 163*  BUN 69*   < > 78* 76* 73*  CREATININE 3.99*   < > 4.59* 3.80* 3.08*  CALCIUM 8.6*   < > 8.3* 8.9 8.1*  PHOS 5.9*  --   --   --   --    < > = values in this interval not displayed.   Liver Function Tests: Recent Labs  Lab 09/05/18 1619 09/08/18 0529  AST 160*  --   ALT 10  --   ALKPHOS 72  --   BILITOT 1.0  --   PROT 5.8*  --   ALBUMIN 2.6* 2.3*   No results for input(s): LIPASE, AMYLASE in the last 168 hours. CBC: Recent Labs  Lab 09/08/18 0529 09/09/18 0409  09/10/18 0652 09/11/18 0658 09/12/18 0433  WBC 19.4* 18.8*  --  24.3* 24.1* 20.8*  NEUTROABS  --   --   --   --  19.6*  --   HGB 8.3* 7.1*   < > 9.8* 8.3* 7.3*  HCT 25.8* 21.8*   < > 30.4* 25.5* 22.3*  MCV 86.6 84.8  --  88.1 85.9 85.8  PLT 216 226  --  293 357 312   < > = values in this interval not displayed.   Blood Culture    Component Value Date/Time   SDES URINE, RANDOM 09/05/2018 1322   SPECREQUEST NONE 09/05/2018 1322   CULT  09/05/2018 1322    NO  GROWTH Performed at [facility], [place] [address] [place], [place] [address]    REPTSTATUS 09/06/2018 FINAL 09/05/2018 1322    Cardiac Enzymes: No results for input(s): CKTOTAL, CKMB, CKMBINDEX, TROPONINI in the last 168 hours. CBG: No results for input(s): GLUCAP in the last 168 hours. Iron Studies: No results for input(s): IRON, TIBC, TRANSFERRIN, FERRITIN in the last 72 hours. @lablastinr3 @ Studies/Results: Ir Fluoro Guide Cv Line Right  Result Date: 09/10/2018 INDICATION: Renal failure EXAM: TUNNELED DIALYSIS CATHETER PLACEMENT, ULTRASOUND GUIDANCE FOR VASCULAR ACCESS MEDICATIONS: None ANESTHESIA/SEDATION: None FLUOROSCOPY TIME:  Fluoroscopy Time:  minutes 12 seconds (0.3 mGy). COMPLICATIONS: None immediate. PROCEDURE: Informed written consent was obtained from the patient after a thorough discussion of the procedural risks, benefits and alternatives. All questions were addressed. Maximal Sterile Barrier Technique was utilized  including caps, mask, sterile gowns, sterile gloves, sterile drape, hand hygiene and skin antiseptic. A timeout was performed prior to the initiation of the procedure. The right neck was prepped with ChloraPrep in a sterile fashion, and a sterile drape was applied covering the operative field. A sterile gown and sterile gloves were used for the procedure. 1% lidocaine into the skin and subcutaneous tissue. Under sonographic guidance, a micropuncture needle was inserted into the right internal jugular vein and removed over a 018 wire. The right jugular vein was noted to be patent. Sonographic documentation was obtained. It was up sized to an Amplatz. Twelve [place] dilator followed by a 12 [place] temporary dialysis catheter were advanced over the wire. It was flushed and sewn in place with an 0 Prolene knot. FINDINGS: Imaging documents placement of a temporary right jugular dialysis catheter with its tip at the cavoatrial junction. IMPRESSION: Successful right IJ vein  temporary dialysis catheter with its tip in the cavoatrial junction. Electronically Signed   By: Marybelle Killings M.D.   On: 09/10/2018 15:03   Ir US Guide Vasc Access Right  Result Date: 09/10/2018 INDICATION: Renal failure EXAM: TUNNELED DIALYSIS CATHETER PLACEMENT, ULTRASOUND GUIDANCE FOR VASCULAR ACCESS MEDICATIONS: None ANESTHESIA/SEDATION: None FLUOROSCOPY TIME:  Fluoroscopy Time:  minutes 12 seconds (0.3 mGy). COMPLICATIONS: None immediate. PROCEDURE: Informed written consent was obtained from the patient after a thorough discussion of the procedural risks, benefits and alternatives. All questions were addressed. Maximal Sterile Barrier Technique was utilized including caps, mask, sterile gowns, sterile gloves, sterile drape, hand hygiene and skin antiseptic. A timeout was performed prior to the initiation of the procedure. The right neck was prepped with ChloraPrep in a sterile fashion, and a sterile drape was applied covering the operative field. A sterile gown and sterile gloves were used for the procedure. 1% lidocaine into the skin and subcutaneous tissue. Under sonographic guidance, a micropuncture needle was inserted into the right internal jugular vein and removed over a 018 wire. The right jugular vein was noted to be patent. Sonographic documentation was obtained. It was up sized to an Amplatz. Twelve [place] dilator followed by a 12 [place] temporary dialysis catheter were advanced over the wire. It was flushed and sewn in place with an 0 Prolene knot. FINDINGS: Imaging documents placement of a temporary right jugular dialysis catheter with its tip at the cavoatrial junction. IMPRESSION: Successful right IJ vein temporary dialysis catheter with its tip in the cavoatrial junction. Electronically Signed   By: Marybelle Killings M.D.   On: 09/10/2018 15:03   Medications:  . feeding supplement (ENSURE ENLIVE)  237 mL Oral BID BM  . potassium chloride  40 mEq Oral BID   Assessment/Plan: 1.  AKI:  Normal  renal function 09/2017. Suspected ATN from her acute blood loss anemia -- she did endorse orthostatic symptoms prior to admission and may have had hypotension that was not captured.  Renal function seems to be improving - I expect this is post ATN recovery.  She still has some edema and mild HTN but appears much improved.  Hold on diuretic today. Has HD catheter now but no indications to use.  Remove.   2.  Hemorrhagic renal mass: urology following; suspected malignancy.  Outpatient follow up.     3.  Hyponatremia:  Stable 133 today. CTM.    4.  Anemia: acute blood loss anemia.  Has been drifting down over the past few days from 9.8 to 7.3 today.  No indication for ESA and would not  give in light of suspected malignancy.  Care per primary team - she says they plan to give 1 [facility] today.  5.  HTN: no history of HTN but has been fairly hypertensive here - ddx pain, renal artery compression from hematoma, volume expansion.  Holding antiHTN therapy as BP improving with diuresis.  Jannifer Hick MD 09/12/2018, 11:54 AM  [facility] Pager: [phone]

## 2018-09-12 NOTE — Progress Notes (Signed)
   Subjective: Evaluated this morning on rounds. She was sitting up in bed and appeared comfortable. Denies nausea, vomiting or abdominal pain.    Objective:  Vital signs in last 24 hours: Vitals:   09/11/18 2210 09/12/18 0327 09/12/18 0532 09/12/18 0758  BP: (!) 155/88  (!) 142/82 (!) 150/87  Pulse: (!) 106  92 (!) 102  Resp: 16  18 18   Temp: 98.8 F (37.1 C)  98.1 F (36.7 C) 97.6 F (36.4 C)  TempSrc: Oral  Oral Oral  SpO2: 95%  100% 95%  Weight:  63.4 kg 60.6 kg   Height:       Physical Exam  Constitutional: She is well-developed, well-nourished, and in no distress.  Cardiovascular: Normal rate, regular rhythm and normal heart sounds. Exam reveals no gallop and no friction rub.  No murmur heard. Pulmonary/Chest: Effort normal and breath sounds normal. No respiratory distress. She has no wheezes. She has no rales.  Abdominal: Soft. Bowel sounds are normal.  Distention noted although improved from previous exam Minimal Tenderness noted throughout although improved from previous exam     Assessment/Plan:  Principal Problem:   Retroperitoneal hematoma Active Problems:   Left renal mass   Pilar Plate hematuria   Acute renal failure (HCC)   Acute blood loss anemia  Acute blood loss anemia 2/2 retroperitoneal hematoma Patient is hemodynamically stable. Her hemoglobin is currently 7.3 from 8.3.  Patient received a unit of PRBC on 10/2 due to a hemoglobin of 7.1 and and following day 10/3 hemoglobin was 9.8.   On 10/4 the hemoglobin was 8.3 and this appears to be a more accurate reflection of rise in hemoglobin. She is currently 7.3.  No signs of active bleeding.  At this time will monitor and reassess with CBC in the morning. - CBC -transfuse as needed to keep hgb >7, shehas received3units of blood thus far this admission last on 10/2  - if patient becomes hemodynamically unstable recommend IR embolization  Acute renal failure Patient's creatinine is currently 3.08 and  continuing to down trend. Etiology thought to be due to ATN. Nephrology following. She received Lasix for the past 2 days. Appreciate nephrology recommendations.  Will need to closely monitor urine output due to concern for postobstructive diuresis.  For the past 48 hours patient has had an net output of 3.6 L (total output of 6L). She is still up 1 L from admission. Will try and give fluids to match output.   -strict I's and O's - IV fluids - appreciate nephrology recommendations  Hypokalemia K is 3.1 receiving potassium repletion now. This is from diuresis. -Repleting K -Bmet every 12hrs  HTN Improved with diuresis.  Was possibly due to hemorrhagic compression of renal vasculatureand volume status -continue to monitor   Dispo: Anticipated discharge pending clinical improvement.   Kalman Shan Avoca, DO 09/12/2018, 11:54 AM Pager: [phone]

## 2018-09-13 DIAGNOSIS — E876 Hypokalemia: Secondary | ICD-10-CM

## 2018-09-13 LAB — BASIC METABOLIC PANEL
ANION GAP: 9 (ref 5–15)
Anion gap: 9 (ref 5–15)
BUN: 59 mg/dL — ABNORMAL HIGH (ref 6–20)
BUN: 55 mg/dL — AB (ref 6–20)
CALCIUM: 8.2 mg/dL — AB (ref 8.9–10.3)
CHLORIDE: 102 mmol/L (ref 98–111)
CO2: 23 mmol/L (ref 22–32)
CO2: 22 mmol/L (ref 22–32)
CREATININE: 2.4 mg/dL — AB (ref 0.44–1.00)
CREATININE: 2.03 mg/dL — AB (ref 0.44–1.00)
Calcium: 7.9 mg/dL — ABNORMAL LOW (ref 8.9–10.3)
Chloride: 102 mmol/L (ref 98–111)
GFR calc non Af Amer: 21 mL/min — ABNORMAL LOW (ref >60)
GFR, EST AFRICAN AMERICAN: 24 mL/min — AB (ref >60)
GFR, EST AFRICAN AMERICAN: 30 mL/min — AB (ref >60)
GFR, EST NON AFRICAN AMERICAN: 26 mL/min — AB (ref >60)
Glucose, Bld: 131 mg/dL — ABNORMAL HIGH (ref 70–99)
Glucose, Bld: 165 mg/dL — ABNORMAL HIGH (ref 70–99)
POTASSIUM: 3.5 mmol/L (ref 3.5–5.1)
Potassium: 3.6 mmol/L (ref 3.5–5.1)
Sodium: 134 mmol/L — ABNORMAL LOW (ref 135–145)
Sodium: 133 mmol/L — ABNORMAL LOW (ref 135–145)

## 2018-09-13 LAB — HEMOGLOBIN AND HEMATOCRIT, BLOOD
HCT: 24.4 % — ABNORMAL LOW (ref 36.0–46.0)
HEMOGLOBIN: 7.9 g/dL — AB (ref 12.0–15.0)

## 2018-09-13 LAB — CBC
HCT: 20.5 % — ABNORMAL LOW (ref 36.0–46.0)
HEMOGLOBIN: 6.6 g/dL — AB (ref 12.0–15.0)
MCH: 28.3 pg (ref 26.0–34.0)
MCHC: 32.2 g/dL (ref 30.0–36.0)
MCV: 88 fL (ref 78.0–100.0)
PLATELETS: 275 10*3/uL (ref 150–400)
RBC: 2.33 MIL/uL — AB (ref 3.87–5.11)
RDW: 14.6 % (ref 11.5–15.5)
WBC: 20.3 10*3/uL — ABNORMAL HIGH (ref 4.0–10.5)

## 2018-09-13 LAB — PREPARE RBC (CROSSMATCH)

## 2018-09-13 MED ORDER — SODIUM CHLORIDE 0.9% IV SOLUTION
Freq: Once | INTRAVENOUS | Status: AC
  Administered 2018-09-19: 15:00:00 via INTRAVENOUS

## 2018-09-13 MED ORDER — AMLODIPINE BESYLATE 2.5 MG PO TABS
2.5000 mg | ORAL_TABLET | Freq: Every day | ORAL | Status: DC
  Administered 2018-09-13 – 2018-09-14 (×2): 2.5 mg via ORAL
  Filled 2018-09-13 (×2): qty 1

## 2018-09-13 MED ORDER — SODIUM CHLORIDE 0.9% IV SOLUTION: Freq: Once | INTRAVENOUS | Status: DC

## 2018-09-13 NOTE — Progress Notes (Signed)
[facility] Progress Note   Subjective:     Feeling OK - gas pain but no increased flank pain nor passage of clots Hb into 7s - rec 1u pRBC currently I/Os yesterday  1.2/1.7 without diuretic  Objective Vitals:   09/13/18 0500 09/13/18 0832 09/13/18 1011 09/13/18 1050  BP:  (!) 171/91 (!) 152/85 (!) 153/88  Pulse:  95 97 88  Resp:  18 18 18   Temp:  97.8 F (36.6 C) 98.4 F (36.9 C) 98.6 F (37 C)  TempSrc:  Oral Oral Oral  SpO2:  99% 97% 97%  Weight: 60.6 kg     Height:       Physical Exam General:  Lying quietly in bed flat Heart: RRR, no rub Lungs: normal WOB, clear Abdomen: soft, mod distended, +BS Extremities: trace edema diffusely - improved   Additional Objective Labs: Basic Metabolic Panel: Recent Labs  Lab 09/08/18 0529  09/12/18 0433 09/12/18 1407 09/13/18 0329  NA 130*   < > 133* 133* 134*  K 4.5   < > 3.1* 3.8 3.6  CL 99   < > 100 101 102  CO2 15*   < > 20* 20* 23  GLUCOSE 99   < > 163* 151* 131*  BUN 69*   < > 73* 70* 59*  CREATININE 3.99*   < > 3.08* 2.82* 2.40*  CALCIUM 8.6*   < > 8.1* 8.0* 8.2*  PHOS 5.9*  --   --   --   --    < > = values in this interval not displayed.   Liver Function Tests: Recent Labs  Lab 09/08/18 0529  ALBUMIN 2.3*   No results for input(s): LIPASE, AMYLASE in the last 168 hours. CBC: Recent Labs  Lab 09/09/18 0409  09/10/18 0652 09/11/18 0658 09/12/18 0433 09/13/18 0329  WBC 18.8*  --  24.3* 24.1* 20.8* 20.3*  NEUTROABS  --   --   --  19.6*  --   --   HGB 7.1*   < > 9.8* 8.3* 7.3* 6.6*  HCT 21.8*   < > 30.4* 25.5* 22.3* 20.5*  MCV 84.8  --  88.1 85.9 85.8 88.0  PLT 226  --  293 357 312 275   < > = values in this interval not displayed.   Blood Culture    Component Value Date/Time   SDES URINE, RANDOM 09/05/2018 1322   SPECREQUEST NONE 09/05/2018 1322   CULT  09/05/2018 1322    NO GROWTH Performed at [facility], [place] [address] [place], [place] [address]    REPTSTATUS  09/06/2018 FINAL 09/05/2018 1322    Cardiac Enzymes: No results for input(s): CKTOTAL, CKMB, CKMBINDEX, TROPONINI in the last 168 hours. CBG: No results for input(s): GLUCAP in the last 168 hours. Iron Studies: No results for input(s): IRON, TIBC, TRANSFERRIN, FERRITIN in the last 72 hours. @lablastinr3 @ Studies/Results: No results found. Medications:  . sodium chloride   Intravenous Once  . feeding supplement (ENSURE ENLIVE)  237 mL Oral BID BM   Assessment/Plan: 1.  AKI:  Normal renal function 09/2017. Suspected ATN from her acute blood loss anemia -- she did endorse orthostatic symptoms prior to admission and may have had hypotension that was not captured. She is making substantial improvements each day and cr 2.08 today.    She has a temp trialysis catheter that was never used for dialysis but due to difficult IV access is being used for labs and transfusion currently.  It  can be removed when it's no longer needed for venous access.   2.  Hemorrhagic renal mass: urology following; suspected malignancy.  Outpatient follow up planned.  If she were to rebleed IR would be consulted.     3.  Hyponatremia:  Improving as renal function and oral intake improve.  Studies inconsistent with SIADH.   4.  Anemia: acute blood loss anemia.  Has been drifting down over the past few days from 9.8 to 7.3 today.  No indication for ESA and would not give in light of suspected malignancy.  Care per primary team - she says they plan to give 1 [facility] today.  5.  HTN: no history of HTN but has been fairly hypertensive here - ddx pain, ?renal artery compression from hematoma, volume expansion.  Continued hypertension despite diuresis and improved volume status.  Restart amlodipine 2.5mg  daily which can be titrated for effect.    I will sign off at this time.  Please let us know if anything changes while she's hospitalized and we would be happy to be reinvolved.  Otherwise I will make an appt for her to  see me in nephrology clinic in about 6 weeks.  Office will contact her with specifics.   Jannifer Hick MD 09/13/2018, 11:15 AM  [facility] Pager: [phone]

## 2018-09-13 NOTE — Progress Notes (Signed)
   Subjective: Ms. Ezelle reported feeling about the same today. She denied any n/v/d. She said she is urinating well and it is pink in color with no blood clots. She is also still experiencing LUQ and LLQ tightness. Denied any SOB, fevers, cough or swelling.   Objective:  Vital signs in last 24 hours: Vitals:   09/13/18 0500 09/13/18 0832 09/13/18 1011 09/13/18 1050  BP:  (!) 171/91 (!) 152/85 (!) 153/88  Pulse:  95 97 88  Resp:  18 18 18   Temp:  97.8 F (36.6 C) 98.4 F (36.9 C) 98.6 F (37 C)  TempSrc:  Oral Oral Oral  SpO2:  99% 97% 97%  Weight: 60.6 kg     Height:       Physical exam: General- laying in bed in NAD, eating a muffin Heart- RRR, no murmurs Lungs- CTA bilaterally Abdomen- bowel sounds present, distended, LLQ and LUQ tenderness  Extremities- no edema  Assessment/Plan:  Principal Problem:   Retroperitoneal hematoma Active Problems:   Left renal mass   Pilar Plate hematuria   Acute renal failure (HCC)   Acute blood loss anemia  Acute blood loss anemia 2/2 retroperitoneal hematoma - patient is hemodynamically stable. Her hemoglobin is currently 6.6 down from 7.3; ordered 1 unit PRBC - f/u post transfusion CBC - transfuse as needed to keep hgb >7, shehas received4units of blood thus far this admission  - if patient becomes hemodynamically unstable recommend IR embolization  Acute renal failure Patient's creatinine is currently 2.4, continuing to down trend. Etiology thought to be due to ATN. Appreciate nephrology recommendations.  Will need to closely monitor urine output due to concern for postobstructive diuresis. Urine output for the past 24 hours has been 1.7 L. Discontinued fluids -strict I's and O's - appreciate nephrology recommendations  Hypokalemia K 3.6. Repleted yesterday.  -Bmet every 12hrs  HTN Improved with diuresis. Possibly due to hemorrhagic compression of renal vasculatureand volume status. Back up to 160/99 today. - restarted  amlodipine 2.5 mg per nephro -continue to monitor  Dispo: Anticipated discharge in approximately 1-2 day(s).   Caroline Longie N, DO 09/13/2018, 11:35 AM Pager: [phone]

## 2018-09-14 ENCOUNTER — Inpatient Hospital Stay (HOSPITAL_COMMUNITY): Payer: BLUE CROSS/BLUE SHIELD

## 2018-09-14 HISTORY — PX: IR RENAL SELECTIVE  UNI INC S&I MOD SED: IMG654

## 2018-09-14 HISTORY — PX: IR ANGIOGRAM VISCERAL SELECTIVE: IMG657

## 2018-09-14 HISTORY — PX: IR US GUIDE VASC ACCESS RIGHT: IMG2390

## 2018-09-14 HISTORY — PX: IR ANGIO/SPINAL LEFT: IMG2270

## 2018-09-14 LAB — CBC
HCT: 22.2 % — ABNORMAL LOW (ref 36.0–46.0)
HEMATOCRIT: 19.4 % — AB (ref 36.0–46.0)
Hemoglobin: 7.5 g/dL — ABNORMAL LOW (ref 12.0–15.0)
Hemoglobin: 6.6 g/dL — CL (ref 12.0–15.0)
MCH: 29.5 pg (ref 26.0–34.0)
MCH: 29.5 pg (ref 26.0–34.0)
MCHC: 33.8 g/dL (ref 30.0–36.0)
MCHC: 34 g/dL (ref 30.0–36.0)
MCV: 87.4 fL (ref 78.0–100.0)
MCV: 86.6 fL (ref 78.0–100.0)
PLATELETS: 233 10*3/uL (ref 150–400)
PLATELETS: 248 10*3/uL (ref 150–400)
RBC: 2.54 MIL/uL — ABNORMAL LOW (ref 3.87–5.11)
RBC: 2.24 MIL/uL — ABNORMAL LOW (ref 3.87–5.11)
RDW: 13.7 % (ref 11.5–15.5)
RDW: 13.9 % (ref 11.5–15.5)
WBC: 41 10*3/uL — AB (ref 4.0–10.5)
WBC: 36.2 10*3/uL — AB (ref 4.0–10.5)

## 2018-09-14 LAB — HEMOGLOBIN AND HEMATOCRIT, BLOOD
HEMATOCRIT: 21.1 % — AB (ref 36.0–46.0)
HEMOGLOBIN: 7.2 g/dL — AB (ref 12.0–15.0)

## 2018-09-14 LAB — BASIC METABOLIC PANEL
ANION GAP: 10 (ref 5–15)
BUN: 54 mg/dL — AB (ref 6–20)
CALCIUM: 7.9 mg/dL — AB (ref 8.9–10.3)
CO2: 21 mmol/L — ABNORMAL LOW (ref 22–32)
Chloride: 99 mmol/L (ref 98–111)
Creatinine, Ser: 1.85 mg/dL — ABNORMAL HIGH (ref 0.44–1.00)
GFR calc Af Amer: 33 mL/min — ABNORMAL LOW (ref >60)
GFR, EST NON AFRICAN AMERICAN: 29 mL/min — AB (ref >60)
GLUCOSE: 162 mg/dL — AB (ref 70–99)
Potassium: 4 mmol/L (ref 3.5–5.1)
SODIUM: 130 mmol/L — AB (ref 135–145)

## 2018-09-14 LAB — PROTIME-INR
INR: 1.36
PROTHROMBIN TIME: 16.7 s — AB (ref 11.4–15.2)

## 2018-09-14 LAB — MRSA PCR SCREENING: MRSA by PCR: NEGATIVE

## 2018-09-14 LAB — PREPARE RBC (CROSSMATCH)

## 2018-09-14 MED ORDER — LACTATED RINGERS IV SOLN
INTRAVENOUS | Status: DC
  Administered 2018-09-14: 22:00:00 via INTRAVENOUS

## 2018-09-14 MED ORDER — LACTATED RINGERS IV BOLUS
250.0000 mL | Freq: Once | INTRAVENOUS | Status: AC
  Administered 2018-09-15: 250 mL via INTRAVENOUS

## 2018-09-14 MED ORDER — ADULT MULTIVITAMIN W/MINERALS CH
1.0000 | ORAL_TABLET | Freq: Every day | ORAL | Status: DC
  Administered 2018-09-14 – 2018-09-28 (×14): 1 via ORAL
  Filled 2018-09-14 (×15): qty 1

## 2018-09-14 MED ORDER — MIDAZOLAM HCL 2 MG/2ML IJ SOLN
INTRAMUSCULAR | Status: AC | PRN
  Administered 2018-09-14: 0.5 mg via INTRAVENOUS
  Administered 2018-09-14 (×2): 1 mg via INTRAVENOUS
  Administered 2018-09-14: 0.5 mg via INTRAVENOUS

## 2018-09-14 MED ORDER — FENTANYL CITRATE (PF) 100 MCG/2ML IJ SOLN
INTRAMUSCULAR | Status: AC | PRN
  Administered 2018-09-14 (×2): 25 ug via INTRAVENOUS
  Administered 2018-09-14: 50 ug via INTRAVENOUS

## 2018-09-14 MED ORDER — SODIUM CHLORIDE 0.9% IV SOLUTION
Freq: Once | INTRAVENOUS | Status: AC
  Administered 2018-09-15: 03:00:00 via INTRAVENOUS

## 2018-09-14 MED ORDER — FENTANYL CITRATE (PF) 100 MCG/2ML IJ SOLN
INTRAMUSCULAR | Status: AC
  Filled 2018-09-14: qty 4

## 2018-09-14 MED ORDER — LIDOCAINE HCL 1 % IJ SOLN
INTRAMUSCULAR | Status: AC
  Filled 2018-09-14: qty 20

## 2018-09-14 MED ORDER — HYDROMORPHONE HCL 1 MG/ML IJ SOLN
1.0000 mg | INTRAMUSCULAR | Status: DC | PRN
  Administered 2018-09-14 – 2018-09-18 (×30): 2 mg via INTRAVENOUS
  Filled 2018-09-14 (×31): qty 2

## 2018-09-14 MED ORDER — SODIUM CHLORIDE 0.9% IV SOLUTION
Freq: Once | INTRAVENOUS | Status: AC
  Administered 2018-09-14: 17:00:00 via INTRAVENOUS

## 2018-09-14 MED ORDER — MIDAZOLAM HCL 2 MG/2ML IJ SOLN
INTRAMUSCULAR | Status: AC
  Filled 2018-09-14: qty 6

## 2018-09-14 MED ORDER — IODIXANOL 320 MG/ML IV SOLN: 34.0000 mL | Freq: Once | INTRAVENOUS | Status: DC | PRN

## 2018-09-14 NOTE — Progress Notes (Signed)
[facility] Progress Note   Subjective:      Cont to drop Hb, req transfusions  BP stable, afebrile  Not a surgical candidate currenlty  2L UOP yesterday  sCr downtrending  D/w Dr. Beryle Beams, needs control of bleeding; went to IR for angiogram but unable to visualize take off of L Renal artery  Objective Vitals:   09/13/18 2328 09/14/18 0351 09/14/18 0500 09/14/18 0753  BP: (!) 173/99 (!) 144/94  (!) 141/83  Pulse: (!) 101 (!) 108  (!) 108  Resp: (!) 23 (!) 22  18  Temp: (!) 97.5 F (36.4 C) 98.6 F (37 C)  98.1 F (36.7 C)  TempSrc: Oral Oral  Oral  SpO2: 97% 95%  93%  Weight:   63.4 kg   Height:       Physical Exam General:  Lying quietly in bed flat Heart: RRR, no rub Lungs: normal WOB, clear Abdomen: soft, mod distended, +BS Extremities: trace edema diffusely - improved   Additional Objective Labs: Basic Metabolic Panel: Recent Labs  Lab 09/08/18 0529  09/13/18 0329 09/13/18 1604 09/14/18 0305  NA 130*   < > 134* 133* 130*  K 4.5   < > 3.6 3.5 4.0  CL 99   < > 102 102 99  CO2 15*   < > 23 22 21*  GLUCOSE 99   < > 131* 165* 162*  BUN 69*   < > 59* 55* 54*  CREATININE 3.99*   < > 2.40* 2.03* 1.85*  CALCIUM 8.6*   < > 8.2* 7.9* 7.9*  PHOS 5.9*  --   --   --   --    < > = values in this interval not displayed.   Liver Function Tests: Recent Labs  Lab 09/08/18 0529  ALBUMIN 2.3*   No results for input(s): LIPASE, AMYLASE in the last 168 hours. CBC: Recent Labs  Lab 09/11/18 0658 09/12/18 0433 09/13/18 0329 09/13/18 1750 09/14/18 0305 09/14/18 0945  WBC 24.1* 20.8* 20.3*  --  41.0* 36.2*  NEUTROABS 19.6*  --   --   --   --   --   HGB 8.3* 7.3* 6.6* 7.9* 7.5* 6.6*  HCT 25.5* 22.3* 20.5* 24.4* 22.2* 19.4*  MCV 85.9 85.8 88.0  --  87.4 86.6  PLT 357 312 275  --  233 248   Blood Culture    Component Value Date/Time   SDES URINE, RANDOM 09/05/2018 1322   SPECREQUEST NONE 09/05/2018 1322   CULT  09/05/2018 1322    NO  GROWTH Performed at [facility], [place] [address], [place], [place] [address]    REPTSTATUS 09/06/2018 FINAL 09/05/2018 1322    Cardiac Enzymes: No results for input(s): CKTOTAL, CKMB, CKMBINDEX, TROPONINI in the last 168 hours. CBG: No results for input(s): GLUCAP in the last 168 hours. Iron Studies: No results for input(s): IRON, TIBC, TRANSFERRIN, FERRITIN in the last 72 hours. @lablastinr3 @ Studies/Results: No results found. Medications:  . sodium chloride   Intravenous Once  . sodium chloride   Intravenous Once  . sodium chloride   Intravenous Once  . amLODipine  2.5 mg Oral Daily  . feeding supplement (ENSURE ENLIVE)  237 mL Oral BID BM  . multivitamin with minerals  1 tablet Oral Daily   Assessment/Plan: 1.  AKI:  Resolving; baseline normal GFR 09/2017. Suspected 2/2 ATN from her acute blood loss anemia.  Given attempted but unsuccessful embolization of hemorrhagic kidney mass she is at risk of  CIN; will cont to follow in postprocedural state. Has Temp HD cath  2.  Hemorrhagic renal mass: urology following; suspected malignancy.  Immediate tangible risk of hemorrhage outweighed potential nephroxicity of angiogram  3.  Mild Hyponatremia:  Stable, mild.   4.  Anemia: acute blood loss anemia.  Transfusion per primary. No ESA indicated  5.  HTN: no history of HTN but has been fairly hypertensive here - amlodipine  Pearson Grippe 09/14/2018, 1:38 PM  [employer]

## 2018-09-14 NOTE — Progress Notes (Signed)
Blood transfusion completed  at 1935 . Pt remained Afebrile with no acute events noted

## 2018-09-14 NOTE — Consult Note (Signed)
Chief Complaint: Patient was seen in consultation today for spontaneous retroperitoneal bleed, hematoma  Referring Physician(s): Dr. Beryle Beams  Supervising Physician: Marybelle Killings  Patient Status: [facility] - In-pt  History of Present Illness: Sharon Henson is a 57 y.o. female with mo significant past medical history who presented to [facility] ED with LLQ pain, hematuria, nausea, and vomitting.  She was found to have a spontaneous retroperitoneal bleed with associated hematoma from a large renal mass.   CT Renal Stone Study 09/05/18 showed: 1. Very large hemorrhagic mass arising from the left kidney, highly worrisome for renal cell carcinoma. These results were called by telephone at the time of interpretation on 09/05/2018 at 10:34 am to Dr. Jola Schmidt , who verbally acknowledged these results. 2. High attenuation within the left renal pelvis is indicative of hemorrhage/clot, with probable mild associated left hydronephrosis. 3. Small ascites. 4. Small left pleural effusion. 5.  Aortic atherosclerosis (ICD10-170.0).  She was admitted for acute blood loss anemia and further evaluation.  Due to acute renal injury from her blood loss she underwent temporary catheter placement in IR, but has not yet required dialysis.  Her HgB dropped this AM, now s/p blood transfusion this AM.  IR consulted for possible renal angiogram and embolization.   Past Medical History:  Diagnosis Date  . Anemia 09/05/2018  . Complication of anesthesia    "I don't get knocked out w/sodium pentothal" (09/09/2018)  . History of blood transfusion 08/2018; 09/2018   /notes10/01/2018  . History of hiatal hernia   . Renal mass, left    /notes10/01/2018  . Retroperitoneal hematoma    /notes10/01/2018    Past Surgical History:  Procedure Laterality Date  . AUGMENTATION MAMMAPLASTY Bilateral 2006  . Ebro; 1993  . DILATION AND CURETTAGE OF UTERUS  1979   "to remove blood clot in my uterus"  .  HERNIA REPAIR    . IR FLUORO GUIDE CV LINE RIGHT  09/10/2018  . IR US GUIDE VASC ACCESS RIGHT  09/10/2018  . UMBILICAL HERNIA REPAIR  04/08/2007    Allergies: Patient has no known allergies.  Medications: Prior to Admission medications   Not on File     History reviewed. No pertinent family history.  Social History   Socioeconomic History  . Marital status: Married    Spouse name: Not on file  . Number of children: Not on file  . Years of education: Not on file  . Highest education level: Not on file  Occupational History  . Not on file  Social Needs  . Financial resource strain: Not hard at all  . Food insecurity:    Worry: Never true    Inability: Never true  . Transportation needs:    Medical: No    Non-medical: No  Tobacco Use  . Smoking status: Former Smoker    Packs/day: 0.10    Years: 2.00    Pack years: 0.20    Types: Cigarettes  . Smokeless tobacco: Never Used  . Tobacco comment: 09/09/2018 "quit smoking in the late 1980s"  Substance and Sexual Activity  . Alcohol use: Yes    Frequency: Never    Comment: 09/09/2018 "might have 1 drink/month"  . Drug use: Never  . Sexual activity: Not Currently  Lifestyle  . Physical activity:    Days per week: 7 days    Minutes per session: 60 min  . Stress: Not at all  Relationships  . Social connections:    Talks on phone:  More than three times a week    Gets together: More than three times a week    Attends religious service: Never    Active member of club or organization: No    Attends meetings of clubs or organizations: Never    Relationship status: Married  Other Topics Concern  . Not on file  Social History Narrative  . Not on file     Review of Systems: A 12 point ROS discussed and pertinent positives are indicated in the HPI above.  All other systems are negative.  Review of Systems  Constitutional: Positive for fatigue. Negative for fever.  Respiratory: Negative for cough and shortness of breath.    Cardiovascular: Negative for chest pain.  Gastrointestinal: Positive for abdominal pain and nausea. Negative for vomiting.  Genitourinary: Positive for hematuria and urgency. Negative for dysuria.  Musculoskeletal: Positive for back pain.  Psychiatric/Behavioral: Negative for behavioral problems and confusion.    Vital Signs: BP (!) 141/83 (BP Location: Right Arm)   Pulse (!) 108   Temp 98.1 F (36.7 C) (Oral)   Resp 18   Ht 5' (1.524 m)   Wt 139 lb 12.4 oz (63.4 kg)   SpO2 93%   BMI 27.30 kg/m   Physical Exam  Constitutional: She is oriented to person, place, and time. She appears well-developed. No distress.  Neck: Normal range of motion. Neck supple.  Cardiovascular: Regular rhythm. Exam reveals no gallop and no friction rub.  No murmur heard. tachycardia  Pulmonary/Chest: Effort normal and breath sounds normal. No respiratory distress.  Abdominal: She exhibits distension. There is tenderness.  Neurological: She is alert and oriented to person, place, and time.  Skin: Skin is warm and dry. She is not diaphoretic.  Psychiatric: She has a normal mood and affect. Her behavior is normal. Judgment and thought content normal.  Nursing note and vitals reviewed.    MD Evaluation Airway: WNL Heart: WNL Abdomen: WNL Chest/ Lungs: WNL ASA  Classification: 3 Mallampati/Airway Score: Two   Imaging: Ct Chest Wo Contrast  Result Date: 09/05/2018 CLINICAL DATA:  Low back pain, nausea and vomiting. Evaluate for lymphadenopathy. Hemorrhagic renal mass seen on abdominal CT from earlier today. EXAM: CT CHEST WITHOUT CONTRAST TECHNIQUE: Multidetector CT imaging of the chest was performed following the standard protocol without IV contrast. COMPARISON:  CT abdomen pelvis - earlier same day FINDINGS: Cardiovascular: Mild fusiform ectasia of the ascending thoracic aorta measuring 38 mm in diameter (coronal image 38, series 6). Scattered minimal atherosclerotic plaque within the aortic arch  and descending thoracic aorta. The left vertebral artery appears to arise directly from the aortic arch. Borderline cardiomegaly. Trace pericardial fluid. Coronary artery calcifications. Normal caliber the main pulmonary artery. Mediastinum/Nodes: Scattered mediastinal lymph nodes are not enlarged by size criteria with index precarinal lymph node measuring 0.7 cm in greatest short axis diameter (image 48, series 3 and index AP window lymph node measuring 0.8 cm (43, series 3). No bulky mediastinal, hilar axillary lymphadenopathy on this noncontrast examination. Lungs/Pleura: Trace left-sided pleural effusion with associated dependent subpleural ground-glass atelectasis. Minimal right basilar atelectasis. No discrete focal airspace opacities. No pneumothorax. Punctate (approximately 3 mm) noncalcified nodule within the peripheral aspect the left upper lobe (image 63, series 4). The central pulmonary airways appear widely patent. Upper Abdomen: Limited noncontrast evaluation of the upper abdomen demonstrates the cranial aspect of known hemorrhagic left renal mass with associated minimal amount of fluid/hemorrhage within the left upper quadrant as better demonstrated on preceding dedicated abdominal and  pelvic CT. Musculoskeletal: No acute or aggressive osseous abnormalities. Post bilateral breast augmentation. Regional soft tissues appear normal. IMPRESSION: 1. No definite evidence of advanced metastatic disease to the chest. 2. Indeterminate punctate (3 mm) left upper lobe pulmonary nodule. Given the patient's indeterminate hemorrhagic left renal mass, a follow-up non-contrast chest CT can be considered in 12 months is recommended. This recommendation follows the consensus statement: Guidelines for Management of Incidental Pulmonary Nodules Detected on CT Images: From the Fleischner Society 2017; Radiology 2017; 284:228-243. 3. Small potentially reactive left-sided pleural effusion. 4.  Aortic Atherosclerosis  (ICD10-I70.0). Electronically Signed   By: Sandi Mariscal M.D.   On: 09/05/2018 13:01   US Renal  Result Date: 09/08/2018 CLINICAL DATA:  Acute kidney injury, hemorrhagic left renal mass EXAM: RENAL / URINARY TRACT ULTRASOUND COMPLETE COMPARISON:  Abdominal and pelvic CT scan of September 05, 2018 FINDINGS: Right Kidney: Length: 11.5 cm. The renal cortical echotexture is slightly greater than that of the adjacent liver. There is no hydronephrosis nor parenchymal mass. Left Kidney: Length: There is a large heterogeneous echotexture mass occupying the left renal fossa. It measures 16.8 x 12.3 x 15.9 cm. Normal renal architecture is not demonstrated. Bladder: The partially distended urinary bladder is normal. IMPRESSION: Large heterogeneous echotexture mass occupying the left renal fossa consistent with the CT findings. Increased renal cortical echotexture on the right consistent with medical renal disease. No hydronephrosis. Normal appearing urinary bladder. Electronically Signed   By: David  Martinique M.D.   On: 09/08/2018 12:20   Ir Fluoro Guide Cv Line Right  Result Date: 09/10/2018 INDICATION: Renal failure EXAM: TUNNELED DIALYSIS CATHETER PLACEMENT, ULTRASOUND GUIDANCE FOR VASCULAR ACCESS MEDICATIONS: None ANESTHESIA/SEDATION: None FLUOROSCOPY TIME:  Fluoroscopy Time:  minutes 12 seconds (0.3 mGy). COMPLICATIONS: None immediate. PROCEDURE: Informed written consent was obtained from the patient after a thorough discussion of the procedural risks, benefits and alternatives. All questions were addressed. Maximal Sterile Barrier Technique was utilized including caps, mask, sterile gowns, sterile gloves, sterile drape, hand hygiene and skin antiseptic. A timeout was performed prior to the initiation of the procedure. The right neck was prepped with ChloraPrep in a sterile fashion, and a sterile drape was applied covering the operative field. A sterile gown and sterile gloves were used for the procedure. 1% lidocaine  into the skin and subcutaneous tissue. Under sonographic guidance, a micropuncture needle was inserted into the right internal jugular vein and removed over a 018 wire. The right jugular vein was noted to be patent. Sonographic documentation was obtained. It was up sized to an Amplatz. Twelve [place] dilator followed by a 12 [place] temporary dialysis catheter were advanced over the wire. It was flushed and sewn in place with an 0 Prolene knot. FINDINGS: Imaging documents placement of a temporary right jugular dialysis catheter with its tip at the cavoatrial junction. IMPRESSION: Successful right IJ vein temporary dialysis catheter with its tip in the cavoatrial junction. Electronically Signed   By: Marybelle Killings M.D.   On: 09/10/2018 15:03   Ir US Guide Vasc Access Right  Result Date: 09/10/2018 INDICATION: Renal failure EXAM: TUNNELED DIALYSIS CATHETER PLACEMENT, ULTRASOUND GUIDANCE FOR VASCULAR ACCESS MEDICATIONS: None ANESTHESIA/SEDATION: None FLUOROSCOPY TIME:  Fluoroscopy Time:  minutes 12 seconds (0.3 mGy). COMPLICATIONS: None immediate. PROCEDURE: Informed written consent was obtained from the patient after a thorough discussion of the procedural risks, benefits and alternatives. All questions were addressed. Maximal Sterile Barrier Technique was utilized including caps, mask, sterile gowns, sterile gloves, sterile drape, hand hygiene and skin antiseptic.  A timeout was performed prior to the initiation of the procedure. The right neck was prepped with ChloraPrep in a sterile fashion, and a sterile drape was applied covering the operative field. A sterile gown and sterile gloves were used for the procedure. 1% lidocaine into the skin and subcutaneous tissue. Under sonographic guidance, a micropuncture needle was inserted into the right internal jugular vein and removed over a 018 wire. The right jugular vein was noted to be patent. Sonographic documentation was obtained. It was up sized to an Amplatz. Twelve  [place] dilator followed by a 12 [place] temporary dialysis catheter were advanced over the wire. It was flushed and sewn in place with an 0 Prolene knot. FINDINGS: Imaging documents placement of a temporary right jugular dialysis catheter with its tip at the cavoatrial junction. IMPRESSION: Successful right IJ vein temporary dialysis catheter with its tip in the cavoatrial junction. Electronically Signed   By: Marybelle Killings M.D.   On: 09/10/2018 15:03   Ct Renal Stone Study  Result Date: 09/05/2018 CLINICAL DATA:  Fever and nausea since Wednesday. EXAM: CT ABDOMEN AND PELVIS WITHOUT CONTRAST TECHNIQUE: Multidetector CT imaging of the abdomen and pelvis was performed following the standard protocol without IV contrast. COMPARISON:  None. FINDINGS: Lower chest: Small left pleural effusion with mild compressive atelectasis in the left lower lobe. Heart size normal. No pericardial effusion. Hepatobiliary: Liver and gallbladder are unremarkable. No biliary ductal dilatation. Pancreas: Negative. Spleen: Negative. Adrenals/Urinary Tract: Adrenal glands and right kidney are unremarkable. There is a very large heterogeneous mass with internal hemorrhage arising from the left kidney, measuring 12.4 x 15.6 cm. Perianal stranding and fluid on the left. Hemorrhage is seen in the left renal pelvis. There may be mild left hydronephrosis. Ureters are decompressed. Bladder is very low in volume. Stomach/Bowel: Small hiatal hernia. Stomach, small bowel and colon are unremarkable. Favor fluid-filled small bowel in the dependent anatomic pelvis over free fluid. Vascular/Lymphatic: Atherosclerotic calcification of the arterial vasculature without abdominal aortic aneurysm. No definite pathologically enlarged lymph nodes. Reproductive: Uterus is visualized.  No adnexal mass. Other: Small ascites. Musculoskeletal: No worrisome lytic or sclerotic lesions. IMPRESSION: 1. Very large hemorrhagic mass arising from the left kidney, highly  worrisome for renal cell carcinoma. These results were called by telephone at the time of interpretation on 09/05/2018 at 10:34 am to Dr. Jola Schmidt , who verbally acknowledged these results. 2. High attenuation within the left renal pelvis is indicative of hemorrhage/clot, with probable mild associated left hydronephrosis. 3. Small ascites. 4. Small left pleural effusion. 5.  Aortic atherosclerosis (ICD10-170.0). Electronically Signed   By: Lorin Picket M.D.   On: 09/05/2018 10:34    Labs:  CBC: Recent Labs    09/12/18 0433 09/13/18 0329 09/13/18 1750 09/14/18 0305 09/14/18 0945  WBC 20.8* 20.3*  --  41.0* 36.2*  HGB 7.3* 6.6* 7.9* 7.5* 6.6*  HCT 22.3* 20.5* 24.4* 22.2* 19.4*  PLT 312 275  --  233 248    COAGS: Recent Labs    09/06/18 1519  INR 1.25  APTT 31    BMP: Recent Labs    09/12/18 1407 09/13/18 0329 09/13/18 1604 09/14/18 0305  NA 133* 134* 133* 130*  K 3.8 3.6 3.5 4.0  CL 101 102 102 99  CO2 20* 23 22 21*  GLUCOSE 151* 131* 165* 162*  BUN 70* 59* 55* 54*  CALCIUM 8.0* 8.2* 7.9* 7.9*  CREATININE 2.82* 2.40* 2.03* 1.85*  GFRNONAA 17* 21* 26* 29*  GFRAA 20* 24*  30* 33*    LIVER FUNCTION TESTS: Recent Labs    09/05/18 1619 09/08/18 0529  BILITOT 1.0  --   AST 160*  --   ALT 10  --   ALKPHOS 72  --   PROT 5.8*  --   ALBUMIN 2.6* 2.3*    TUMOR MARKERS: No results for input(s): AFPTM, CEA, CA199, CHROMGRNA in the last 8760 hours.  Assessment and Plan: Retroperitoneal bleed/hematoma Renal mass Patient admitted with acute blood loss anemia and kidney injury. She has a temp cath in place for dialysis if needed.  Her SCr has slowly started to improve.  Her HgB has trended down and she has required several blood transfusions.  Last transfusion was today for a Hgb of 6.6  IR consulted for renal angiogram with possible embolization. Case discussed between Dr. Barbie Banner, Dr. Beryle Beams, and urology. Plan to proceed with angiogram and possible  embolization today.  Patient last had sips of water around 10AM.  INR pending, however no blood thinners since admission.   Risks and benefits were discussed with the patient including, but not limited to bleeding, infection, vascular injury or contrast induced renal failure.  This interventional procedure involves the use of X-rays and because of the nature of the planned procedure, it is possible that we will have prolonged use of X-ray fluoroscopy.  Potential radiation risks to you include (but are not limited to) the following: - A slightly elevated risk for cancer  several years later in life. This risk is typically less than 0.5% percent. This risk is low in comparison to the normal incidence of human cancer, which is 33% for women and 50% for men according to the [place]. - Radiation induced injury can include skin redness, resembling a rash, tissue breakdown / ulcers and hair loss (which can be temporary or permanent).   The likelihood of either of these occurring depends on the difficulty of the procedure and whether you are sensitive to radiation due to previous procedures, disease, or genetic conditions.   IF your procedure requires a prolonged use of radiation, you will be notified and given written instructions for further action.  It is your responsibility to monitor the irradiated area for the 2 weeks following the procedure and to notify your physician if you are concerned that you have suffered a radiation induced injury.    All of the patient's questions were answered, patient is agreeable to proceed.  Consent signed and in chart.  Thank you for this interesting consult.  I greatly enjoyed meeting Sharon Henson and look forward to participating in their care.  A copy of this report was sent to the requesting provider on this date.  Electronically Signed: Docia Barrier, PA 09/14/2018, 1:30 PM   I spent a total of 40 Minutes    in face to face in  clinical consultation, greater than 50% of which was counseling/coordinating care for retroperitoneal bleed, hematoma.

## 2018-09-14 NOTE — Progress Notes (Addendum)
   Subjective: Ms. Ladd reported feeling worse today. She is feeling gaseous and bloated and her abdominal pain is still present. She is tolerating PO intake with some nausea. She is still urinating okay and the color is pink. She reported she cannot go home feeling this way.   Objective:  Vital signs in last 24 hours: Vitals:   09/13/18 2115 09/13/18 2328 09/14/18 0351 09/14/18 0500  BP: 127/86 (!) 173/99 (!) 144/94   Pulse: (!) 114 (!) 101 (!) 108   Resp: 15 (!) 23 (!) 22   Temp: 98 F (36.7 C) (!) 97.5 F (36.4 C) 98.6 F (37 C)   TempSrc:  Oral Oral   SpO2: 96% 97% 95%   Weight: 63.4 kg   63.4 kg  Height:       General- seen laying in bed, uncomfortable Heart- RRR, no murmurs Lungs- CTA bilaterally, normal effort Abdomen- increased distension, LUQ and LLQ tenderness Extremities- no edema  Assessment/Plan:  Principal Problem:   Retroperitoneal hematoma Active Problems:   Left renal mass   Pilar Plate hematuria   Acute renal failure (HCC)   Acute blood loss anemia  Ms. Converse is a 57 yo female with no PMHx who presented with acute onset left sided flank pain, abdominal distension and hematuria for three days. She was found to havea hemorrhagic mass arising from the left kidney, worrisome for RCC.  Acute blood loss anemia 2/2 retroperitoneal hematoma - patient is hypertensive, mildly tachycardic and tachypneic; pale on exam and abdomen more distended. Her hemoglobin is currently 7.5 down from 7.9; ordered 2 unit PRBC yesterday - repeat am CBC showed Hgb 6.6 - transfuse as needed to keep hgb >7, shehas received4units of blood thus farthis admission - if patient becomes hemodynamically unstable recommend IR embolization - will follow up with Urology for possible intervention given patient is not improving and hemoglobin continues to drop; will also consult IR for possible embolization recs  Acute renal failure Patient's creatinine is currently 1.85, continuing to down  trend. Etiology thought to be due to ATN. Appreciate nephrology recommendations. Will need to closely monitor urine output due to concern for postobstructive diuresis. Urine output for the past 24 hours has been ~2 L. Discontinued fluids -strict I's and O's  Hypokalemia K 4.0. Resolved. -Bmet every 12hrs  HTN Initially improved with diuresis. Possibly due to hemorrhagic compression of renal vasculatureand volume status. 144/94 today - restarted amlodipine 2.5 mg per nephro -continue to monitor  Dispo: Anticipated discharge in approximately 1-2 day(s).   Mike Craze, DO 09/14/2018, 6:53 AM Pager: [phone]

## 2018-09-14 NOTE — Procedures (Signed)
Renal angiogram for Left Perinephric Hematoma  Vessels injected - Aortogram (CO2 and Contrast), [place], SMA, Right Renal Artery, Two Left Lumbar Arteries.  The Left Renal Artery could not be identified by selective injection or aortogram.  No embolization  EBL 0  Comp 0

## 2018-09-14 NOTE — Progress Notes (Signed)
Examined and evaluated patient at bedside in the evening for persistent tachycardia and tachypnea. Patient was observed laying in bed appearing in distress. She states she is continuing to endorse 10/10 left flank pain and dyspnea. Observed satting 94 on 1L O2 and on Room air but she states she feels the oxygen is helping. Her husband states her dyspnea appears to improve when sitting up, but she feels comfortably lying supine. Mentions that whenever she has flatus or bowel gases, pain appears to worsen. Denies any F/N/V/D/C. Telemetery was reviewed which showed only sinus tachycardia.  Today's Vitals   09/14/18 1633 09/14/18 1712 09/14/18 1800 09/14/18 1950  BP: (!) 153/94 (!) 143/90 (!) 151/93 (!) 156/95  Pulse: (!) 124 (!) 122 (!) 129 (!) 133  Resp: 18 18 (!) 30 (!) 33  Temp: 98.9 F (37.2 C) 98.9 F (37.2 C) 98.9 F (37.2 C) 98.3 F (36.8 C)  TempSrc: Oral Oral Oral Oral  SpO2: 92%  92% 92%  Weight:      Height:      PainSc:        Gen: Pale-appearing female in acute distress HEENT: hearing intact, EOMI, PERRL, No nasal discharge, pale mucosal membranes Neck: supple, ROM intact, no JVD, no cervical adenopathy CV: Tachycardic heart sounds, S1, S2 normal, No rubs, no murmurs, no gallops Pulm: CTAB, No rales, no wheezes,. No rhonchi Abd: Distended abdomen, BS+, Tenderness to palpation left flank, + left sided mass Extm:Peripheral pulses intact, Trace pitting edema bilateral lower extremities Neuro: AAOx3, Cranial Nerve II-XII intact, DTR intact  Sinus tachycardia/tachypnea 2/2 acute blood loss anemia and L flank pain Just finished getting a unit of blood this afternoon. Afebrile. Last hgb pre-transfusion 6.6. HR consistently above 120. RR >30 No rales on exam. O2 sat WNL. Most likely inadequate pain control & anemia causing abnormal vitals. Low suspicion of TRALI or TACO - F/u post transfusion H&H - Transfuse additional pRBC if hgb <7 - Appreciate urology recs - Increase pain  regimen to Dilaudid 1-2mg  2hr PRN  Gilberto Better, PGY1 Pager: [phone]

## 2018-09-14 NOTE — Progress Notes (Signed)
Patient arrived to 5w31 from 5MW. 1Unit of PRBC transfusing. Patient placed on stepdown monitor #10. Patient resting in bed with husband at bedside.

## 2018-09-14 NOTE — Sedation Documentation (Signed)
Patient is resting comfortably. 

## 2018-09-14 NOTE — Progress Notes (Signed)
Initial Nutrition Assessment  DOCUMENTATION CODES:   Not applicable  INTERVENTION:   Continue Ensure Enlive BID. Begin MVI with minerals given blood loss and anemia  NUTRITION DIAGNOSIS:   Inadequate oral intake related to poor appetite, acute illness as evidenced by per patient/family report, estimated needs.  GOAL:   Patient will meet greater than or equal to 90% of their needs  MONITOR:   PO intake, Labs, Diet advancement, Supplement acceptance  REASON FOR ASSESSMENT:   LOS   ASSESSMENT:   57 yo female, admitted with acute blood loss anemia d/t spontaneous retroperitoneal bleed and hematoma, complicated by acute renal failure. PMHx includes similar symptoms one year ago (suspected renal stone, symptoms resolved on their own), smoker.  Labs: sodium 130, glucose 162, BUN 54 (trending down), Creatinine 1.85 (trending down) Meds: mylicon   Pt uncomfortable at time of visit. Reports diarrhea; denies nausea or vomiting, or difficulty chewing or swallowing. Per nsg, 75-100% PO intake - however, pt states she is not hungry and does not want the food being sent up. Reports drinking milk yesterday, which gave her gas. Pt reports UBW 125# (56.8 kg), stable until now. States her appetite is normally good and she typically eats 3 meals/day. Encouraged her to drink the Ensures - she said she likes them and tries to drink them with ice water. Suggested trying them over ice if they're too thick or warm.  NUTRITION - FOCUSED PHYSICAL EXAM:    Most Recent Value  Orbital Region  No depletion  Upper Arm Region  No depletion  Thoracic and Lumbar Region  No depletion  Buccal Region  No depletion  Temple Region  Mild depletion  Clavicle Bone Region  No depletion  Clavicle and Acromion Bone Region  No depletion  Scapular Bone Region  No depletion  Dorsal Hand  No depletion  Patellar Region  No depletion  Anterior Thigh Region  No depletion  Posterior Calf Region  No depletion  Edema (RD  Assessment)  None  Hair  Reviewed  Eyes  Reviewed  Mouth  Reviewed  Skin  Reviewed  Nails  Unable to assess     Diet Order:   Diet Order            Diet full liquid Room service appropriate? Yes; Fluid consistency: Thin  Diet effective now             EDUCATION NEEDS:   No education needs have been identified at this time  Skin:  Skin Assessment: Reviewed RN Assessment  Last BM:  10/3, type 6 (mushy)  Height:   Ht Readings from Last 1 Encounters:  09/05/18 5' (1.524 m)   Weight:   Wt Readings from Last 1 Encounters:  09/14/18 63.4 kg   Ideal Body Weight:  51.3 kg  BMI:  Body mass index is 27.3 kg/m.  Estimated Nutritional Needs:   Kcal:  4193 - 7902 calories/day(25-30 kcal/kg IBW)  Protein:  51-67 g protein/day(1-1.3 g/kg IBW)  Fluid:  1 mL/kcal  Althea Grimmer, MS, RDN, LDN On-call pager: [phone]

## 2018-09-14 NOTE — Progress Notes (Addendum)
Urology Inpatient Progress Report  Dehydration [E86.0] Left renal mass [N28.89] Pilar Plate hematuria [R31.0] Sepsis, due to unspecified organism [A41.9] Acute renal failure, unspecified acute renal failure type ([place]) [N17.9]        Intv/Subj: Initially, last week the patient's hemoglobin was stable following transfusion.  However, over the past several days, her hemoglobin has drifted down and she has blood transfusion dependent.  This prompted attempt at embolization today given that her renal function has improved to some degree and she is requiring transfusions.  Unfortunately, the left renal artery could not be identified.  Therefore embolization was unsuccessful.  The patient is currently recovering from sedation and from the procedure.  Her husband is present in the room.  Principal Problem:   Retroperitoneal hematoma Active Problems:   Left renal mass   Pilar Plate hematuria   Acute renal failure (HCC)   Acute blood loss anemia  Current Facility-Administered Medications  Medication Dose Route Frequency Provider Last Rate Last Dose  . 0.9 %  sodium chloride infusion (Manually program via Guardrails IV Fluids)   Intravenous Once Rehman, Areeg N, DO      . 0.9 %  sodium chloride infusion (Manually program via Guardrails IV Fluids)   Intravenous Once Rehman, Areeg N, DO      . 0.9 %  sodium chloride infusion (Manually program via Guardrails IV Fluids)   Intravenous Once Rehman, Areeg N, DO      . amLODipine (NORVASC) tablet 2.5 mg  2.5 mg Oral Daily Justin Mend, MD   2.5 mg at 09/14/18 1037  . calcium carbonate (TUMS - dosed in mg elemental calcium) chewable tablet 200 mg of elemental calcium  1 tablet Oral Q4H PRN Rehman, Areeg N, DO   200 mg of elemental calcium at 09/09/18 1136  . feeding supplement (ENSURE ENLIVE) (ENSURE ENLIVE) liquid 237 mL  237 mL Oral BID BM Rehman, Areeg N, DO   237 mL at 09/14/18 1048  . fentaNYL (SUBLIMAZE) 100 MCG/2ML injection           . HYDROmorphone  (DILAUDID) injection 1 mg  1 mg Intravenous Q2H PRN Kathi Ludwig, MD   1 mg at 09/14/18 1634  . Influenza vac split quadrivalent PF (FLUARIX) injection 0.5 mL  0.5 mL Intramuscular Prior to discharge Annia Belt, MD      . iodixanol (VISIPAQUE) 320 MG/ML injection 34 mL  34 mL Intravenous Once PRN Hoss, Arthur, MD      . lactated ringers bolus 250 mL  250 mL Intravenous Once Rehman, Areeg N, DO      . lactated ringers infusion   Intravenous Continuous Rehman, Areeg N, DO      . lidocaine (XYLOCAINE) 1 % (with pres) injection           . midazolam (VERSED) 2 MG/2ML injection           . multivitamin with minerals tablet 1 tablet  1 tablet Oral Daily Annia Belt, MD      . ondansetron [facility]) injection 4 mg  4 mg Intravenous Q6H PRN [place], Carley D, DO   4 mg at 09/06/18 1444  . senna-docusate (Senokot-S) tablet 1 tablet  1 tablet Oral QHS PRN Kalman Shan Ratliff, DO      . simethicone (MYLICON) chewable tablet 80 mg  80 mg Oral Q6H PRN Kalman Shan Ratliff, DO   80 mg at 09/14/18 1638  . sodium chloride flush (NS) 0.9 % injection 10-40 mL  10-40 mL Intracatheter PRN  Justin Mend, MD   10 mL at 09/13/18 2028     Objective: Vital: Vitals:   09/14/18 1525 09/14/18 1532 09/14/18 1624 09/14/18 1633  BP: (!) 150/103 (!) 169/96 (!) 153/95 (!) 153/94  Pulse: (!) 117 (!) 117 (!) 120 (!) 124  Resp: 20 20 18 18   Temp:   98.3 F (36.8 C) 98.9 F (37.2 C)  TempSrc:   Oral Oral  SpO2: 97% 99% 91% 92%  Weight:      Height:       I/Os: I/O last 3 completed shifts: In: 2272.5 [P.O.:960; I.V.:600; Blood:712.5] Out: 3225 [Urine:3225]  Physical Exam:  General: Patient is in no apparent distress Lungs: Normal respiratory effort, chest expands symmetrically. GI:The abdomen is soft on the right and distended, slightly more than prior still with palpable mass in the left abdomen. Ext: lower extremities symmetric  Lab Results: Recent Labs     09/13/18 0329 09/13/18 1750 09/14/18 0305 09/14/18 0945  WBC 20.3*  --  41.0* 36.2*  HGB 6.6* 7.9* 7.5* 6.6*  HCT 20.5* 24.4* 22.2* 19.4*   Recent Labs    09/13/18 0329 09/13/18 1604 09/14/18 0305  NA 134* 133* 130*  K 3.6 3.5 4.0  CL 102 102 99  CO2 23 22 21*  GLUCOSE 131* 165* 162*  BUN 59* 55* 54*  CREATININE 2.40* 2.03* 1.85*  CALCIUM 8.2* 7.9* 7.9*   Recent Labs    09/14/18 1513  INR 1.36   No results for input(s): LABURIN in the last 72 hours. Results for orders placed or performed during the hospital encounter of 09/05/18  Blood Culture (routine x 2)     Status: None   Collection Time: 09/05/18  9:44 AM  Result Value Ref Range Status   Specimen Description BLOOD WEAKLY REACTIVE LEFT  Final   Special Requests   Final    BOTTLES DRAWN AEROBIC ONLY Blood Culture results may not be optimal due to an inadequate volume of blood received in culture bottles   Culture   Final    NO GROWTH 5 DAYS Performed at [facility], [place] [address], [place], [place] [address]    Report Status 09/10/2018 FINAL  Final  Blood Culture (routine x 2)     Status: None   Collection Time: 09/05/18  9:49 AM  Result Value Ref Range Status   Specimen Description BLOOD LEFT HAND  Final   Special Requests   Final    BOTTLES DRAWN AEROBIC ONLY Blood Culture results may not be optimal due to an inadequate volume of blood received in culture bottles   Culture   Final    NO GROWTH 5 DAYS Performed at [facility], [place] [address] [place], [place] [address]    Report Status 09/10/2018 FINAL  Final  Urine C&S     Status: None   Collection Time: 09/05/18  1:22 PM  Result Value Ref Range Status   Specimen Description URINE, RANDOM  Final   Special Requests NONE  Final   Culture   Final    NO GROWTH Performed at [facility], [place] [address] [place], [place] [address]    Report Status 09/06/2018 FINAL  Final    Studies/Results: No results found.  Assessment: Left  renal mass with left retroperitoneal hematoma .  Acute renal insufficiency, improving  Plan: Unfortunately, the patient has been blood transfusion dependent over the past couple of days.  Embolization has failed.  Agree with transfusion for goal  hemoglobin greater than 7.  We will continue to monitor.  Tentatively have her on the schedule for an open left radical nephrectomy on Tuesday 10/15 and hopefully can get her medically optimized up to that time.  However, will reassess daily and can proceed more urgently if necessary.  I could not fully discussed this with the patient today since she was getting over anesthesia/sedation but will reassess tomorrow. Do not recommend CTA currently as I do not think it would change the plan of management and could worsen renal insufficiency   Link Snuffer, MD Urology 09/14/2018, 5:40 PM

## 2018-09-15 ENCOUNTER — Other Ambulatory Visit: Payer: Self-pay | Admitting: Urology

## 2018-09-15 ENCOUNTER — Inpatient Hospital Stay (HOSPITAL_COMMUNITY): Payer: BLUE CROSS/BLUE SHIELD

## 2018-09-15 ENCOUNTER — Encounter (HOSPITAL_COMMUNITY): Payer: Self-pay | Admitting: Interventional Radiology

## 2018-09-15 LAB — BASIC METABOLIC PANEL
Anion gap: 11 (ref 5–15)
BUN: 42 mg/dL — ABNORMAL HIGH (ref 6–20)
CALCIUM: 8.2 mg/dL — AB (ref 8.9–10.3)
CHLORIDE: 98 mmol/L (ref 98–111)
CO2: 23 mmol/L (ref 22–32)
CREATININE: 1.72 mg/dL — AB (ref 0.44–1.00)
GFR calc non Af Amer: 31 mL/min — ABNORMAL LOW (ref >60)
GFR, EST AFRICAN AMERICAN: 36 mL/min — AB (ref >60)
Glucose, Bld: 99 mg/dL (ref 70–99)
Potassium: 4 mmol/L (ref 3.5–5.1)
Sodium: 132 mmol/L — ABNORMAL LOW (ref 135–145)

## 2018-09-15 LAB — HEMOGLOBIN AND HEMATOCRIT, BLOOD
HCT: 24.3 % — ABNORMAL LOW (ref 36.0–46.0)
HEMOGLOBIN: 8.3 g/dL — AB (ref 12.0–15.0)

## 2018-09-15 LAB — CBC
HCT: 24.1 % — ABNORMAL LOW (ref 36.0–46.0)
Hemoglobin: 8.2 g/dL — ABNORMAL LOW (ref 12.0–15.0)
MCH: 29.6 pg (ref 26.0–34.0)
MCHC: 34 g/dL (ref 30.0–36.0)
MCV: 87 fL (ref 80.0–100.0)
PLATELETS: 217 10*3/uL (ref 150–400)
RBC: 2.77 MIL/uL — AB (ref 3.87–5.11)
RDW: 14.5 % (ref 11.5–15.5)
WBC: 47.2 10*3/uL — ABNORMAL HIGH (ref 4.0–10.5)

## 2018-09-15 LAB — PREPARE RBC (CROSSMATCH)

## 2018-09-15 MED ORDER — FUROSEMIDE 10 MG/ML IJ SOLN
40.0000 mg | Freq: Once | INTRAMUSCULAR | Status: AC
  Administered 2018-09-15: 40 mg via INTRAVENOUS
  Filled 2018-09-15: qty 4

## 2018-09-15 MED ORDER — AMLODIPINE BESYLATE 5 MG PO TABS
5.0000 mg | ORAL_TABLET | Freq: Every day | ORAL | Status: DC
  Filled 2018-09-15: qty 1

## 2018-09-15 MED ORDER — SODIUM CHLORIDE 0.9 % IV SOLN
INTRAVENOUS | Status: DC | PRN
  Administered 2018-09-15 – 2018-09-19 (×2): 250 mL via INTRAVENOUS
  Administered 2018-09-20 – 2018-09-21 (×2): 1000 mL via INTRAVENOUS

## 2018-09-15 NOTE — Progress Notes (Signed)
Urology Inpatient Progress Report  Dehydration [E86.0] Left renal mass [N28.89] Pilar Plate hematuria [R31.0] Sepsis, due to unspecified organism [A41.9] Acute renal failure, unspecified acute renal failure type ([place]) [N17.9]        Intv/Subj: Embolization was unsuccessful yesterday.  The patient today feels much improved.  Her pain is much better.  Hemoglobin is up to 7.2 after blood transfusion.  She has just finished another unit of blood.  Repeat hemoglobin has been ordered.  Principal Problem:   Retroperitoneal hematoma Active Problems:   Left renal mass   Pilar Plate hematuria   Acute renal failure (HCC)   Acute blood loss anemia  Current Facility-Administered Medications  Medication Dose Route Frequency Provider Last Rate Last Dose  . 0.9 %  sodium chloride infusion (Manually program via Guardrails IV Fluids)   Intravenous Once Hoffman, Jessica Ratliff, DO      . 0.9 %  sodium chloride infusion (Manually program via Guardrails IV Fluids)   Intravenous Once Hoffman, Jessica Ratliff, DO      . amLODipine (NORVASC) tablet 5 mg  5 mg Oral Daily Mosetta Anis, MD      . calcium carbonate (TUMS - dosed in mg elemental calcium) chewable tablet 200 mg of elemental calcium  1 tablet Oral Q4H PRN Kalman Shan Ratliff, DO   200 mg of elemental calcium at 09/09/18 1136  . feeding supplement (ENSURE ENLIVE) (ENSURE ENLIVE) liquid 237 mL  237 mL Oral BID BM Hoffman, Jessica Ratliff, DO   237 mL at 09/14/18 1048  . HYDROmorphone (DILAUDID) injection 1-2 mg  1-2 mg Intravenous Q2H PRN Alphonzo Grieve, MD   2 mg at 09/15/18 0244  . Influenza vac split quadrivalent PF (FLUARIX) injection 0.5 mL  0.5 mL Intramuscular Prior to discharge Hoffman, Jessica Ratliff, DO      . iodixanol (VISIPAQUE) 320 MG/ML injection 34 mL  34 mL Intravenous Once PRN Hoss, Arthur, MD      . lactated ringers bolus 250 mL  250 mL Intravenous Once Rehman, Areeg N, DO      . multivitamin with minerals tablet 1 tablet  1 tablet  Oral Daily Kalman Shan Ratliff, DO   1 tablet at 09/14/18 2141  . ondansetron (ZOFRAN) injection 4 mg  4 mg Intravenous Q6H PRN Kalman Shan Ratliff, DO   4 mg at 09/06/18 1444  . senna-docusate (Senokot-S) tablet 1 tablet  1 tablet Oral QHS PRN Kalman Shan Ratliff, DO      . simethicone (MYLICON) chewable tablet 80 mg  80 mg Oral Q6H PRN Kalman Shan Ratliff, DO   80 mg at 09/15/18 0252  . sodium chloride flush (NS) 0.9 % injection 10-40 mL  10-40 mL Intracatheter PRN Kalman Shan Ratliff, DO   10 mL at 09/13/18 2028     Objective: Vital: Vitals:   09/15/18 0315 09/15/18 0324 09/15/18 0527 09/15/18 0543  BP: (!) 158/97 (!) 163/93  (!) 165/95  Pulse: (!) 111 (!) 107  (!) 101  Resp: 17 16  16   Temp: 98 F (36.7 C) 98.1 F (36.7 C)  98.1 F (36.7 C)  TempSrc: Oral Oral  Oral  SpO2: 93% 93% 95% 94%  Weight:    63.4 kg  Height:       I/Os: I/O last 3 completed shifts: In: 2519.8 [P.O.:1410; I.V.:447.3; Blood:662.5] Out: 1775 [Urine:1775]  Physical Exam:  General: Patient is in no apparent distress Lungs: Normal respiratory effort, chest expands symmetrically. GI: abdomen soft especially on the right.  Mass effect on  the left with a palpable mass.  Not significantly tender today. Ext: lower extremities symmetric  Lab Results: Recent Labs    09/13/18 0329  09/14/18 0305 09/14/18 0945 09/14/18 2315  WBC 20.3*  --  41.0* 36.2*  --   HGB 6.6*   < > 7.5* 6.6* 7.2*  HCT 20.5*   < > 22.2* 19.4* 21.1*   < > = values in this interval not displayed.   Recent Labs    09/13/18 0329 09/13/18 1604 09/14/18 0305  NA 134* 133* 130*  K 3.6 3.5 4.0  CL 102 102 99  CO2 23 22 21*  GLUCOSE 131* 165* 162*  BUN 59* 55* 54*  CREATININE 2.40* 2.03* 1.85*  CALCIUM 8.2* 7.9* 7.9*   Recent Labs    09/14/18 1513  INR 1.36   No results for input(s): LABURIN in the last 72 hours. Results for orders placed or performed during the hospital encounter of 09/05/18   Blood Culture (routine x 2)     Status: None   Collection Time: 09/05/18  9:44 AM  Result Value Ref Range Status   Specimen Description BLOOD WEAKLY REACTIVE LEFT  Final   Special Requests   Final    BOTTLES DRAWN AEROBIC ONLY Blood Culture results may not be optimal due to an inadequate volume of blood received in culture bottles   Culture   Final    NO GROWTH 5 DAYS Performed at [facility], [place] [address] [place], [place] [address]    Report Status 09/10/2018 FINAL  Final  Blood Culture (routine x 2)     Status: None   Collection Time: 09/05/18  9:49 AM  Result Value Ref Range Status   Specimen Description BLOOD LEFT HAND  Final   Special Requests   Final    BOTTLES DRAWN AEROBIC ONLY Blood Culture results may not be optimal due to an inadequate volume of blood received in culture bottles   Culture   Final    NO GROWTH 5 DAYS Performed at [facility], [place] [address] [place], [place] [address]    Report Status 09/10/2018 FINAL  Final  Urine C&S     Status: None   Collection Time: 09/05/18  1:22 PM  Result Value Ref Range Status   Specimen Description URINE, RANDOM  Final   Special Requests NONE  Final   Culture   Final    NO GROWTH Performed at [facility], [place] [address] [place], [place] [address]    Report Status 09/06/2018 FINAL  Final  MRSA PCR Screening     Status: None   Collection Time: 09/14/18  7:50 PM  Result Value Ref Range Status   MRSA by PCR NEGATIVE NEGATIVE Final    Comment:        The GeneXpert MRSA Assay (FDA approved for NASAL specimens only), is one component of a comprehensive MRSA colonization surveillance program. It is not intended to diagnose MRSA infection nor to guide or monitor treatment for MRSA infections. Performed at [facility], [place] [address] [place], [place] [address]     Studies/Results: No results found.  Assessment: Left renal mass with retroperitoneal hematoma Acute renal  insufficiency  Plan: Patient appears improved today.  Currently scheduled for open nephrectomy next Tuesday.  I discussed this with her in detail.  I discussed that this will be an open surgery.  I discussed potential risks including but not limited to bleeding, infection, injury to surrounding  structures, need for additional procedures, possibility of permanent disability and death.  She understands the risks and desires to proceed.  We'll plan on scheduled date unless becomes hemodynamically unstable or the need arises to perform this earlier.   Link Snuffer, MD Urology 09/15/2018, 8:06 AM

## 2018-09-15 NOTE — Progress Notes (Signed)
   Subjective: [name] reported feeling the same this morning. Appeared improved since yesterday. Overnight she was tachycardic and tachypneic. The night team assessed her and discontinued her fluids, increased her amlodipine to 5 mg and gave her one dose of lasix 40 mg IV.   Objective:  Vital signs in last 24 hours: Vitals:   09/15/18 0315 09/15/18 0324 09/15/18 0527 09/15/18 0543  BP: (!) 158/97 (!) 163/93  (!) 165/95  Pulse: (!) 111 (!) 107  (!) 101  Resp: 17 16  16   Temp: 98 F (36.7 C) 98.1 F (36.7 C)  98.1 F (36.7 C)  TempSrc: Oral Oral  Oral  SpO2: 93% 93% 95% 94%  Weight:    63.4 kg  Height:       General- seen lying in bed, uncomfortable, NAD Heart- RRR, no murmrus Lungs- CTA bilaterally Abdomen- distended, bowel sounds present, LUQ and LLQ tenderness Extremities-no edema   Assessment/Plan:  Principal Problem:   Retroperitoneal hematoma Active Problems:   Left renal mass   Pilar Plate hematuria   Acute renal failure (HCC)   Acute blood loss anemia  Ms. Horace is a 57 yo female with no PMHx who presented with acute onset left sided flank pain, abdominal distension and hematuria for three days. She was found to havea hemorrhagic mass arising from the left kidney, worrisome for RCC.  Acute blood loss anemia 2/2 retroperitoneal hematoma - patient is hypertensive, and mildly tachycardic; improved since yesterday; abdomen still distended on exam. Her hemoglobin is currently8.2 after receiving 1 unit PRBC late evening yesterday and one overnight. She has received a total of 7 units of PRBC since admission - IR attempted a renal angiogram and embolization yesterday with no visualization of the left renal artery - Urology has scheduled surgery for 10/15 but will intervene sooner if patient becomes hemodynamically unstable - transfuse as needed to keep hgb >7, shehas received4units of blood thus farthis admission - H&H q12h   Acute renal failure Patient's  creatinine is currently1.72,continuing to down trend. Etiology thought to be due to ATN. Appreciate nephrology recommendations. Will need to closely monitor urine output due to concern for postobstructive diuresis.Urine output for the past 24 hours has been ~0.5 L.  - received IV contrast yesterday for embolization; she was given fluids before and after to help buffer contrast injury; fluids were discontinued overnight due to HTN; will continue to monitor renal function -strict I's and O's - f/u am bmet  HTN Initially improved with diuresis.Possibly due to hemorrhagic compression of renal vasculatureand volume status.165/95 - discontinued fluids overnight and given one dose of lasix 40 mg IV due to HTN - continue to monitor  Dispo: Anticipated discharge in approximately 1 week for urological surgery.   Mike Craze, DO 09/15/2018, 6:52 AM Pager: [phone]

## 2018-09-15 NOTE — Progress Notes (Signed)
Second unit of blood transfusion completed at 0527. Pt remained Afebrile with no adverse reactions noted

## 2018-09-15 NOTE — Progress Notes (Signed)
Pt Hypertensive Systolic BP> 343H and Diastolic BP in low 686H. Provider notified with new orders:  1) Discontinue LR @ 100 cc/hr  2) Lasix 40 mg IV

## 2018-09-15 NOTE — Progress Notes (Signed)
Medicine attending: I examined this patient today together with resident physician Dr Harlow Ohms and I concur with her evaluation and management plan which we discussed together and which will be detailed in her progress note to follow. We greatly appreciate close attention from urology and nephrology and assistance yesterday from interventional radiology. As noted, unsuccessful attempt at renal artery embolization due to failure to visualize the renal artery.  Suspect compression of the vessels by massive perinephric hematoma.  Accelerated bleeding yesterday appears to have subsided.  Pulse rate down.  I believe she received 3 total units over the last 24-36 hours and we have been able to get her hemoglobin up to 8.2.  Abdomen remains tense and distended. Fortunately, renal function continues to improve with creatinine 1.7 despite IV contrast given with yesterday's procedure. Unexplained exaggerated leukocytosis.  This is higher than what I would usually expect as a reactive process to an underlying malignancy.  It may be a reaction to free hemoglobin in the intra-abdominal cavity with peritoneal irritation. No signs of active infection. We would like to get a noncontrast CT of the abdomen as a means of follow-up since we do not have a study since admission. Patient husband is present.  Management plan discussed in detail.  Continue supportive care pending definitive surgery currently planned for October 15 but subject to change if patient shows any signs of decompensation.

## 2018-09-15 NOTE — Progress Notes (Signed)
[facility] Progress Note   Subjective:      Transfused overnight, hemoglobin 8.2 this morning, continues to have abdominal pain especially on the left side  Serum creatinine improved to 1.7 this morning, potassium 4.0  0.5 L of urine output recorded yesterday, not sure if accurate  Tentative for surgery on 10/15, urology following  Blood pressures up  Objective Vitals:   09/15/18 0527 09/15/18 0543 09/15/18 0803 09/15/18 0812  BP:  (!) 165/95 (!) 178/103   Pulse:  (!) 101 (!) 108   Resp:  16 (!) 26   Temp:  98.1 F (36.7 C)  98.5 F (36.9 C)  TempSrc:  Oral Oral Oral  SpO2: 95% 94% 94%   Weight:  63.4 kg    Height:       Physical Exam General:  Lying quietly in bed flat Heart: RRR, no rub Lungs: normal WOB, clear Abdomen: soft, mod distended, +BS Extremities: trace edema diffusely - improved   Additional Objective Labs: Basic Metabolic Panel: Recent Labs  Lab 09/13/18 1604 09/14/18 0305 09/15/18 0829  NA 133* 130* 132*  K 3.5 4.0 4.0  CL 102 99 98  CO2 22 21* 23  GLUCOSE 165* 162* 99  BUN 55* 54* 42*  CREATININE 2.03* 1.85* 1.72*  CALCIUM 7.9* 7.9* 8.2*   Liver Function Tests: No results for input(s): AST, ALT, ALKPHOS, BILITOT, PROT, ALBUMIN in the last 168 hours. No results for input(s): LIPASE, AMYLASE in the last 168 hours. CBC: Recent Labs  Lab 09/11/18 0658 09/12/18 0433 09/13/18 0329  09/14/18 0305 09/14/18 0945 09/14/18 2315 09/15/18 0829  WBC 24.1* 20.8* 20.3*  --  41.0* 36.2*  --  47.2*  NEUTROABS 19.6*  --   --   --   --   --   --   --   HGB 8.3* 7.3* 6.6*   < > 7.5* 6.6* 7.2* 8.2*  HCT 25.5* 22.3* 20.5*   < > 22.2* 19.4* 21.1* 24.1*  MCV 85.9 85.8 88.0  --  87.4 86.6  --  87.0  PLT 357 312 275  --  233 248  --  217   < > = values in this interval not displayed.   Blood Culture    Component Value Date/Time   SDES URINE, RANDOM 09/05/2018 1322   SPECREQUEST NONE 09/05/2018 1322   CULT  09/05/2018 1322    NO  GROWTH Performed at [facility], [place] [address], [place], [place] [address]    REPTSTATUS 09/06/2018 FINAL 09/05/2018 1322    Cardiac Enzymes: No results for input(s): CKTOTAL, CKMB, CKMBINDEX, TROPONINI in the last 168 hours. CBG: No results for input(s): GLUCAP in the last 168 hours. Iron Studies: No results for input(s): IRON, TIBC, TRANSFERRIN, FERRITIN in the last 72 hours. @lablastinr3 @ Studies/Results: Ir Angiogram Renal Left Selective  Result Date: 09/15/2018 INDICATION: Left perinephric hematoma EXAM: RENAL ANGIOGRAM MEDICATIONS: None. The antibiotic was administered within 1 hour of the procedure ANESTHESIA/SEDATION: Moderate (conscious) sedation was employed during this procedure. A total of Versed 3 mg and Fentanyl 100 mcg was administered intravenously. Moderate Sedation Time: 43 minutes. The patient's level of consciousness and vital signs were monitored continuously by radiology nursing throughout the procedure under my direct supervision. CONTRAST:  50 cc Visipaque 320 and carbon dioxide FLUOROSCOPY TIME:  Fluoroscopy Time: 13 minutes 54 seconds (144 mGy). COMPLICATIONS: None immediate. PROCEDURE: Informed consent was obtained from the patient following explanation of the procedure, risks, benefits and alternatives. The patient understands, agrees  and consents for the procedure. All questions were addressed. A time out was performed prior to the initiation of the procedure. Maximal barrier sterile technique utilized including caps, mask, sterile gowns, sterile gloves, large sterile drape, hand hygiene, and Betadine prep. The right groin was prepped and draped in a sterile fashion. 1% lidocaine was utilized for local anesthesia. Under sonographic guidance, a micropuncture needle was inserted into the right common femoral artery and removed over a 018 wire. The common femoral artery was noted to be patent and sonographic documentation was obtained. The 018 wire was up sized to a  Bentson. A 5 French sheath was inserted. A 5 French pigtail catheter was inserted. It was advanced into the aorta. Carbon dioxide abdominal aortography was performed. The left renal artery could not be identified. There was filling of the celiac, SMA, and right renal artery. A iodinated injection in the aorta was performed and imaging was obtained. Once again, the left renal artery was not opacified Initially, the pigtail catheter was exchanged over a Bentson wire for a cobra 2 catheter. An attempt was made to select the left renal artery. This was then exchanged for a 5 [place] Sos Omni catheter. Several vessels were selected. The celiac axis was selected, the SMA was selected, the right renal artery was selected, and 2 left lumbar arteries were also leg did successively and contrast was injected with angiogram images. An exhaustive search for the left renal artery was performed. The left renal artery cannot be identified. FINDINGS: Carbon dioxide abdominal aortography delineates aortic anatomy. Selective injection of the right renal artery demonstrates patency of the main renal artery and branches. Injection of the celiac axis confirms patency of the hepatic and splenic arteries. Injection of the SMA demonstrates no tumor vascularity or contrast extravasation. Selective injection of 2 successive left lumbar arteries demonstrates nodal tumor vascularity or obvious supply to the left kidney. IMPRESSION: Exhaustive angiography was performed in the identified occasion of the left renal artery. The left renal artery cannot be identified. Injection of 2 left lumbar arteries, celiac, SMA, and the right renal artery was performed. The left renal artery is either occluded or under spasm. When feasible, CT angiogram of the abdomen is recommended. Electronically Signed   By: Marybelle Killings M.D.   On: 09/15/2018 08:36   Ir Angiogram Visceral Selective  Result Date: 09/15/2018 INDICATION: Left perinephric hematoma EXAM: RENAL  ANGIOGRAM MEDICATIONS: None. The antibiotic was administered within 1 hour of the procedure ANESTHESIA/SEDATION: Moderate (conscious) sedation was employed during this procedure. A total of Versed 3 mg and Fentanyl 100 mcg was administered intravenously. Moderate Sedation Time: 43 minutes. The patient's level of consciousness and vital signs were monitored continuously by radiology nursing throughout the procedure under my direct supervision. CONTRAST:  50 cc Visipaque 320 and carbon dioxide FLUOROSCOPY TIME:  Fluoroscopy Time: 13 minutes 54 seconds (144 mGy). COMPLICATIONS: None immediate. PROCEDURE: Informed consent was obtained from the patient following explanation of the procedure, risks, benefits and alternatives. The patient understands, agrees and consents for the procedure. All questions were addressed. A time out was performed prior to the initiation of the procedure. Maximal barrier sterile technique utilized including caps, mask, sterile gowns, sterile gloves, large sterile drape, hand hygiene, and Betadine prep. The right groin was prepped and draped in a sterile fashion. 1% lidocaine was utilized for local anesthesia. Under sonographic guidance, a micropuncture needle was inserted into the right common femoral artery and removed over a 018 wire. The common femoral artery was noted  to be patent and sonographic documentation was obtained. The 018 wire was up sized to a Bentson. A 5 French sheath was inserted. A 5 French pigtail catheter was inserted. It was advanced into the aorta. Carbon dioxide abdominal aortography was performed. The left renal artery could not be identified. There was filling of the celiac, SMA, and right renal artery. A iodinated injection in the aorta was performed and imaging was obtained. Once again, the left renal artery was not opacified Initially, the pigtail catheter was exchanged over a Bentson wire for a cobra 2 catheter. An attempt was made to select the left renal artery.  This was then exchanged for a 5 [place] Sos Omni catheter. Several vessels were selected. The celiac axis was selected, the SMA was selected, the right renal artery was selected, and 2 left lumbar arteries were also leg did successively and contrast was injected with angiogram images. An exhaustive search for the left renal artery was performed. The left renal artery cannot be identified. FINDINGS: Carbon dioxide abdominal aortography delineates aortic anatomy. Selective injection of the right renal artery demonstrates patency of the main renal artery and branches. Injection of the celiac axis confirms patency of the hepatic and splenic arteries. Injection of the SMA demonstrates no tumor vascularity or contrast extravasation. Selective injection of 2 successive left lumbar arteries demonstrates nodal tumor vascularity or obvious supply to the left kidney. IMPRESSION: Exhaustive angiography was performed in the identified occasion of the left renal artery. The left renal artery cannot be identified. Injection of 2 left lumbar arteries, celiac, SMA, and the right renal artery was performed. The left renal artery is either occluded or under spasm. When feasible, CT angiogram of the abdomen is recommended. Electronically Signed   By: Marybelle Killings M.D.   On: 09/15/2018 08:36   Ir Angiogram Visceral Selective  Result Date: 09/15/2018 INDICATION: Left perinephric hematoma EXAM: RENAL ANGIOGRAM MEDICATIONS: None. The antibiotic was administered within 1 hour of the procedure ANESTHESIA/SEDATION: Moderate (conscious) sedation was employed during this procedure. A total of Versed 3 mg and Fentanyl 100 mcg was administered intravenously. Moderate Sedation Time: 43 minutes. The patient's level of consciousness and vital signs were monitored continuously by radiology nursing throughout the procedure under my direct supervision. CONTRAST:  50 cc Visipaque 320 and carbon dioxide FLUOROSCOPY TIME:  Fluoroscopy Time: 13 minutes  54 seconds (144 mGy). COMPLICATIONS: None immediate. PROCEDURE: Informed consent was obtained from the patient following explanation of the procedure, risks, benefits and alternatives. The patient understands, agrees and consents for the procedure. All questions were addressed. A time out was performed prior to the initiation of the procedure. Maximal barrier sterile technique utilized including caps, mask, sterile gowns, sterile gloves, large sterile drape, hand hygiene, and Betadine prep. The right groin was prepped and draped in a sterile fashion. 1% lidocaine was utilized for local anesthesia. Under sonographic guidance, a micropuncture needle was inserted into the right common femoral artery and removed over a 018 wire. The common femoral artery was noted to be patent and sonographic documentation was obtained. The 018 wire was up sized to a Bentson. A 5 French sheath was inserted. A 5 French pigtail catheter was inserted. It was advanced into the aorta. Carbon dioxide abdominal aortography was performed. The left renal artery could not be identified. There was filling of the celiac, SMA, and right renal artery. A iodinated injection in the aorta was performed and imaging was obtained. Once again, the left renal artery was not opacified Initially, the pigtail  catheter was exchanged over a Bentson wire for a cobra 2 catheter. An attempt was made to select the left renal artery. This was then exchanged for a 5 [place] Sos Omni catheter. Several vessels were selected. The celiac axis was selected, the SMA was selected, the right renal artery was selected, and 2 left lumbar arteries were also leg did successively and contrast was injected with angiogram images. An exhaustive search for the left renal artery was performed. The left renal artery cannot be identified. FINDINGS: Carbon dioxide abdominal aortography delineates aortic anatomy. Selective injection of the right renal artery demonstrates patency of the main  renal artery and branches. Injection of the celiac axis confirms patency of the hepatic and splenic arteries. Injection of the SMA demonstrates no tumor vascularity or contrast extravasation. Selective injection of 2 successive left lumbar arteries demonstrates nodal tumor vascularity or obvious supply to the left kidney. IMPRESSION: Exhaustive angiography was performed in the identified occasion of the left renal artery. The left renal artery cannot be identified. Injection of 2 left lumbar arteries, celiac, SMA, and the right renal artery was performed. The left renal artery is either occluded or under spasm. When feasible, CT angiogram of the abdomen is recommended. Electronically Signed   By: Marybelle Killings M.D.   On: 09/15/2018 08:36   Ir Angio/spinal Left  Result Date: 09/15/2018 INDICATION: Left perinephric hematoma EXAM: RENAL ANGIOGRAM MEDICATIONS: None. The antibiotic was administered within 1 hour of the procedure ANESTHESIA/SEDATION: Moderate (conscious) sedation was employed during this procedure. A total of Versed 3 mg and Fentanyl 100 mcg was administered intravenously. Moderate Sedation Time: 43 minutes. The patient's level of consciousness and vital signs were monitored continuously by radiology nursing throughout the procedure under my direct supervision. CONTRAST:  50 cc Visipaque 320 and carbon dioxide FLUOROSCOPY TIME:  Fluoroscopy Time: 13 minutes 54 seconds (144 mGy). COMPLICATIONS: None immediate. PROCEDURE: Informed consent was obtained from the patient following explanation of the procedure, risks, benefits and alternatives. The patient understands, agrees and consents for the procedure. All questions were addressed. A time out was performed prior to the initiation of the procedure. Maximal barrier sterile technique utilized including caps, mask, sterile gowns, sterile gloves, large sterile drape, hand hygiene, and Betadine prep. The right groin was prepped and draped in a sterile  fashion. 1% lidocaine was utilized for local anesthesia. Under sonographic guidance, a micropuncture needle was inserted into the right common femoral artery and removed over a 018 wire. The common femoral artery was noted to be patent and sonographic documentation was obtained. The 018 wire was up sized to a Bentson. A 5 French sheath was inserted. A 5 French pigtail catheter was inserted. It was advanced into the aorta. Carbon dioxide abdominal aortography was performed. The left renal artery could not be identified. There was filling of the celiac, SMA, and right renal artery. A iodinated injection in the aorta was performed and imaging was obtained. Once again, the left renal artery was not opacified Initially, the pigtail catheter was exchanged over a Bentson wire for a cobra 2 catheter. An attempt was made to select the left renal artery. This was then exchanged for a 5 [place] Sos Omni catheter. Several vessels were selected. The celiac axis was selected, the SMA was selected, the right renal artery was selected, and 2 left lumbar arteries were also leg did successively and contrast was injected with angiogram images. An exhaustive search for the left renal artery was performed. The left renal artery cannot be identified.  FINDINGS: Carbon dioxide abdominal aortography delineates aortic anatomy. Selective injection of the right renal artery demonstrates patency of the main renal artery and branches. Injection of the celiac axis confirms patency of the hepatic and splenic arteries. Injection of the SMA demonstrates no tumor vascularity or contrast extravasation. Selective injection of 2 successive left lumbar arteries demonstrates nodal tumor vascularity or obvious supply to the left kidney. IMPRESSION: Exhaustive angiography was performed in the identified occasion of the left renal artery. The left renal artery cannot be identified. Injection of 2 left lumbar arteries, celiac, SMA, and the right renal artery  was performed. The left renal artery is either occluded or under spasm. When feasible, CT angiogram of the abdomen is recommended. Electronically Signed   By: Marybelle Killings M.D.   On: 09/15/2018 08:36   Ir Angio/spinal Left  Result Date: 09/15/2018 INDICATION: Left perinephric hematoma EXAM: RENAL ANGIOGRAM MEDICATIONS: None. The antibiotic was administered within 1 hour of the procedure ANESTHESIA/SEDATION: Moderate (conscious) sedation was employed during this procedure. A total of Versed 3 mg and Fentanyl 100 mcg was administered intravenously. Moderate Sedation Time: 43 minutes. The patient's level of consciousness and vital signs were monitored continuously by radiology nursing throughout the procedure under my direct supervision. CONTRAST:  50 cc Visipaque 320 and carbon dioxide FLUOROSCOPY TIME:  Fluoroscopy Time: 13 minutes 54 seconds (144 mGy). COMPLICATIONS: None immediate. PROCEDURE: Informed consent was obtained from the patient following explanation of the procedure, risks, benefits and alternatives. The patient understands, agrees and consents for the procedure. All questions were addressed. A time out was performed prior to the initiation of the procedure. Maximal barrier sterile technique utilized including caps, mask, sterile gowns, sterile gloves, large sterile drape, hand hygiene, and Betadine prep. The right groin was prepped and draped in a sterile fashion. 1% lidocaine was utilized for local anesthesia. Under sonographic guidance, a micropuncture needle was inserted into the right common femoral artery and removed over a 018 wire. The common femoral artery was noted to be patent and sonographic documentation was obtained. The 018 wire was up sized to a Bentson. A 5 French sheath was inserted. A 5 French pigtail catheter was inserted. It was advanced into the aorta. Carbon dioxide abdominal aortography was performed. The left renal artery could not be identified. There was filling of the  celiac, SMA, and right renal artery. A iodinated injection in the aorta was performed and imaging was obtained. Once again, the left renal artery was not opacified Initially, the pigtail catheter was exchanged over a Bentson wire for a cobra 2 catheter. An attempt was made to select the left renal artery. This was then exchanged for a 5 [place] Sos Omni catheter. Several vessels were selected. The celiac axis was selected, the SMA was selected, the right renal artery was selected, and 2 left lumbar arteries were also leg did successively and contrast was injected with angiogram images. An exhaustive search for the left renal artery was performed. The left renal artery cannot be identified. FINDINGS: Carbon dioxide abdominal aortography delineates aortic anatomy. Selective injection of the right renal artery demonstrates patency of the main renal artery and branches. Injection of the celiac axis confirms patency of the hepatic and splenic arteries. Injection of the SMA demonstrates no tumor vascularity or contrast extravasation. Selective injection of 2 successive left lumbar arteries demonstrates nodal tumor vascularity or obvious supply to the left kidney. IMPRESSION: Exhaustive angiography was performed in the identified occasion of the left renal artery. The left renal artery cannot  be identified. Injection of 2 left lumbar arteries, celiac, SMA, and the right renal artery was performed. The left renal artery is either occluded or under spasm. When feasible, CT angiogram of the abdomen is recommended. Electronically Signed   By: Marybelle Killings M.D.   On: 09/15/2018 08:36   Ir US Guide Vasc Access Right  Result Date: 09/15/2018 INDICATION: Left perinephric hematoma EXAM: RENAL ANGIOGRAM MEDICATIONS: None. The antibiotic was administered within 1 hour of the procedure ANESTHESIA/SEDATION: Moderate (conscious) sedation was employed during this procedure. A total of Versed 3 mg and Fentanyl 100 mcg was administered  intravenously. Moderate Sedation Time: 43 minutes. The patient's level of consciousness and vital signs were monitored continuously by radiology nursing throughout the procedure under my direct supervision. CONTRAST:  50 cc Visipaque 320 and carbon dioxide FLUOROSCOPY TIME:  Fluoroscopy Time: 13 minutes 54 seconds (144 mGy). COMPLICATIONS: None immediate. PROCEDURE: Informed consent was obtained from the patient following explanation of the procedure, risks, benefits and alternatives. The patient understands, agrees and consents for the procedure. All questions were addressed. A time out was performed prior to the initiation of the procedure. Maximal barrier sterile technique utilized including caps, mask, sterile gowns, sterile gloves, large sterile drape, hand hygiene, and Betadine prep. The right groin was prepped and draped in a sterile fashion. 1% lidocaine was utilized for local anesthesia. Under sonographic guidance, a micropuncture needle was inserted into the right common femoral artery and removed over a 018 wire. The common femoral artery was noted to be patent and sonographic documentation was obtained. The 018 wire was up sized to a Bentson. A 5 French sheath was inserted. A 5 French pigtail catheter was inserted. It was advanced into the aorta. Carbon dioxide abdominal aortography was performed. The left renal artery could not be identified. There was filling of the celiac, SMA, and right renal artery. A iodinated injection in the aorta was performed and imaging was obtained. Once again, the left renal artery was not opacified Initially, the pigtail catheter was exchanged over a Bentson wire for a cobra 2 catheter. An attempt was made to select the left renal artery. This was then exchanged for a 5 [place] Sos Omni catheter. Several vessels were selected. The celiac axis was selected, the SMA was selected, the right renal artery was selected, and 2 left lumbar arteries were also leg did successively and  contrast was injected with angiogram images. An exhaustive search for the left renal artery was performed. The left renal artery cannot be identified. FINDINGS: Carbon dioxide abdominal aortography delineates aortic anatomy. Selective injection of the right renal artery demonstrates patency of the main renal artery and branches. Injection of the celiac axis confirms patency of the hepatic and splenic arteries. Injection of the SMA demonstrates no tumor vascularity or contrast extravasation. Selective injection of 2 successive left lumbar arteries demonstrates nodal tumor vascularity or obvious supply to the left kidney. IMPRESSION: Exhaustive angiography was performed in the identified occasion of the left renal artery. The left renal artery cannot be identified. Injection of 2 left lumbar arteries, celiac, SMA, and the right renal artery was performed. The left renal artery is either occluded or under spasm. When feasible, CT angiogram of the abdomen is recommended. Electronically Signed   By: Marybelle Killings M.D.   On: 09/15/2018 08:36   Medications: . lactated ringers 250 mL (09/15/18 1102)   . sodium chloride   Intravenous Once  . sodium chloride   Intravenous Once  . feeding supplement (ENSURE ENLIVE)  237 mL Oral BID BM  . multivitamin with minerals  1 tablet Oral Daily   Assessment/Plan: 1.  AKI:  Resolving; baseline normal GFR 09/2017. Suspected 2/2 ATN  from her acute blood loss anemia and potential compromise of left kidney nephron mass as unable to identify left renal artery at time of angiogram at 10/70; is at risk for contrast nephropathy but not evident at the current time.    2.  Hemorrhagic renal mass: urology following; plan for surgery 10/15 3.  Mild Hyponatremia:  Stable, mild.   4.  Anemia: acute blood loss anemia.  Transfusion per primary. No ESA indicated  5.  HTN: no history of HTN but has been fairly hypertensive here -likely driven by pain and potential renin secretion by  left kidney; does not need RAAS inhibitor at the current time  Sharon Henson 09/15/2018, 11:14 AM  [employer]

## 2018-09-16 LAB — BASIC METABOLIC PANEL
ANION GAP: 9 (ref 5–15)
BUN: 42 mg/dL — ABNORMAL HIGH (ref 6–20)
CHLORIDE: 90 mmol/L — AB (ref 98–111)
CO2: 25 mmol/L (ref 22–32)
Calcium: 7.8 mg/dL — ABNORMAL LOW (ref 8.9–10.3)
Creatinine, Ser: 1.56 mg/dL — ABNORMAL HIGH (ref 0.44–1.00)
GFR calc non Af Amer: 35 mL/min — ABNORMAL LOW (ref >60)
GFR, EST AFRICAN AMERICAN: 41 mL/min — AB (ref >60)
Glucose, Bld: 133 mg/dL — ABNORMAL HIGH (ref 70–99)
Potassium: 4.1 mmol/L (ref 3.5–5.1)
Sodium: 124 mmol/L — ABNORMAL LOW (ref 135–145)

## 2018-09-16 LAB — BPAM RBC
BLOOD PRODUCT EXPIRATION DATE: 201910132359
BLOOD PRODUCT EXPIRATION DATE: 201910252359
BLOOD PRODUCT EXPIRATION DATE: 201910302359
Blood Product Expiration Date: 201910112359
ISSUE DATE / TIME: 201910061020
ISSUE DATE / TIME: 201910062051
ISSUE DATE / TIME: 201910071651
ISSUE DATE / TIME: 201910080258
UNIT TYPE AND RH: 6200
UNIT TYPE AND RH: 6200
Unit Type and Rh: 6200
Unit Type and Rh: 6200

## 2018-09-16 LAB — TYPE AND SCREEN
ABO/RH(D): A POS
ANTIBODY SCREEN: NEGATIVE
UNIT DIVISION: 0
Unit division: 0
Unit division: 0
Unit division: 0

## 2018-09-16 LAB — CBC
HCT: 25 % — ABNORMAL LOW (ref 36.0–46.0)
Hemoglobin: 8.3 g/dL — ABNORMAL LOW (ref 12.0–15.0)
MCH: 29.4 pg (ref 26.0–34.0)
MCHC: 33.2 g/dL (ref 30.0–36.0)
MCV: 88.7 fL (ref 80.0–100.0)
PLATELETS: 329 10*3/uL (ref 150–400)
RBC: 2.82 MIL/uL — ABNORMAL LOW (ref 3.87–5.11)
RDW: 14.5 % (ref 11.5–15.5)
WBC: 38.8 10*3/uL — AB (ref 4.0–10.5)
nRBC: 0 % (ref 0.0–0.2)

## 2018-09-16 LAB — HEMOGLOBIN AND HEMATOCRIT, BLOOD
HEMATOCRIT: 23.6 % — AB (ref 36.0–46.0)
Hemoglobin: 7.8 g/dL — ABNORMAL LOW (ref 12.0–15.0)

## 2018-09-16 LAB — OSMOLALITY, URINE: Osmolality, Ur: 311 mOsm/kg (ref 300–900)

## 2018-09-16 LAB — OSMOLALITY: OSMOLALITY: 276 mosm/kg (ref 275–295)

## 2018-09-16 LAB — SODIUM, URINE, RANDOM

## 2018-09-16 NOTE — Progress Notes (Signed)
Urology Inpatient Progress Report  Dehydration [E86.0] Left renal mass [N28.89] Pilar Plate hematuria [R31.0] Sepsis, due to unspecified organism [A41.9] Acute renal failure, unspecified acute renal failure type ([place]) [N17.9]        Intv/Subj: No acute events overnight. Patient is without complaint.  Pain is stable.  Tachycardia has improved.  Hemoglobin is stable.  She had a CT without contrast performed of the abdomen and pelvis which revealed expansion of the hematoma which I would expect given her history.  The left kidney was actually well visualized on this exam likely secondary to recent angiogram.  Principal Problem:   Retroperitoneal hematoma Active Problems:   Left renal mass   Pilar Plate hematuria   Acute renal failure (HCC)   Acute blood loss anemia  Current Facility-Administered Medications  Medication Dose Route Frequency Provider Last Rate Last Dose  . 0.9 %  sodium chloride infusion (Manually program via Guardrails IV Fluids)   Intravenous Once Hoffman, Jessica Ratliff, DO      . 0.9 %  sodium chloride infusion (Manually program via Guardrails IV Fluids)   Intravenous Once Hoffman, Jessica Ratliff, DO      . 0.9 %  sodium chloride infusion   Intravenous PRN Annia Belt, MD 10 mL/hr at 09/15/18 2049 250 mL at 09/15/18 2049  . calcium carbonate (TUMS - dosed in mg elemental calcium) chewable tablet 200 mg of elemental calcium  1 tablet Oral Q4H PRN Kalman Shan Ratliff, DO   200 mg of elemental calcium at 09/09/18 1136  . feeding supplement (ENSURE ENLIVE) (ENSURE ENLIVE) liquid 237 mL  237 mL Oral BID BM Hoffman, Jessica Ratliff, DO   237 mL at 09/15/18 1344  . HYDROmorphone (DILAUDID) injection 1-2 mg  1-2 mg Intravenous Q2H PRN Alphonzo Grieve, MD   2 mg at 09/16/18 0524  . Influenza vac split quadrivalent PF (FLUARIX) injection 0.5 mL  0.5 mL Intramuscular Prior to discharge Hoffman, Jessica Ratliff, DO      . iodixanol (VISIPAQUE) 320 MG/ML injection 34 mL  34  mL Intravenous Once PRN Hoss, Arthur, MD      . multivitamin with minerals tablet 1 tablet  1 tablet Oral Daily Kalman Shan Ratliff, DO   1 tablet at 09/15/18 1010  . ondansetron (ZOFRAN) injection 4 mg  4 mg Intravenous Q6H PRN Kalman Shan Ratliff, DO   4 mg at 09/06/18 1444  . senna-docusate (Senokot-S) tablet 1 tablet  1 tablet Oral QHS PRN Kalman Shan Ratliff, DO      . simethicone (MYLICON) chewable tablet 80 mg  80 mg Oral Q6H PRN Kalman Shan Ratliff, DO   80 mg at 09/16/18 0310  . sodium chloride flush (NS) 0.9 % injection 10-40 mL  10-40 mL Intracatheter PRN Kalman Shan Ratliff, DO   10 mL at 09/13/18 2028     Objective: Vital: Vitals:   09/15/18 1617 09/15/18 2055 09/16/18 0018 09/16/18 0417  BP: (!) 154/90 (!) 151/92 (!) 157/102 (!) 158/101  Pulse: (!) 102 (!) 103 93 (!) 102  Resp: 19 14 11 18   Temp: 98.3 F (36.8 C) 97.6 F (36.4 C) 97.6 F (36.4 C) 98.4 F (36.9 C)  TempSrc: Oral Oral Oral Oral  SpO2: 92% 96% 100% 99%  Weight:      Height:       I/Os: I/O last 3 completed shifts: In: 1221.3 [P.O.:480; I.V.:447.3; Blood:294] Out: 325 [Urine:325]  Physical Exam:  General: Patient is in no apparent distress Lungs: Normal respiratory effort, chest expands symmetrically. GI:  Abdomen is distended secondary to mass-effect.  There is palpable mass in the left side of the abdomen.  Otherwise soft.  No peritoneal signs. Ext: lower extremities symmetric  Lab Results: Recent Labs    09/14/18 0305 09/14/18 0945 09/14/18 2315 09/15/18 0829 09/15/18 2120  WBC 41.0* 36.2*  --  47.2*  --   HGB 7.5* 6.6* 7.2* 8.2* 8.3*  HCT 22.2* 19.4* 21.1* 24.1* 24.3*   Recent Labs    09/14/18 0305 09/15/18 0829 09/16/18 0404  NA 130* 132* 124*  K 4.0 4.0 4.1  CL 99 98 90*  CO2 21* 23 25  GLUCOSE 162* 99 133*  BUN 54* 42* 42*  CREATININE 1.85* 1.72* 1.56*  CALCIUM 7.9* 8.2* 7.8*   Recent Labs    09/14/18 1513  INR 1.36   No results for input(s):  LABURIN in the last 72 hours. Results for orders placed or performed during the hospital encounter of 09/05/18  Blood Culture (routine x 2)     Status: None   Collection Time: 09/05/18  9:44 AM  Result Value Ref Range Status   Specimen Description BLOOD WEAKLY REACTIVE LEFT  Final   Special Requests   Final    BOTTLES DRAWN AEROBIC ONLY Blood Culture results may not be optimal due to an inadequate volume of blood received in culture bottles   Culture   Final    NO GROWTH 5 DAYS Performed at [facility], [place] [address] [place], [place] [address]    Report Status 09/10/2018 FINAL  Final  Blood Culture (routine x 2)     Status: None   Collection Time: 09/05/18  9:49 AM  Result Value Ref Range Status   Specimen Description BLOOD LEFT HAND  Final   Special Requests   Final    BOTTLES DRAWN AEROBIC ONLY Blood Culture results may not be optimal due to an inadequate volume of blood received in culture bottles   Culture   Final    NO GROWTH 5 DAYS Performed at [facility], [place] [address] [place], [place] [address]    Report Status 09/10/2018 FINAL  Final  Urine C&S     Status: None   Collection Time: 09/05/18  1:22 PM  Result Value Ref Range Status   Specimen Description URINE, RANDOM  Final   Special Requests NONE  Final   Culture   Final    NO GROWTH Performed at [facility], [place] [address] [place], [place] [address]    Report Status 09/06/2018 FINAL  Final  MRSA PCR Screening     Status: None   Collection Time: 09/14/18  7:50 PM  Result Value Ref Range Status   MRSA by PCR NEGATIVE NEGATIVE Final    Comment:        The GeneXpert MRSA Assay (FDA approved for NASAL specimens only), is one component of a comprehensive MRSA colonization surveillance program. It is not intended to diagnose MRSA infection nor to guide or monitor treatment for MRSA infections. Performed at [facility], [place] [address], [place], [place] [address]      Studies/Results: Ct Abdomen Pelvis Wo Contrast  Result Date: 09/15/2018 CLINICAL DATA:  Hemorrhagic renal cyst reassessment EXAM: CT ABDOMEN AND PELVIS WITHOUT CONTRAST TECHNIQUE: Multidetector CT imaging of the abdomen and pelvis was performed following the standard protocol without IV contrast. COMPARISON:  CT abdomen 09/05/2018 FINDINGS: Lower chest: Large left pleural effusion, considerably increased from 09/05/2018. Associated passive atelectasis. Bilateral breast implants.  Mild atelectasis in the right lower lobe. Hepatobiliary: Mild dependent density in the gallbladder potentially from sludge or gallstones. Pancreas: Displacement of the pancreatic tail due to the large hemorrhagic lesion of the left kidney. Spleen: Unremarkable Adrenals/Urinary Tract: A complex/hemorrhagic left kidney upper pole mass measures 17.6 by 15.3 by 19.1 cm (volume = 2690 cm^3) and contains internal complexity and heterogeneity along with some calcifications. There may be a tiny locule of gas density along the upper margin of this lesion on image 25/3. By my measurements, this lesion was previously 16.6 by 12.4 by 17.5 cm (volume = 1890 cm^3), and accordingly has increased in volume by 42% over the last 10 days. There is some associated flattening of the left kidney as well as a small amount of additional blood products in the left perirenal space. The kidney is also displaced inferiorly and anteriorly by this process. There high density in the urinary bladder which could be from blood products or previously excreted contrast medium. There is contrast medium stasis in both kidneys likely left over from yesterday's angiogram Stomach/Bowel: Displaced bowel due to the left renal mass effect. No dilated bowel observed. Vascular/Lymphatic: Aortoiliac atherosclerotic vascular disease. Reproductive: Unremarkable Other: Small but abnormal amount of pelvic ascites. Subcutaneous edema along the flanks and pubis. Musculoskeletal:  Unremarkable IMPRESSION: 1. Enlarging mass lesion associated with the left kidney, currently 2690 cubic cm, increased by 42% in volume over the last 10 days. This probably represents enlarging hematoma. Additional blood products are present in the perirenal space and the kidney is displaced inferiorly and anteriorly by this process. An underlying tumor is not excluded. 2. There is abnormal high density in both kidneys in a corticomedullary distribution. No contrast was administered during today's exam and this likely represents stasis of contrast from yesterday's angiogram, indicating renal dysfunction. High density in the urinary bladder may reflect a small amount of excreted contrast medium. 3. Large left pleural effusion, increased from 09/05/2018, with associated passive atelectasis. 4. Small amount of pelvic ascites, nonspecific. There is also subcutaneous edema along the flanks and pubis. 5. Mild dependent density in the gallbladder potentially from sludge or gallstones. Electronically Signed   By: Van Clines M.D.   On: 09/15/2018 23:57   Ir Angiogram Renal Left Selective  Result Date: 09/15/2018 INDICATION: Left perinephric hematoma EXAM: RENAL ANGIOGRAM MEDICATIONS: None. The antibiotic was administered within 1 hour of the procedure ANESTHESIA/SEDATION: Moderate (conscious) sedation was employed during this procedure. A total of Versed 3 mg and Fentanyl 100 mcg was administered intravenously. Moderate Sedation Time: 43 minutes. The patient's level of consciousness and vital signs were monitored continuously by radiology nursing throughout the procedure under my direct supervision. CONTRAST:  50 cc Visipaque 320 and carbon dioxide FLUOROSCOPY TIME:  Fluoroscopy Time: 13 minutes 54 seconds (144 mGy). COMPLICATIONS: None immediate. PROCEDURE: Informed consent was obtained from the patient following explanation of the procedure, risks, benefits and alternatives. The patient understands, agrees and  consents for the procedure. All questions were addressed. A time out was performed prior to the initiation of the procedure. Maximal barrier sterile technique utilized including caps, mask, sterile gowns, sterile gloves, large sterile drape, hand hygiene, and Betadine prep. The right groin was prepped and draped in a sterile fashion. 1% lidocaine was utilized for local anesthesia. Under sonographic guidance, a micropuncture needle was inserted into the right common femoral artery and removed over a 018 wire. The common femoral artery was noted to be patent and sonographic documentation was obtained. The 018 wire was  up sized to a Bentson. A 5 French sheath was inserted. A 5 French pigtail catheter was inserted. It was advanced into the aorta. Carbon dioxide abdominal aortography was performed. The left renal artery could not be identified. There was filling of the celiac, SMA, and right renal artery. A iodinated injection in the aorta was performed and imaging was obtained. Once again, the left renal artery was not opacified Initially, the pigtail catheter was exchanged over a Bentson wire for a cobra 2 catheter. An attempt was made to select the left renal artery. This was then exchanged for a 5 [place] Sos Omni catheter. Several vessels were selected. The celiac axis was selected, the SMA was selected, the right renal artery was selected, and 2 left lumbar arteries were also leg did successively and contrast was injected with angiogram images. An exhaustive search for the left renal artery was performed. The left renal artery cannot be identified. FINDINGS: Carbon dioxide abdominal aortography delineates aortic anatomy. Selective injection of the right renal artery demonstrates patency of the main renal artery and branches. Injection of the celiac axis confirms patency of the hepatic and splenic arteries. Injection of the SMA demonstrates no tumor vascularity or contrast extravasation. Selective injection of 2  successive left lumbar arteries demonstrates nodal tumor vascularity or obvious supply to the left kidney. IMPRESSION: Exhaustive angiography was performed in the identified occasion of the left renal artery. The left renal artery cannot be identified. Injection of 2 left lumbar arteries, celiac, SMA, and the right renal artery was performed. The left renal artery is either occluded or under spasm. When feasible, CT angiogram of the abdomen is recommended. Electronically Signed   By: Marybelle Killings M.D.   On: 09/15/2018 08:36   Ir Angiogram Visceral Selective  Result Date: 09/15/2018 INDICATION: Left perinephric hematoma EXAM: RENAL ANGIOGRAM MEDICATIONS: None. The antibiotic was administered within 1 hour of the procedure ANESTHESIA/SEDATION: Moderate (conscious) sedation was employed during this procedure. A total of Versed 3 mg and Fentanyl 100 mcg was administered intravenously. Moderate Sedation Time: 43 minutes. The patient's level of consciousness and vital signs were monitored continuously by radiology nursing throughout the procedure under my direct supervision. CONTRAST:  50 cc Visipaque 320 and carbon dioxide FLUOROSCOPY TIME:  Fluoroscopy Time: 13 minutes 54 seconds (144 mGy). COMPLICATIONS: None immediate. PROCEDURE: Informed consent was obtained from the patient following explanation of the procedure, risks, benefits and alternatives. The patient understands, agrees and consents for the procedure. All questions were addressed. A time out was performed prior to the initiation of the procedure. Maximal barrier sterile technique utilized including caps, mask, sterile gowns, sterile gloves, large sterile drape, hand hygiene, and Betadine prep. The right groin was prepped and draped in a sterile fashion. 1% lidocaine was utilized for local anesthesia. Under sonographic guidance, a micropuncture needle was inserted into the right common femoral artery and removed over a 018 wire. The common femoral artery  was noted to be patent and sonographic documentation was obtained. The 018 wire was up sized to a Bentson. A 5 French sheath was inserted. A 5 French pigtail catheter was inserted. It was advanced into the aorta. Carbon dioxide abdominal aortography was performed. The left renal artery could not be identified. There was filling of the celiac, SMA, and right renal artery. A iodinated injection in the aorta was performed and imaging was obtained. Once again, the left renal artery was not opacified Initially, the pigtail catheter was exchanged over a Bentson wire for a cobra 2 catheter.  An attempt was made to select the left renal artery. This was then exchanged for a 5 [place] Sos Omni catheter. Several vessels were selected. The celiac axis was selected, the SMA was selected, the right renal artery was selected, and 2 left lumbar arteries were also leg did successively and contrast was injected with angiogram images. An exhaustive search for the left renal artery was performed. The left renal artery cannot be identified. FINDINGS: Carbon dioxide abdominal aortography delineates aortic anatomy. Selective injection of the right renal artery demonstrates patency of the main renal artery and branches. Injection of the celiac axis confirms patency of the hepatic and splenic arteries. Injection of the SMA demonstrates no tumor vascularity or contrast extravasation. Selective injection of 2 successive left lumbar arteries demonstrates nodal tumor vascularity or obvious supply to the left kidney. IMPRESSION: Exhaustive angiography was performed in the identified occasion of the left renal artery. The left renal artery cannot be identified. Injection of 2 left lumbar arteries, celiac, SMA, and the right renal artery was performed. The left renal artery is either occluded or under spasm. When feasible, CT angiogram of the abdomen is recommended. Electronically Signed   By: Marybelle Killings M.D.   On: 09/15/2018 08:36   Ir  Angiogram Visceral Selective  Result Date: 09/15/2018 INDICATION: Left perinephric hematoma EXAM: RENAL ANGIOGRAM MEDICATIONS: None. The antibiotic was administered within 1 hour of the procedure ANESTHESIA/SEDATION: Moderate (conscious) sedation was employed during this procedure. A total of Versed 3 mg and Fentanyl 100 mcg was administered intravenously. Moderate Sedation Time: 43 minutes. The patient's level of consciousness and vital signs were monitored continuously by radiology nursing throughout the procedure under my direct supervision. CONTRAST:  50 cc Visipaque 320 and carbon dioxide FLUOROSCOPY TIME:  Fluoroscopy Time: 13 minutes 54 seconds (144 mGy). COMPLICATIONS: None immediate. PROCEDURE: Informed consent was obtained from the patient following explanation of the procedure, risks, benefits and alternatives. The patient understands, agrees and consents for the procedure. All questions were addressed. A time out was performed prior to the initiation of the procedure. Maximal barrier sterile technique utilized including caps, mask, sterile gowns, sterile gloves, large sterile drape, hand hygiene, and Betadine prep. The right groin was prepped and draped in a sterile fashion. 1% lidocaine was utilized for local anesthesia. Under sonographic guidance, a micropuncture needle was inserted into the right common femoral artery and removed over a 018 wire. The common femoral artery was noted to be patent and sonographic documentation was obtained. The 018 wire was up sized to a Bentson. A 5 French sheath was inserted. A 5 French pigtail catheter was inserted. It was advanced into the aorta. Carbon dioxide abdominal aortography was performed. The left renal artery could not be identified. There was filling of the celiac, SMA, and right renal artery. A iodinated injection in the aorta was performed and imaging was obtained. Once again, the left renal artery was not opacified Initially, the pigtail catheter was  exchanged over a Bentson wire for a cobra 2 catheter. An attempt was made to select the left renal artery. This was then exchanged for a 5 [place] Sos Omni catheter. Several vessels were selected. The celiac axis was selected, the SMA was selected, the right renal artery was selected, and 2 left lumbar arteries were also leg did successively and contrast was injected with angiogram images. An exhaustive search for the left renal artery was performed. The left renal artery cannot be identified. FINDINGS: Carbon dioxide abdominal aortography delineates aortic anatomy. Selective injection of  the right renal artery demonstrates patency of the main renal artery and branches. Injection of the celiac axis confirms patency of the hepatic and splenic arteries. Injection of the SMA demonstrates no tumor vascularity or contrast extravasation. Selective injection of 2 successive left lumbar arteries demonstrates nodal tumor vascularity or obvious supply to the left kidney. IMPRESSION: Exhaustive angiography was performed in the identified occasion of the left renal artery. The left renal artery cannot be identified. Injection of 2 left lumbar arteries, celiac, SMA, and the right renal artery was performed. The left renal artery is either occluded or under spasm. When feasible, CT angiogram of the abdomen is recommended. Electronically Signed   By: Marybelle Killings M.D.   On: 09/15/2018 08:36   Ir Angio/spinal Left  Result Date: 09/15/2018 INDICATION: Left perinephric hematoma EXAM: RENAL ANGIOGRAM MEDICATIONS: None. The antibiotic was administered within 1 hour of the procedure ANESTHESIA/SEDATION: Moderate (conscious) sedation was employed during this procedure. A total of Versed 3 mg and Fentanyl 100 mcg was administered intravenously. Moderate Sedation Time: 43 minutes. The patient's level of consciousness and vital signs were monitored continuously by radiology nursing throughout the procedure under my direct supervision.  CONTRAST:  50 cc Visipaque 320 and carbon dioxide FLUOROSCOPY TIME:  Fluoroscopy Time: 13 minutes 54 seconds (144 mGy). COMPLICATIONS: None immediate. PROCEDURE: Informed consent was obtained from the patient following explanation of the procedure, risks, benefits and alternatives. The patient understands, agrees and consents for the procedure. All questions were addressed. A time out was performed prior to the initiation of the procedure. Maximal barrier sterile technique utilized including caps, mask, sterile gowns, sterile gloves, large sterile drape, hand hygiene, and Betadine prep. The right groin was prepped and draped in a sterile fashion. 1% lidocaine was utilized for local anesthesia. Under sonographic guidance, a micropuncture needle was inserted into the right common femoral artery and removed over a 018 wire. The common femoral artery was noted to be patent and sonographic documentation was obtained. The 018 wire was up sized to a Bentson. A 5 French sheath was inserted. A 5 French pigtail catheter was inserted. It was advanced into the aorta. Carbon dioxide abdominal aortography was performed. The left renal artery could not be identified. There was filling of the celiac, SMA, and right renal artery. A iodinated injection in the aorta was performed and imaging was obtained. Once again, the left renal artery was not opacified Initially, the pigtail catheter was exchanged over a Bentson wire for a cobra 2 catheter. An attempt was made to select the left renal artery. This was then exchanged for a 5 [place] Sos Omni catheter. Several vessels were selected. The celiac axis was selected, the SMA was selected, the right renal artery was selected, and 2 left lumbar arteries were also leg did successively and contrast was injected with angiogram images. An exhaustive search for the left renal artery was performed. The left renal artery cannot be identified. FINDINGS: Carbon dioxide abdominal aortography  delineates aortic anatomy. Selective injection of the right renal artery demonstrates patency of the main renal artery and branches. Injection of the celiac axis confirms patency of the hepatic and splenic arteries. Injection of the SMA demonstrates no tumor vascularity or contrast extravasation. Selective injection of 2 successive left lumbar arteries demonstrates nodal tumor vascularity or obvious supply to the left kidney. IMPRESSION: Exhaustive angiography was performed in the identified occasion of the left renal artery. The left renal artery cannot be identified. Injection of 2 left lumbar arteries, celiac, SMA, and  the right renal artery was performed. The left renal artery is either occluded or under spasm. When feasible, CT angiogram of the abdomen is recommended. Electronically Signed   By: Marybelle Killings M.D.   On: 09/15/2018 08:36   Ir Angio/spinal Left  Result Date: 09/15/2018 INDICATION: Left perinephric hematoma EXAM: RENAL ANGIOGRAM MEDICATIONS: None. The antibiotic was administered within 1 hour of the procedure ANESTHESIA/SEDATION: Moderate (conscious) sedation was employed during this procedure. A total of Versed 3 mg and Fentanyl 100 mcg was administered intravenously. Moderate Sedation Time: 43 minutes. The patient's level of consciousness and vital signs were monitored continuously by radiology nursing throughout the procedure under my direct supervision. CONTRAST:  50 cc Visipaque 320 and carbon dioxide FLUOROSCOPY TIME:  Fluoroscopy Time: 13 minutes 54 seconds (144 mGy). COMPLICATIONS: None immediate. PROCEDURE: Informed consent was obtained from the patient following explanation of the procedure, risks, benefits and alternatives. The patient understands, agrees and consents for the procedure. All questions were addressed. A time out was performed prior to the initiation of the procedure. Maximal barrier sterile technique utilized including caps, mask, sterile gowns, sterile gloves, large  sterile drape, hand hygiene, and Betadine prep. The right groin was prepped and draped in a sterile fashion. 1% lidocaine was utilized for local anesthesia. Under sonographic guidance, a micropuncture needle was inserted into the right common femoral artery and removed over a 018 wire. The common femoral artery was noted to be patent and sonographic documentation was obtained. The 018 wire was up sized to a Bentson. A 5 French sheath was inserted. A 5 French pigtail catheter was inserted. It was advanced into the aorta. Carbon dioxide abdominal aortography was performed. The left renal artery could not be identified. There was filling of the celiac, SMA, and right renal artery. A iodinated injection in the aorta was performed and imaging was obtained. Once again, the left renal artery was not opacified Initially, the pigtail catheter was exchanged over a Bentson wire for a cobra 2 catheter. An attempt was made to select the left renal artery. This was then exchanged for a 5 [place] Sos Omni catheter. Several vessels were selected. The celiac axis was selected, the SMA was selected, the right renal artery was selected, and 2 left lumbar arteries were also leg did successively and contrast was injected with angiogram images. An exhaustive search for the left renal artery was performed. The left renal artery cannot be identified. FINDINGS: Carbon dioxide abdominal aortography delineates aortic anatomy. Selective injection of the right renal artery demonstrates patency of the main renal artery and branches. Injection of the celiac axis confirms patency of the hepatic and splenic arteries. Injection of the SMA demonstrates no tumor vascularity or contrast extravasation. Selective injection of 2 successive left lumbar arteries demonstrates nodal tumor vascularity or obvious supply to the left kidney. IMPRESSION: Exhaustive angiography was performed in the identified occasion of the left renal artery. The left renal artery  cannot be identified. Injection of 2 left lumbar arteries, celiac, SMA, and the right renal artery was performed. The left renal artery is either occluded or under spasm. When feasible, CT angiogram of the abdomen is recommended. Electronically Signed   By: Marybelle Killings M.D.   On: 09/15/2018 08:36   Ir US Guide Vasc Access Right  Result Date: 09/15/2018 INDICATION: Left perinephric hematoma EXAM: RENAL ANGIOGRAM MEDICATIONS: None. The antibiotic was administered within 1 hour of the procedure ANESTHESIA/SEDATION: Moderate (conscious) sedation was employed during this procedure. A total of Versed 3 mg and  Fentanyl 100 mcg was administered intravenously. Moderate Sedation Time: 43 minutes. The patient's level of consciousness and vital signs were monitored continuously by radiology nursing throughout the procedure under my direct supervision. CONTRAST:  50 cc Visipaque 320 and carbon dioxide FLUOROSCOPY TIME:  Fluoroscopy Time: 13 minutes 54 seconds (144 mGy). COMPLICATIONS: None immediate. PROCEDURE: Informed consent was obtained from the patient following explanation of the procedure, risks, benefits and alternatives. The patient understands, agrees and consents for the procedure. All questions were addressed. A time out was performed prior to the initiation of the procedure. Maximal barrier sterile technique utilized including caps, mask, sterile gowns, sterile gloves, large sterile drape, hand hygiene, and Betadine prep. The right groin was prepped and draped in a sterile fashion. 1% lidocaine was utilized for local anesthesia. Under sonographic guidance, a micropuncture needle was inserted into the right common femoral artery and removed over a 018 wire. The common femoral artery was noted to be patent and sonographic documentation was obtained. The 018 wire was up sized to a Bentson. A 5 French sheath was inserted. A 5 French pigtail catheter was inserted. It was advanced into the aorta. Carbon dioxide  abdominal aortography was performed. The left renal artery could not be identified. There was filling of the celiac, SMA, and right renal artery. A iodinated injection in the aorta was performed and imaging was obtained. Once again, the left renal artery was not opacified Initially, the pigtail catheter was exchanged over a Bentson wire for a cobra 2 catheter. An attempt was made to select the left renal artery. This was then exchanged for a 5 [place] Sos Omni catheter. Several vessels were selected. The celiac axis was selected, the SMA was selected, the right renal artery was selected, and 2 left lumbar arteries were also leg did successively and contrast was injected with angiogram images. An exhaustive search for the left renal artery was performed. The left renal artery cannot be identified. FINDINGS: Carbon dioxide abdominal aortography delineates aortic anatomy. Selective injection of the right renal artery demonstrates patency of the main renal artery and branches. Injection of the celiac axis confirms patency of the hepatic and splenic arteries. Injection of the SMA demonstrates no tumor vascularity or contrast extravasation. Selective injection of 2 successive left lumbar arteries demonstrates nodal tumor vascularity or obvious supply to the left kidney. IMPRESSION: Exhaustive angiography was performed in the identified occasion of the left renal artery. The left renal artery cannot be identified. Injection of 2 left lumbar arteries, celiac, SMA, and the right renal artery was performed. The left renal artery is either occluded or under spasm. When feasible, CT angiogram of the abdomen is recommended. Electronically Signed   By: Marybelle Killings M.D.   On: 09/15/2018 08:36    Assessment: Left retroperitoneal hematoma likely secondary to left renal mass Acute renal insufficiency--improving  Plan: Agree with current plan of management.  She does have expansion of the hematoma but her hemoglobin is stable  indicating she had an acute bleed that has hopefully resolved.  Continue as planned with likely nephrectomy on Tuesday.  I do have her on the schedule to discuss in our monthly tumor board on Friday as well.  Ideally, if we proceed with nephrectomy, I would like for her to have another blood transfusion with a goal hemoglobin of 10 given the possibility of getting into excess bleeding during the procedure.  Need to medically optimized.  Very much appreciate the assistance of the internal medicine service.   Link Snuffer, MD  Urology 09/16/2018, 7:28 AM

## 2018-09-16 NOTE — Progress Notes (Signed)
   Subjective: Sharon Henson reported feeling better today but felt more swollen. She said her abdomen feels tighter and her legs more swollen. She also noticed her legs feel numb at times, especially on the left.   Objective:  Vital signs in last 24 hours: Vitals:   09/15/18 1617 09/15/18 2055 09/16/18 0018 09/16/18 0417  BP: (!) 154/90 (!) 151/92 (!) 157/102 (!) 158/101  Pulse: (!) 102 (!) 103 93 (!) 102  Resp: 19 14 11 18   Temp: 98.3 F (36.8 C) 97.6 F (36.4 C) 97.6 F (36.4 C) 98.4 F (36.9 C)  TempSrc: Oral Oral Oral Oral  SpO2: 92% 96% 100% 99%  Weight:      Height:       General- seen lying in bed in NAD  Heart- RRR, no murmurs Lungs- mild basilar crackles bilaterally  Abdomen- distended more than yesterday, bowel sounds present  Extremities- non pitting edema bilaterally   Assessment/Plan:  Principal Problem:   Retroperitoneal hematoma Active Problems:   Left renal mass   Pilar Plate hematuria   Acute renal failure (HCC)   Acute blood loss anemia  Sharon Henson is a 57 yo female with no PMHx who presented with acute onset left sided flank pain, abdominal distension and hematuria for three days. She was found to havea hemorrhagic mass arising from the left kidney, worrisome for RCC.  Acute blood loss anemia 2/2 retroperitoneal hematoma IR attempted a renal angiogram and embolization on 10/7 with no visualization of the left renal artery. Urology has scheduled surgery for 10/15 but will intervene sooner if patient becomes hemodynamically unstable - patient ishypertensive,slightly tachycardic- improved today; abdomen more distended on exam - last night 8 pm H&H 8.3; she has received a total of 7 units of PRBC since admission - 8 am H & H 7.8, stable overnight  - non-contrast CT abdomen showed enlarging mass associated with left kidney, increased by 42% in volume; large left pleural effusion increased from 9/28 seen with associated passive atelectasis  - transfuse as needed  to keep hgb >7, shehas received4units of blood thus farthis admission - H&H q12h  - appreciate urology recommendations  Acute renal failure Patient's creatinine is currently1.56,continuing to down trend. Etiology thought to be due to ATN. Appreciate nephrology recommendations. Will need to closely monitor urine output due to concern for postobstructive diuresis.Urine output for the past 24 hours has been~0.3L.  - received IV contrast on 10/7 for embolization; she was given fluids before and after to help buffer contrast injury; fluids were discontinued overnight due to HTN; will continue to monitor renal function - increased lower extremity edema and abdominal distension  -strict I's and O's - f/u am bmet - appreciate nephrology recommendations  HTN Initially improved with diuresis.Possibly due to hemorrhagic compression of renal vasculatureand volume status.158/101 - continue to monitor  Dispo: Urology plans to do surgery 10/15 unless patient becomes hemodynamically unstable and they need to intervene sooner. Patient is not stable to go home in the meantime due to continuous need for blood transfusions, acute renal failure, HTN and risk for becoming hemodynamically unstable.  Mike Craze, DO 09/16/2018, 6:34 AM Pager: [phone]

## 2018-09-16 NOTE — Progress Notes (Signed)
[facility] Progress Note   Subjective:      No interval developments  SCr further improved 1.56 this AM  Incomplete UOP  SNa is 124 this AM from 132; has 4 different water cups on table  Drinking ad lib   No N/V, HA  Objective Vitals:   09/15/18 2055 09/16/18 0018 09/16/18 0417 09/16/18 0800  BP: (!) 151/92 (!) 157/102 (!) 158/101   Pulse: (!) 103 93 (!) 102 96  Resp: 14 11 18 18   Temp: 97.6 F (36.4 C) 97.6 F (36.4 C) 98.4 F (36.9 C)   TempSrc: Oral Oral Oral   SpO2: 96% 100% 99% 100%  Weight:      Height:       Physical Exam General:  Lying quietly in bed flat Heart: RRR, no rub Lungs: normal WOB, clear Abdomen: soft, mod distended, +BS Extremities: 1-2+edema diffusely - Nonfocal, AAO x3   Additional Objective Labs: Basic Metabolic Panel: Recent Labs  Lab 09/14/18 0305 09/15/18 0829 09/16/18 0404  NA 130* 132* 124*  K 4.0 4.0 4.1  CL 99 98 90*  CO2 21* 23 25  GLUCOSE 162* 99 133*  BUN 54* 42* 42*  CREATININE 1.85* 1.72* 1.56*  CALCIUM 7.9* 8.2* 7.8*   Liver Function Tests: No results for input(s): AST, ALT, ALKPHOS, BILITOT, PROT, ALBUMIN in the last 168 hours. No results for input(s): LIPASE, AMYLASE in the last 168 hours. CBC: Recent Labs  Lab 09/11/18 0658 09/12/18 0433 09/13/18 0329  09/14/18 0305 09/14/18 0945  09/15/18 0829 09/15/18 2120 09/16/18 0800  WBC 24.1* 20.8* 20.3*  --  41.0* 36.2*  --  47.2*  --   --   NEUTROABS 19.6*  --   --   --   --   --   --   --   --   --   HGB 8.3* 7.3* 6.6*   < > 7.5* 6.6*   < > 8.2* 8.3* 7.8*  HCT 25.5* 22.3* 20.5*   < > 22.2* 19.4*   < > 24.1* 24.3* 23.6*  MCV 85.9 85.8 88.0  --  87.4 86.6  --  87.0  --   --   PLT 357 312 275  --  233 248  --  217  --   --    < > = values in this interval not displayed.   Blood Culture    Component Value Date/Time   SDES URINE, RANDOM 09/05/2018 1322   SPECREQUEST NONE 09/05/2018 1322   CULT  09/05/2018 1322    NO GROWTH Performed  at [facility], [place] [address], [place], [place] [address]    REPTSTATUS 09/06/2018 FINAL 09/05/2018 1322    Cardiac Enzymes: No results for input(s): CKTOTAL, CKMB, CKMBINDEX, TROPONINI in the last 168 hours. CBG: No results for input(s): GLUCAP in the last 168 hours. Iron Studies: No results for input(s): IRON, TIBC, TRANSFERRIN, FERRITIN in the last 72 hours. @lablastinr3 @ Studies/Results: Ct Abdomen Pelvis Wo Contrast  Result Date: 09/15/2018 CLINICAL DATA:  Hemorrhagic renal cyst reassessment EXAM: CT ABDOMEN AND PELVIS WITHOUT CONTRAST TECHNIQUE: Multidetector CT imaging of the abdomen and pelvis was performed following the standard protocol without IV contrast. COMPARISON:  CT abdomen 09/05/2018 FINDINGS: Lower chest: Large left pleural effusion, considerably increased from 09/05/2018. Associated passive atelectasis. Bilateral breast implants. Mild atelectasis in the right lower lobe. Hepatobiliary: Mild dependent density in the gallbladder potentially from sludge or gallstones. Pancreas: Displacement of the pancreatic tail due to the large hemorrhagic  lesion of the left kidney. Spleen: Unremarkable Adrenals/Urinary Tract: A complex/hemorrhagic left kidney upper pole mass measures 17.6 by 15.3 by 19.1 cm (volume = 2690 cm^3) and contains internal complexity and heterogeneity along with some calcifications. There may be a tiny locule of gas density along the upper margin of this lesion on image 25/3. By my measurements, this lesion was previously 16.6 by 12.4 by 17.5 cm (volume = 1890 cm^3), and accordingly has increased in volume by 42% over the last 10 days. There is some associated flattening of the left kidney as well as a small amount of additional blood products in the left perirenal space. The kidney is also displaced inferiorly and anteriorly by this process. There high density in the urinary bladder which could be from blood products or previously excreted contrast medium. There  is contrast medium stasis in both kidneys likely left over from yesterday's angiogram Stomach/Bowel: Displaced bowel due to the left renal mass effect. No dilated bowel observed. Vascular/Lymphatic: Aortoiliac atherosclerotic vascular disease. Reproductive: Unremarkable Other: Small but abnormal amount of pelvic ascites. Subcutaneous edema along the flanks and pubis. Musculoskeletal: Unremarkable IMPRESSION: 1. Enlarging mass lesion associated with the left kidney, currently 2690 cubic cm, increased by 42% in volume over the last 10 days. This probably represents enlarging hematoma. Additional blood products are present in the perirenal space and the kidney is displaced inferiorly and anteriorly by this process. An underlying tumor is not excluded. 2. There is abnormal high density in both kidneys in a corticomedullary distribution. No contrast was administered during today's exam and this likely represents stasis of contrast from yesterday's angiogram, indicating renal dysfunction. High density in the urinary bladder may reflect a small amount of excreted contrast medium. 3. Large left pleural effusion, increased from 09/05/2018, with associated passive atelectasis. 4. Small amount of pelvic ascites, nonspecific. There is also subcutaneous edema along the flanks and pubis. 5. Mild dependent density in the gallbladder potentially from sludge or gallstones. Electronically Signed   By: Van Clines M.D.   On: 09/15/2018 23:57   Ir Angiogram Renal Left Selective  Result Date: 09/15/2018 INDICATION: Left perinephric hematoma EXAM: RENAL ANGIOGRAM MEDICATIONS: None. The antibiotic was administered within 1 hour of the procedure ANESTHESIA/SEDATION: Moderate (conscious) sedation was employed during this procedure. A total of Versed 3 mg and Fentanyl 100 mcg was administered intravenously. Moderate Sedation Time: 43 minutes. The patient's level of consciousness and vital signs were monitored continuously by  radiology nursing throughout the procedure under my direct supervision. CONTRAST:  50 cc Visipaque 320 and carbon dioxide FLUOROSCOPY TIME:  Fluoroscopy Time: 13 minutes 54 seconds (144 mGy). COMPLICATIONS: None immediate. PROCEDURE: Informed consent was obtained from the patient following explanation of the procedure, risks, benefits and alternatives. The patient understands, agrees and consents for the procedure. All questions were addressed. A time out was performed prior to the initiation of the procedure. Maximal barrier sterile technique utilized including caps, mask, sterile gowns, sterile gloves, large sterile drape, hand hygiene, and Betadine prep. The right groin was prepped and draped in a sterile fashion. 1% lidocaine was utilized for local anesthesia. Under sonographic guidance, a micropuncture needle was inserted into the right common femoral artery and removed over a 018 wire. The common femoral artery was noted to be patent and sonographic documentation was obtained. The 018 wire was up sized to a Bentson. A 5 French sheath was inserted. A 5 French pigtail catheter was inserted. It was advanced into the aorta. Carbon dioxide abdominal aortography was performed.  The left renal artery could not be identified. There was filling of the celiac, SMA, and right renal artery. A iodinated injection in the aorta was performed and imaging was obtained. Once again, the left renal artery was not opacified Initially, the pigtail catheter was exchanged over a Bentson wire for a cobra 2 catheter. An attempt was made to select the left renal artery. This was then exchanged for a 5 [place] Sos Omni catheter. Several vessels were selected. The celiac axis was selected, the SMA was selected, the right renal artery was selected, and 2 left lumbar arteries were also leg did successively and contrast was injected with angiogram images. An exhaustive search for the left renal artery was performed. The left renal artery  cannot be identified. FINDINGS: Carbon dioxide abdominal aortography delineates aortic anatomy. Selective injection of the right renal artery demonstrates patency of the main renal artery and branches. Injection of the celiac axis confirms patency of the hepatic and splenic arteries. Injection of the SMA demonstrates no tumor vascularity or contrast extravasation. Selective injection of 2 successive left lumbar arteries demonstrates nodal tumor vascularity or obvious supply to the left kidney. IMPRESSION: Exhaustive angiography was performed in the identified occasion of the left renal artery. The left renal artery cannot be identified. Injection of 2 left lumbar arteries, celiac, SMA, and the right renal artery was performed. The left renal artery is either occluded or under spasm. When feasible, CT angiogram of the abdomen is recommended. Electronically Signed   By: Marybelle Killings M.D.   On: 09/15/2018 08:36   Ir Angiogram Visceral Selective  Result Date: 09/15/2018 INDICATION: Left perinephric hematoma EXAM: RENAL ANGIOGRAM MEDICATIONS: None. The antibiotic was administered within 1 hour of the procedure ANESTHESIA/SEDATION: Moderate (conscious) sedation was employed during this procedure. A total of Versed 3 mg and Fentanyl 100 mcg was administered intravenously. Moderate Sedation Time: 43 minutes. The patient's level of consciousness and vital signs were monitored continuously by radiology nursing throughout the procedure under my direct supervision. CONTRAST:  50 cc Visipaque 320 and carbon dioxide FLUOROSCOPY TIME:  Fluoroscopy Time: 13 minutes 54 seconds (144 mGy). COMPLICATIONS: None immediate. PROCEDURE: Informed consent was obtained from the patient following explanation of the procedure, risks, benefits and alternatives. The patient understands, agrees and consents for the procedure. All questions were addressed. A time out was performed prior to the initiation of the procedure. Maximal barrier sterile  technique utilized including caps, mask, sterile gowns, sterile gloves, large sterile drape, hand hygiene, and Betadine prep. The right groin was prepped and draped in a sterile fashion. 1% lidocaine was utilized for local anesthesia. Under sonographic guidance, a micropuncture needle was inserted into the right common femoral artery and removed over a 018 wire. The common femoral artery was noted to be patent and sonographic documentation was obtained. The 018 wire was up sized to a Bentson. A 5 French sheath was inserted. A 5 French pigtail catheter was inserted. It was advanced into the aorta. Carbon dioxide abdominal aortography was performed. The left renal artery could not be identified. There was filling of the celiac, SMA, and right renal artery. A iodinated injection in the aorta was performed and imaging was obtained. Once again, the left renal artery was not opacified Initially, the pigtail catheter was exchanged over a Bentson wire for a cobra 2 catheter. An attempt was made to select the left renal artery. This was then exchanged for a 5 [place] Sos Omni catheter. Several vessels were selected. The celiac axis was selected,  the SMA was selected, the right renal artery was selected, and 2 left lumbar arteries were also leg did successively and contrast was injected with angiogram images. An exhaustive search for the left renal artery was performed. The left renal artery cannot be identified. FINDINGS: Carbon dioxide abdominal aortography delineates aortic anatomy. Selective injection of the right renal artery demonstrates patency of the main renal artery and branches. Injection of the celiac axis confirms patency of the hepatic and splenic arteries. Injection of the SMA demonstrates no tumor vascularity or contrast extravasation. Selective injection of 2 successive left lumbar arteries demonstrates nodal tumor vascularity or obvious supply to the left kidney. IMPRESSION: Exhaustive angiography was  performed in the identified occasion of the left renal artery. The left renal artery cannot be identified. Injection of 2 left lumbar arteries, celiac, SMA, and the right renal artery was performed. The left renal artery is either occluded or under spasm. When feasible, CT angiogram of the abdomen is recommended. Electronically Signed   By: Marybelle Killings M.D.   On: 09/15/2018 08:36   Ir Angiogram Visceral Selective  Result Date: 09/15/2018 INDICATION: Left perinephric hematoma EXAM: RENAL ANGIOGRAM MEDICATIONS: None. The antibiotic was administered within 1 hour of the procedure ANESTHESIA/SEDATION: Moderate (conscious) sedation was employed during this procedure. A total of Versed 3 mg and Fentanyl 100 mcg was administered intravenously. Moderate Sedation Time: 43 minutes. The patient's level of consciousness and vital signs were monitored continuously by radiology nursing throughout the procedure under my direct supervision. CONTRAST:  50 cc Visipaque 320 and carbon dioxide FLUOROSCOPY TIME:  Fluoroscopy Time: 13 minutes 54 seconds (144 mGy). COMPLICATIONS: None immediate. PROCEDURE: Informed consent was obtained from the patient following explanation of the procedure, risks, benefits and alternatives. The patient understands, agrees and consents for the procedure. All questions were addressed. A time out was performed prior to the initiation of the procedure. Maximal barrier sterile technique utilized including caps, mask, sterile gowns, sterile gloves, large sterile drape, hand hygiene, and Betadine prep. The right groin was prepped and draped in a sterile fashion. 1% lidocaine was utilized for local anesthesia. Under sonographic guidance, a micropuncture needle was inserted into the right common femoral artery and removed over a 018 wire. The common femoral artery was noted to be patent and sonographic documentation was obtained. The 018 wire was up sized to a Bentson. A 5 French sheath was inserted. A 5  French pigtail catheter was inserted. It was advanced into the aorta. Carbon dioxide abdominal aortography was performed. The left renal artery could not be identified. There was filling of the celiac, SMA, and right renal artery. A iodinated injection in the aorta was performed and imaging was obtained. Once again, the left renal artery was not opacified Initially, the pigtail catheter was exchanged over a Bentson wire for a cobra 2 catheter. An attempt was made to select the left renal artery. This was then exchanged for a 5 [place] Sos Omni catheter. Several vessels were selected. The celiac axis was selected, the SMA was selected, the right renal artery was selected, and 2 left lumbar arteries were also leg did successively and contrast was injected with angiogram images. An exhaustive search for the left renal artery was performed. The left renal artery cannot be identified. FINDINGS: Carbon dioxide abdominal aortography delineates aortic anatomy. Selective injection of the right renal artery demonstrates patency of the main renal artery and branches. Injection of the celiac axis confirms patency of the hepatic and splenic arteries. Injection of the SMA  demonstrates no tumor vascularity or contrast extravasation. Selective injection of 2 successive left lumbar arteries demonstrates nodal tumor vascularity or obvious supply to the left kidney. IMPRESSION: Exhaustive angiography was performed in the identified occasion of the left renal artery. The left renal artery cannot be identified. Injection of 2 left lumbar arteries, celiac, SMA, and the right renal artery was performed. The left renal artery is either occluded or under spasm. When feasible, CT angiogram of the abdomen is recommended. Electronically Signed   By: Marybelle Killings M.D.   On: 09/15/2018 08:36   Ir Angio/spinal Left  Result Date: 09/15/2018 INDICATION: Left perinephric hematoma EXAM: RENAL ANGIOGRAM MEDICATIONS: None. The antibiotic was  administered within 1 hour of the procedure ANESTHESIA/SEDATION: Moderate (conscious) sedation was employed during this procedure. A total of Versed 3 mg and Fentanyl 100 mcg was administered intravenously. Moderate Sedation Time: 43 minutes. The patient's level of consciousness and vital signs were monitored continuously by radiology nursing throughout the procedure under my direct supervision. CONTRAST:  50 cc Visipaque 320 and carbon dioxide FLUOROSCOPY TIME:  Fluoroscopy Time: 13 minutes 54 seconds (144 mGy). COMPLICATIONS: None immediate. PROCEDURE: Informed consent was obtained from the patient following explanation of the procedure, risks, benefits and alternatives. The patient understands, agrees and consents for the procedure. All questions were addressed. A time out was performed prior to the initiation of the procedure. Maximal barrier sterile technique utilized including caps, mask, sterile gowns, sterile gloves, large sterile drape, hand hygiene, and Betadine prep. The right groin was prepped and draped in a sterile fashion. 1% lidocaine was utilized for local anesthesia. Under sonographic guidance, a micropuncture needle was inserted into the right common femoral artery and removed over a 018 wire. The common femoral artery was noted to be patent and sonographic documentation was obtained. The 018 wire was up sized to a Bentson. A 5 French sheath was inserted. A 5 French pigtail catheter was inserted. It was advanced into the aorta. Carbon dioxide abdominal aortography was performed. The left renal artery could not be identified. There was filling of the celiac, SMA, and right renal artery. A iodinated injection in the aorta was performed and imaging was obtained. Once again, the left renal artery was not opacified Initially, the pigtail catheter was exchanged over a Bentson wire for a cobra 2 catheter. An attempt was made to select the left renal artery. This was then exchanged for a 5 [place] Sos Omni  catheter. Several vessels were selected. The celiac axis was selected, the SMA was selected, the right renal artery was selected, and 2 left lumbar arteries were also leg did successively and contrast was injected with angiogram images. An exhaustive search for the left renal artery was performed. The left renal artery cannot be identified. FINDINGS: Carbon dioxide abdominal aortography delineates aortic anatomy. Selective injection of the right renal artery demonstrates patency of the main renal artery and branches. Injection of the celiac axis confirms patency of the hepatic and splenic arteries. Injection of the SMA demonstrates no tumor vascularity or contrast extravasation. Selective injection of 2 successive left lumbar arteries demonstrates nodal tumor vascularity or obvious supply to the left kidney. IMPRESSION: Exhaustive angiography was performed in the identified occasion of the left renal artery. The left renal artery cannot be identified. Injection of 2 left lumbar arteries, celiac, SMA, and the right renal artery was performed. The left renal artery is either occluded or under spasm. When feasible, CT angiogram of the abdomen is recommended. Electronically Signed   By:  Marybelle Killings M.D.   On: 09/15/2018 08:36   Ir Angio/spinal Left  Result Date: 09/15/2018 INDICATION: Left perinephric hematoma EXAM: RENAL ANGIOGRAM MEDICATIONS: None. The antibiotic was administered within 1 hour of the procedure ANESTHESIA/SEDATION: Moderate (conscious) sedation was employed during this procedure. A total of Versed 3 mg and Fentanyl 100 mcg was administered intravenously. Moderate Sedation Time: 43 minutes. The patient's level of consciousness and vital signs were monitored continuously by radiology nursing throughout the procedure under my direct supervision. CONTRAST:  50 cc Visipaque 320 and carbon dioxide FLUOROSCOPY TIME:  Fluoroscopy Time: 13 minutes 54 seconds (144 mGy). COMPLICATIONS: None immediate.  PROCEDURE: Informed consent was obtained from the patient following explanation of the procedure, risks, benefits and alternatives. The patient understands, agrees and consents for the procedure. All questions were addressed. A time out was performed prior to the initiation of the procedure. Maximal barrier sterile technique utilized including caps, mask, sterile gowns, sterile gloves, large sterile drape, hand hygiene, and Betadine prep. The right groin was prepped and draped in a sterile fashion. 1% lidocaine was utilized for local anesthesia. Under sonographic guidance, a micropuncture needle was inserted into the right common femoral artery and removed over a 018 wire. The common femoral artery was noted to be patent and sonographic documentation was obtained. The 018 wire was up sized to a Bentson. A 5 French sheath was inserted. A 5 French pigtail catheter was inserted. It was advanced into the aorta. Carbon dioxide abdominal aortography was performed. The left renal artery could not be identified. There was filling of the celiac, SMA, and right renal artery. A iodinated injection in the aorta was performed and imaging was obtained. Once again, the left renal artery was not opacified Initially, the pigtail catheter was exchanged over a Bentson wire for a cobra 2 catheter. An attempt was made to select the left renal artery. This was then exchanged for a 5 [place] Sos Omni catheter. Several vessels were selected. The celiac axis was selected, the SMA was selected, the right renal artery was selected, and 2 left lumbar arteries were also leg did successively and contrast was injected with angiogram images. An exhaustive search for the left renal artery was performed. The left renal artery cannot be identified. FINDINGS: Carbon dioxide abdominal aortography delineates aortic anatomy. Selective injection of the right renal artery demonstrates patency of the main renal artery and branches. Injection of the celiac  axis confirms patency of the hepatic and splenic arteries. Injection of the SMA demonstrates no tumor vascularity or contrast extravasation. Selective injection of 2 successive left lumbar arteries demonstrates nodal tumor vascularity or obvious supply to the left kidney. IMPRESSION: Exhaustive angiography was performed in the identified occasion of the left renal artery. The left renal artery cannot be identified. Injection of 2 left lumbar arteries, celiac, SMA, and the right renal artery was performed. The left renal artery is either occluded or under spasm. When feasible, CT angiogram of the abdomen is recommended. Electronically Signed   By: Marybelle Killings M.D.   On: 09/15/2018 08:36   Ir US Guide Vasc Access Right  Result Date: 09/15/2018 INDICATION: Left perinephric hematoma EXAM: RENAL ANGIOGRAM MEDICATIONS: None. The antibiotic was administered within 1 hour of the procedure ANESTHESIA/SEDATION: Moderate (conscious) sedation was employed during this procedure. A total of Versed 3 mg and Fentanyl 100 mcg was administered intravenously. Moderate Sedation Time: 43 minutes. The patient's level of consciousness and vital signs were monitored continuously by radiology nursing throughout the procedure under my  direct supervision. CONTRAST:  50 cc Visipaque 320 and carbon dioxide FLUOROSCOPY TIME:  Fluoroscopy Time: 13 minutes 54 seconds (144 mGy). COMPLICATIONS: None immediate. PROCEDURE: Informed consent was obtained from the patient following explanation of the procedure, risks, benefits and alternatives. The patient understands, agrees and consents for the procedure. All questions were addressed. A time out was performed prior to the initiation of the procedure. Maximal barrier sterile technique utilized including caps, mask, sterile gowns, sterile gloves, large sterile drape, hand hygiene, and Betadine prep. The right groin was prepped and draped in a sterile fashion. 1% lidocaine was utilized for local  anesthesia. Under sonographic guidance, a micropuncture needle was inserted into the right common femoral artery and removed over a 018 wire. The common femoral artery was noted to be patent and sonographic documentation was obtained. The 018 wire was up sized to a Bentson. A 5 French sheath was inserted. A 5 French pigtail catheter was inserted. It was advanced into the aorta. Carbon dioxide abdominal aortography was performed. The left renal artery could not be identified. There was filling of the celiac, SMA, and right renal artery. A iodinated injection in the aorta was performed and imaging was obtained. Once again, the left renal artery was not opacified Initially, the pigtail catheter was exchanged over a Bentson wire for a cobra 2 catheter. An attempt was made to select the left renal artery. This was then exchanged for a 5 [place] Sos Omni catheter. Several vessels were selected. The celiac axis was selected, the SMA was selected, the right renal artery was selected, and 2 left lumbar arteries were also leg did successively and contrast was injected with angiogram images. An exhaustive search for the left renal artery was performed. The left renal artery cannot be identified. FINDINGS: Carbon dioxide abdominal aortography delineates aortic anatomy. Selective injection of the right renal artery demonstrates patency of the main renal artery and branches. Injection of the celiac axis confirms patency of the hepatic and splenic arteries. Injection of the SMA demonstrates no tumor vascularity or contrast extravasation. Selective injection of 2 successive left lumbar arteries demonstrates nodal tumor vascularity or obvious supply to the left kidney. IMPRESSION: Exhaustive angiography was performed in the identified occasion of the left renal artery. The left renal artery cannot be identified. Injection of 2 left lumbar arteries, celiac, SMA, and the right renal artery was performed. The left renal artery is either  occluded or under spasm. When feasible, CT angiogram of the abdomen is recommended. Electronically Signed   By: Marybelle Killings M.D.   On: 09/15/2018 08:36   Medications: . sodium chloride 250 mL (09/15/18 2049)   . sodium chloride   Intravenous Once  . sodium chloride   Intravenous Once  . feeding supplement (ENSURE ENLIVE)  237 mL Oral BID BM  . multivitamin with minerals  1 tablet Oral Daily   Assessment/Plan: 1.  AKI:  Resolving;   baseline normal GFR 09/2017.   Suspected 2/2 ATN  from her acute blood loss anemia and potential compromise of left kidney nephron mass as unable to identify left renal artery at time of angiogram at 10/70;   At this point no evidence for contrast nephropathy from 10/7 angiogram  2.  Hemorrhagic renal mass: urology following; plan for surgery 10/15 3.  Mild Hyponatremia:  Worsened. Asx. Probably SIADH from pain, and excess PO intake.  Will send of SOsm, UOsms, UNa. 1.2L fluid restrict.  Follow for now  4.  Anemia: acute blood loss anemia.  Transfusion  per primary. No ESA indicated  5.  HTN: no history of HTN but has been fairly hypertensive here -likely driven by pain and potential renin secretion by left kidney; does not need RAAS inhibitor at the current time  Pearson Grippe 09/16/2018, 11:16 AM  [employer]

## 2018-09-17 LAB — BASIC METABOLIC PANEL
Anion gap: 11 (ref 5–15)
BUN: 36 mg/dL — AB (ref 6–20)
CALCIUM: 8.4 mg/dL — AB (ref 8.9–10.3)
CO2: 25 mmol/L (ref 22–32)
CREATININE: 1.29 mg/dL — AB (ref 0.44–1.00)
Chloride: 95 mmol/L — ABNORMAL LOW (ref 98–111)
GFR calc non Af Amer: 44 mL/min — ABNORMAL LOW (ref >60)
GFR, EST AFRICAN AMERICAN: 51 mL/min — AB (ref >60)
Glucose, Bld: 122 mg/dL — ABNORMAL HIGH (ref 70–99)
Potassium: 4.1 mmol/L (ref 3.5–5.1)
Sodium: 131 mmol/L — ABNORMAL LOW (ref 135–145)

## 2018-09-17 LAB — CBC
HEMATOCRIT: 24.2 % — AB (ref 36.0–46.0)
Hemoglobin: 7.7 g/dL — ABNORMAL LOW (ref 12.0–15.0)
MCH: 28.7 pg (ref 26.0–34.0)
MCHC: 31.8 g/dL (ref 30.0–36.0)
MCV: 90.3 fL (ref 80.0–100.0)
Platelets: 347 10*3/uL (ref 150–400)
RBC: 2.68 MIL/uL — ABNORMAL LOW (ref 3.87–5.11)
RDW: 14.6 % (ref 11.5–15.5)
WBC: 34.4 10*3/uL — AB (ref 4.0–10.5)
nRBC: 0 % (ref 0.0–0.2)

## 2018-09-17 MED ORDER — HEPARIN SODIUM (PORCINE) 1000 UNIT/ML IJ SOLN: 1400.0000 [IU] | INTRAMUSCULAR | Status: DC

## 2018-09-17 MED ORDER — HEPARIN SODIUM (PORCINE) 1000 UNIT/ML IJ SOLN
2800.0000 [IU] | Freq: Once | INTRAMUSCULAR | Status: AC
  Administered 2018-09-17: 2800 [IU] via INTRAVENOUS

## 2018-09-17 NOTE — Progress Notes (Signed)
   Subjective: Sharon Henson reported feeling the same today. She still feels very distended in her abdomen and swollen in her legs. She noticed she has been sweating a lot and concerned about fevers. She said she is still urinating and not noticing much blood, slightly pink urine. She ate some eggs for breakfast and had ensure. She reported that the gas pills help her but she has not had a bowel movement in days. All questions and concerns were addressed.   Objective:  Vital signs in last 24 hours: Vitals:   09/16/18 2343 09/17/18 0356 09/17/18 0402 09/17/18 0756  BP:  (!) 146/80  (!) 144/95  Pulse:  (!) 113  (!) 102  Resp:  (!) 22  20  Temp: 97.9 F (36.6 C)  98.1 F (36.7 C) 97.7 F (36.5 C)  TempSrc: Oral  Oral Oral  SpO2:  96%  (!) 89%  Weight:      Height:       General- diaphoretic, uncomfortably lying in bed Heart- RRR, no murmurs Lungs- bibasilar crackles appreciated Abdomen- very distended, bowel sounds present Extremities- non pitting edema  Assessment/Plan:  Principal Problem:   Retroperitoneal hematoma Active Problems:   Left renal mass   Pilar Plate hematuria   Acute renal failure (HCC)   Acute blood loss anemia  Sharon Henson is a 57 yo female with no PMHx who presented with acute onset left sided flank pain, abdominal distension and hematuria for three days. She was found to havea hemorrhagic mass arising from the left kidney, worrisome for RCC.  Acute blood loss anemia 2/2 retroperitoneal hematoma - Urology is still planning for surgery on 10/15 but will intervene sooner if patient becomes hemodynamically unstable - patient ishypertensive,mildly tachycardic, abdomen very distended  - last night 8 pm H&H 8.3; she has received a total of 7 units of PRBC since admission - 8 am H & H 7.7, stable overnight  - transfuse as needed to keep hgb >7, shehas received4units of blood thus farthis admission - CBC q24h - appreciate urology recommendations  Acute renal  failure - Patient's creatinine continuing to down trend, 1.29 . Etiology thought to be due to ATN. Urine output for the past 24 hours has been~0.75 L -strict I's and O's - bmet q24h - appreciate nephrology recommendations  HTN 146/80. Possibly due to hemorrhagic compression of renal vasculatureand volume status.158/101 - continue to monitor  Dispo: Patient will need to be transferred to [place] a day or so before her scheduled surgery on 10/15. Will start arranging for this discharge and transfer. She is not stable for discharge to home due to active bleeding, HTN and acute renal failure.   Rehman, Areeg N, DO 09/17/2018, 1:25 PM Pager: [phone]

## 2018-09-17 NOTE — Progress Notes (Signed)
Medicine attending: I examined this patient today together with resident physician Dr Harlow Ohms and I concur with her evaluation and management plan which will be detailed in her subsequent progress note. Condition has returned to her baseline.  Rate of fall in the hemoglobin has slowed.  White count subsiding. Renal function continues to improve with creatinine down to 1.3.  Urine output improved over the last 24 hours. Rales at the lung bases right greater than left.  Patient encouraged to get out of bed, sit in the chair.  Use incentive spirometer. Abdomen remains distended, and tense from known massive intra-abdominal hematoma. We will hold off on transfusions again today but anticipate transfusing 2 units tomorrow in preparation for upcoming surgery. We will need to interface with our hospitalist colleagues since I believe surgery is planned to be done at [facility].

## 2018-09-17 NOTE — Progress Notes (Signed)
[facility] Progress Note   Subjective:      No interval developments  Sodium was 131 this morning  Creatinine 1.3  Urine output at least 0.8 L yesterday  Hemoglobin 7.7 this morning  SOsm 275, UNa < 10, UOsms 311  Objective Vitals:   09/16/18 2343 09/17/18 0356 09/17/18 0402 09/17/18 0756  BP:  (!) 146/80  (!) 144/95  Pulse:  (!) 113  (!) 102  Resp:  (!) 22  20  Temp: 97.9 F (36.6 C)  98.1 F (36.7 C) 97.7 F (36.5 C)  TempSrc: Oral  Oral Oral  SpO2:  96%  (!) 89%  Weight:      Height:       Physical Exam General:  Lying quietly in bed flat Heart: RRR, no rub Lungs: normal WOB, clear Abdomen: soft, mod distended, +BS Extremities: 1-2+edema diffusely - Nonfocal, AAO x3   Additional Objective Labs: Basic Metabolic Panel: Recent Labs  Lab 09/15/18 0829 09/16/18 0404 09/17/18 0808  NA 132* 124* 131*  K 4.0 4.1 4.1  CL 98 90* 95*  CO2 23 25 25   GLUCOSE 99 133* 122*  BUN 42* 42* 36*  CREATININE 1.72* 1.56* 1.29*  CALCIUM 8.2* 7.8* 8.4*   Liver Function Tests: No results for input(s): AST, ALT, ALKPHOS, BILITOT, PROT, ALBUMIN in the last 168 hours. No results for input(s): LIPASE, AMYLASE in the last 168 hours. CBC: Recent Labs  Lab 09/11/18 0658  09/14/18 0305 09/14/18 0945  09/15/18 0829  09/16/18 0800 09/16/18 2218 09/17/18 0808  WBC 24.1*   < > 41.0* 36.2*  --  47.2*  --   --  38.8* 34.4*  NEUTROABS 19.6*  --   --   --   --   --   --   --   --   --   HGB 8.3*   < > 7.5* 6.6*   < > 8.2*   < > 7.8* 8.3* 7.7*  HCT 25.5*   < > 22.2* 19.4*   < > 24.1*   < > 23.6* 25.0* 24.2*  MCV 85.9   < > 87.4 86.6  --  87.0  --   --  88.7 90.3  PLT 357   < > 233 248  --  217  --   --  329 347   < > = values in this interval not displayed.   Blood Culture    Component Value Date/Time   SDES URINE, RANDOM 09/05/2018 1322   SPECREQUEST NONE 09/05/2018 1322   CULT  09/05/2018 1322    NO GROWTH Performed at [facility], [place] [address], [place], [place] [address]    REPTSTATUS 09/06/2018 FINAL 09/05/2018 1322    Cardiac Enzymes: No results for input(s): CKTOTAL, CKMB, CKMBINDEX, TROPONINI in the last 168 hours. CBG: No results for input(s): GLUCAP in the last 168 hours. Iron Studies: No results for input(s): IRON, TIBC, TRANSFERRIN, FERRITIN in the last 72 hours. @lablastinr3 @ Studies/Results: Ct Abdomen Pelvis Wo Contrast  Result Date: 09/15/2018 CLINICAL DATA:  Hemorrhagic renal cyst reassessment EXAM: CT ABDOMEN AND PELVIS WITHOUT CONTRAST TECHNIQUE: Multidetector CT imaging of the abdomen and pelvis was performed following the standard protocol without IV contrast. COMPARISON:  CT abdomen 09/05/2018 FINDINGS: Lower chest: Large left pleural effusion, considerably increased from 09/05/2018. Associated passive atelectasis. Bilateral breast implants. Mild atelectasis in the right lower lobe. Hepatobiliary: Mild dependent density in the gallbladder potentially from sludge or gallstones. Pancreas: Displacement of the pancreatic tail  due to the large hemorrhagic lesion of the left kidney. Spleen: Unremarkable Adrenals/Urinary Tract: A complex/hemorrhagic left kidney upper pole mass measures 17.6 by 15.3 by 19.1 cm (volume = 2690 cm^3) and contains internal complexity and heterogeneity along with some calcifications. There may be a tiny locule of gas density along the upper margin of this lesion on image 25/3. By my measurements, this lesion was previously 16.6 by 12.4 by 17.5 cm (volume = 1890 cm^3), and accordingly has increased in volume by 42% over the last 10 days. There is some associated flattening of the left kidney as well as a small amount of additional blood products in the left perirenal space. The kidney is also displaced inferiorly and anteriorly by this process. There high density in the urinary bladder which could be from blood products or previously excreted contrast medium. There is contrast medium stasis in both  kidneys likely left over from yesterday's angiogram Stomach/Bowel: Displaced bowel due to the left renal mass effect. No dilated bowel observed. Vascular/Lymphatic: Aortoiliac atherosclerotic vascular disease. Reproductive: Unremarkable Other: Small but abnormal amount of pelvic ascites. Subcutaneous edema along the flanks and pubis. Musculoskeletal: Unremarkable IMPRESSION: 1. Enlarging mass lesion associated with the left kidney, currently 2690 cubic cm, increased by 42% in volume over the last 10 days. This probably represents enlarging hematoma. Additional blood products are present in the perirenal space and the kidney is displaced inferiorly and anteriorly by this process. An underlying tumor is not excluded. 2. There is abnormal high density in both kidneys in a corticomedullary distribution. No contrast was administered during today's exam and this likely represents stasis of contrast from yesterday's angiogram, indicating renal dysfunction. High density in the urinary bladder may reflect a small amount of excreted contrast medium. 3. Large left pleural effusion, increased from 09/05/2018, with associated passive atelectasis. 4. Small amount of pelvic ascites, nonspecific. There is also subcutaneous edema along the flanks and pubis. 5. Mild dependent density in the gallbladder potentially from sludge or gallstones. Electronically Signed   By: Van Clines M.D.   On: 09/15/2018 23:57   Medications: . sodium chloride 250 mL (09/15/18 2049)   . sodium chloride   Intravenous Once  . sodium chloride   Intravenous Once  . feeding supplement (ENSURE ENLIVE)  237 mL Oral BID BM  . multivitamin with minerals  1 tablet Oral Daily   Assessment/Plan: 1.  AKI:  Resolving;   baseline normal GFR 09/2017.   Suspected 2/2 ATN  from her acute blood loss anemia and potential compromise of left kidney nephron mass as unable to identify left renal artery at time of angiogram at 10/70;   2.  Hemorrhagic  renal mass: urology following; plan for surgery / nephrectomy 10/15 3.  Mild Hyponatremia:  Worsened. Asx. Probably SIADH from pain, and excess PO intake vs low solute and too much water;  Improved with better food intake and some fluid restriction  4.  Anemia: acute blood loss anemia.  Transfusion per primary. No ESA indicated  5.  HTN: no history of HTN but has been fairly hypertensive here -likely driven by pain and potential renin secretion by left kidney; does not need RAAS inhibitor at the current time  Will sign off for now.  Cont to follow daily RFP.  Will need f/u in our office, will make sure is arranged with Dr. Johnney Ou.   Pearson Grippe 09/17/2018, 11:53 AM  [employer]

## 2018-09-18 LAB — CBC
HEMATOCRIT: 25.8 % — AB (ref 36.0–46.0)
HEMOGLOBIN: 7.9 g/dL — AB (ref 12.0–15.0)
MCH: 27.9 pg (ref 26.0–34.0)
MCHC: 30.6 g/dL (ref 30.0–36.0)
MCV: 91.2 fL (ref 80.0–100.0)
NRBC: 0 % (ref 0.0–0.2)
Platelets: 372 10*3/uL (ref 150–400)
RBC: 2.83 MIL/uL — AB (ref 3.87–5.11)
RDW: 14.5 % (ref 11.5–15.5)
WBC: 26.7 10*3/uL — AB (ref 4.0–10.5)

## 2018-09-18 LAB — BASIC METABOLIC PANEL
ANION GAP: 12 (ref 5–15)
BUN: 37 mg/dL — ABNORMAL HIGH (ref 6–20)
CHLORIDE: 95 mmol/L — AB (ref 98–111)
CO2: 28 mmol/L (ref 22–32)
Calcium: 8.5 mg/dL — ABNORMAL LOW (ref 8.9–10.3)
Creatinine, Ser: 1.17 mg/dL — ABNORMAL HIGH (ref 0.44–1.00)
GFR calc non Af Amer: 50 mL/min — ABNORMAL LOW (ref >60)
GFR, EST AFRICAN AMERICAN: 58 mL/min — AB (ref >60)
Glucose, Bld: 116 mg/dL — ABNORMAL HIGH (ref 70–99)
POTASSIUM: 4.3 mmol/L (ref 3.5–5.1)
SODIUM: 135 mmol/L (ref 135–145)

## 2018-09-18 MED ORDER — ALTEPLASE 2 MG IJ SOLR
2.0000 mg | Freq: Once | INTRAMUSCULAR | Status: DC
  Filled 2018-09-18: qty 2

## 2018-09-18 MED ORDER — ALTEPLASE 2 MG IJ SOLR
2.0000 mg | Freq: Once | INTRAMUSCULAR | Status: AC
  Administered 2018-09-18: 2 mg

## 2018-09-18 MED ORDER — HYDROMORPHONE HCL 2 MG PO TABS
1.0000 mg | ORAL_TABLET | ORAL | Status: DC | PRN
  Administered 2018-09-18 (×5): 2 mg via ORAL
  Filled 2018-09-18 (×5): qty 1

## 2018-09-18 MED ORDER — HYDROMORPHONE HCL 1 MG/ML IJ SOLN
1.0000 mg | INTRAMUSCULAR | Status: DC | PRN
  Administered 2018-09-18 – 2018-09-22 (×35): 2 mg via INTRAVENOUS
  Filled 2018-09-18 (×35): qty 2

## 2018-09-18 NOTE — Progress Notes (Signed)
Notified MD Rehman of patient's systolic BP 761'-470'L. MD indicated will continue to monitor.

## 2018-09-18 NOTE — Progress Notes (Signed)
This RN was notified by Lilibeth,RN that this patient's pigtail has no blood return and that TPA is needed. This RN notified Lee,MD with [employer] about the order for TPA. Lee,MD informed this RN and the IV team RN that the patient would not be able to receive the TPA and that patient would no longer be unable to receive IV pain meds through the pigtail. Lee,MD said he would speak to the dayshift team about further use of the TPA. Lee,MD also changed patient's PRN IV dilaudid to PO dilaudid. Patient's bedside RN made aware.

## 2018-09-18 NOTE — Progress Notes (Signed)
Unable to obtain blood from Pt.'s Pigtail (HD cath) . Instillation of TPA was not recommended by  Dr. Kandice Moos due to pt. Having hematoma on her kidney, possible for bleeding. RN also made aware.  Blood draw will be done by lab today. Lab notified.

## 2018-09-18 NOTE — Progress Notes (Signed)
   Subjective: Ms. Mcfate reported she was in a lot of pain this morning as she was waiting for her pain medications. She was switched to oral pain meds overnight due to her HD catheter being clogged. She said her abdominal distension and leg swelling feels the same. She is still having numbness in her legs. She is urinating well and is no longer seeing blood in her urine. She had a small bowel movement yesterday.   Objective:  Vital signs in last 24 hours: Vitals:   09/17/18 2357 09/18/18 0041 09/18/18 0354 09/18/18 0442  BP: (!) 160/99   (!) 152/94  Pulse: (!) 107     Resp: 14   (!) 23  Temp:   98.6 F (37 C) 98.1 F (36.7 C)  TempSrc:   Oral Oral  SpO2: 98%   100%  Weight:  64.8 kg    Height:       General- seen lying in bed, uncomfortable Heart- RRR, no murmurs Lungs- bilateral crackles, improved Abdomen- distended, LUQ and LLQ tenderness  Extremities- non pitting edema  Assessment/Plan:  Principal Problem:   Retroperitoneal hematoma Active Problems:   Left renal mass   Pilar Plate hematuria   Acute renal failure (HCC)   Acute blood loss anemia  Ms. Abdo is a 57 yo female with no PMHx who presented with acute onset left sided flank pain, abdominal distension and hematuria for three days. She was found to havea hemorrhagic mass arising from the left kidney, worrisome for RCC.  Acute blood loss anemia 2/2 retroperitoneal hematoma - Urology is still planning for surgery on 10/15 but will intervene sooner if patient becomes hemodynamically unstable - patient is stillhypertensive,mildlytachycardic, abdomen very distended  - am CBC showed Hgb stable at 7.9; she has received a total of 7 units of PRBC since admission; will plan to transfuse 2 units PRBC tomorrow to help prepare for surgery - transfuse as needed to keep hgb >7, shehas received4units of blood thus farthis admission - CBC q24h - appreciate urology recommendations  Acute renal failure - Patient's  creatinine continuing to down trend, 1.17  - Urine output for the past 24 hours has been~2.5 L -strict I's and O's - bmet q24h - appreciate nephrology recommendations  HTN 152/94. Possibly due to hemorrhagic compression of renal vasculatureand volume status. - continue to monitor  Dispo: Patient will need to be transferred to [place] a day or so before her scheduled surgery on 10/15. Will start arranging for this discharge and transfer. She is not stable for discharge to home due to active bleeding, HTN and acute renal failure.     Mike Craze, DO 09/18/2018, 6:53 AM Pager: [phone]

## 2018-09-19 DIAGNOSIS — N170 Acute kidney failure with tubular necrosis: Secondary | ICD-10-CM

## 2018-09-19 DIAGNOSIS — Z79899 Other long term (current) drug therapy: Secondary | ICD-10-CM

## 2018-09-19 LAB — PREPARE RBC (CROSSMATCH)

## 2018-09-19 LAB — CBC
HCT: 25.7 % — ABNORMAL LOW (ref 36.0–46.0)
Hemoglobin: 8.1 g/dL — ABNORMAL LOW (ref 12.0–15.0)
MCH: 28.7 pg (ref 26.0–34.0)
MCHC: 31.5 g/dL (ref 30.0–36.0)
MCV: 91.1 fL (ref 80.0–100.0)
NRBC: 0 % (ref 0.0–0.2)
PLATELETS: 372 10*3/uL (ref 150–400)
RBC: 2.82 MIL/uL — ABNORMAL LOW (ref 3.87–5.11)
RDW: 14.5 % (ref 11.5–15.5)
WBC: 25 10*3/uL — ABNORMAL HIGH (ref 4.0–10.5)

## 2018-09-19 LAB — BASIC METABOLIC PANEL
Anion gap: 10 (ref 5–15)
BUN: 23 mg/dL — AB (ref 6–20)
CALCIUM: 8.8 mg/dL — AB (ref 8.9–10.3)
CO2: 30 mmol/L (ref 22–32)
Chloride: 95 mmol/L — ABNORMAL LOW (ref 98–111)
Creatinine, Ser: 1.13 mg/dL — ABNORMAL HIGH (ref 0.44–1.00)
GFR calc non Af Amer: 52 mL/min — ABNORMAL LOW (ref >60)
Glucose, Bld: 142 mg/dL — ABNORMAL HIGH (ref 70–99)
Potassium: 4.1 mmol/L (ref 3.5–5.1)
SODIUM: 135 mmol/L (ref 135–145)

## 2018-09-19 MED ORDER — SODIUM CHLORIDE 0.9% IV SOLUTION
Freq: Once | INTRAVENOUS | Status: AC
  Administered 2018-09-19: 15:00:00 via INTRAVENOUS

## 2018-09-19 NOTE — Progress Notes (Signed)
   Subjective: patient was evaluated this morning. She reports improvement in her abdominal pain. She states that the abdominal distention has been stable. She reports no events overnight.  Objective:  Vital signs in last 24 hours: Vitals:   09/19/18 0000 09/19/18 0352 09/19/18 0418 09/19/18 0811  BP: (!) 160/101 (!) 144/81  (!) 143/94  Pulse: (!) 101 89  (!) 101  Resp: 19 13  17   Temp:   97.7 F (36.5 C) 97.8 F (36.6 C)  TempSrc:   Oral Oral  SpO2: 100% 97%  97%  Weight:      Height:       Physical Exam  Constitutional: She is well-developed, well-nourished, and in no distress.  Cardiovascular: Normal rate, regular rhythm and normal heart sounds. Exam reveals no gallop and no friction rub.  No murmur heard. Pulmonary/Chest: Effort normal and breath sounds normal. No respiratory distress. She has no wheezes. She has no rales.  Abdominal: Soft. Bowel sounds are normal. She exhibits distension. There is no tenderness.     Assessment/Plan:  Principal Problem:   Retroperitoneal hematoma Active Problems:   Left renal mass   Pilar Plate hematuria   Acute renal failure (HCC)   Acute blood loss anemia  Acute blood loss anemia secondary to hemorrhagic massive left perinephric hematoma  Etiology unclear. Patient's vitals have been stable overnight as well as hemoglobin. Hemoglobin is currently 8.  Urology would like Hgb higher prior to surgery.  Will give one unit of PRBC today.  Patient is scheduled for neph[facility] on 10/[facility] at [facility]. Will need to be transferred 10/13 in anticipation of surgery.   -[place] transfer tomorrow 10/13 - 1 unit of PRBC  ATN Creatinine has continued to improve throughout admission and is currently 1.1. Her urine output has been 2 L in the past 24 hours.  Not currently needing diuretics  .Will continue to monitor UOP. Will avoid nephrotoxic agents. -Avoid nephrotoxic agents -strict I's and Os -Bmet in am  Hypertension Blood pressures currently  stable. Would be very hesitant and cautious to make any changes in blood pressure at this time.  Will need to be reevaluated if blood pressure greater than 509 systolic.   Dispo: Anticipated discharge to [place] on 10/13 in anticipation of nephrectomy 10/15 by urology.   Kalman Shan Corcoran, DO 09/19/2018, 11:31 AM Pager: [phone]

## 2018-09-20 DIAGNOSIS — N9089 Other specified noninflammatory disorders of vulva and perineum: Secondary | ICD-10-CM

## 2018-09-20 LAB — BASIC METABOLIC PANEL
Anion gap: 11 (ref 5–15)
BUN: 22 mg/dL — ABNORMAL HIGH (ref 6–20)
CALCIUM: 8.5 mg/dL — AB (ref 8.9–10.3)
CO2: 30 mmol/L (ref 22–32)
CREATININE: 0.82 mg/dL (ref 0.44–1.00)
Chloride: 96 mmol/L — ABNORMAL LOW (ref 98–111)
GFR calc Af Amer: >60 mL/min (ref >60)
GFR calc non Af Amer: >60 mL/min (ref >60)
Glucose, Bld: 123 mg/dL — ABNORMAL HIGH (ref 70–99)
Potassium: 4.2 mmol/L (ref 3.5–5.1)
SODIUM: 137 mmol/L (ref 135–145)

## 2018-09-20 LAB — CBC
HCT: 27.3 % — ABNORMAL LOW (ref 36.0–46.0)
Hemoglobin: 8.6 g/dL — ABNORMAL LOW (ref 12.0–15.0)
MCH: 29 pg (ref 26.0–34.0)
MCHC: 31.5 g/dL (ref 30.0–36.0)
MCV: 91.9 fL (ref 80.0–100.0)
NRBC: 0 % (ref 0.0–0.2)
PLATELETS: 342 10*3/uL (ref 150–400)
RBC: 2.97 MIL/uL — ABNORMAL LOW (ref 3.87–5.11)
RDW: 14.1 % (ref 11.5–15.5)
WBC: 25.1 10*3/uL — AB (ref 4.0–10.5)

## 2018-09-20 LAB — MRSA PCR SCREENING: MRSA BY PCR: POSITIVE — AB

## 2018-09-20 LAB — PREPARE RBC (CROSSMATCH)

## 2018-09-20 MED ORDER — LABETALOL HCL 5 MG/ML IV SOLN
10.0000 mg | INTRAVENOUS | Status: DC | PRN
  Administered 2018-09-20 – 2018-09-21 (×7): 10 mg via INTRAVENOUS
  Filled 2018-09-20 (×8): qty 4

## 2018-09-20 MED ORDER — SODIUM CHLORIDE 0.9% IV SOLUTION
Freq: Once | INTRAVENOUS | Status: AC
  Administered 2018-09-20: 10:00:00 via INTRAVENOUS

## 2018-09-20 MED ORDER — POLYETHYLENE GLYCOL 3350 17 G PO PACK
17.0000 g | PACK | Freq: Every day | ORAL | Status: DC
  Administered 2018-09-20: 17 g via ORAL
  Filled 2018-09-20: qty 1

## 2018-09-20 MED ORDER — MUPIROCIN 2 % EX OINT
1.0000 "application " | TOPICAL_OINTMENT | Freq: Two times a day (BID) | CUTANEOUS | Status: AC
  Administered 2018-09-20 – 2018-09-25 (×8): 1 via NASAL
  Filled 2018-09-20: qty 22

## 2018-09-20 MED ORDER — CHLORHEXIDINE GLUCONATE CLOTH 2 % EX PADS
6.0000 | MEDICATED_PAD | Freq: Every day | CUTANEOUS | Status: AC
  Administered 2018-09-21 – 2018-09-25 (×5): 6 via TOPICAL

## 2018-09-20 NOTE — Progress Notes (Signed)
Pt arrived on floor.   See summary from Internal Medicine teaching service. In brief, 57 yo with ABLA due to retroperitoneal bleed from hemorrhagic mass from L kidney.  Currently planing for nephrectomy on 10/15.  Transferred to [facility] in anticipation of this. She currently c/o unchanged abdominal pain and distension, improved with dilaudid.  Vitals notable for significant hypertension, suspect this is related to pain and likely related to L kidney mass as well. She's in NAD, mildly tachycardic, lungs CTAB without increased WOB, abdomen is distended and mildly diffusely tender, she has trace LEE and palpable LE pulses. Will order labetalol for her pressures. Otherwise she appears stable, follow.

## 2018-09-20 NOTE — Progress Notes (Addendum)
Clinical Course Patient was admitted on 9/28 for acute blood loss anemia due to a spontaneous retroperitoneal bleed and hematoma. Hematoma was noted to be very large on CT imaging. PT and PTT were normal. Urology was consulted and followed throughout admission.  On 10/7 patient showed signs of active bleeding with tachycardia to 130s and unchanged hemoglobin of 6.6 despite 2 units of blood.  IR was consulted for a renal angiogram with embolization however they could not visualize the kidney well and attempt was unsuccessful. Due to acitve bleed Dr. Gloriann Loan (Urology) decided to do a Nephrectomy, scheduled for 10/15.  Overnight patient stabilized and hemoglobin has remained stable between 7-8 since then. Patient has required 7 units of PRBCs due to bleed. Patient has not required a blood transfusion since 10/7.  Per urology request we are transfusing her to a goal of 10. She has received 2 units in the past 2 days for this purpose. Last unit of PRBCs was today, for a total of 9 units of PRBC this admission.  Her clinical course has been complicated by acute renal failure. Nephrology was consulted and patient had a right IJ temporary HD catheter placed 10/4 due to rising creatinine and decreased urine output. However, HD was not needed during admission. With high-dose Lasix patient's urine output increased and her creatinine improved. Her creatinine is currently at baseline of 0.8 and urine output appropriate.   Subjective: Patient was evaluated this morning. Denies any events overnight. Denies shortness of breath. States that her vaginal area feels engorged.  States that her abdominal distention remains unchanged.  Objective:  Vital signs in last 24 hours: Vitals:   09/20/18 0803 09/20/18 0912 09/20/18 0942 09/20/18 1001  BP: (!) 152/98 (!) 167/97 (!) 164/100 (!) 179/99  Pulse: (!) 108 91 92 96  Resp: 17 14 19 16   Temp: 97.8 F (36.6 C) (!) 97.2 F (36.2 C) 97.7 F (36.5 C) 98.1 F (36.7 C)    TempSrc: Oral Axillary Axillary Axillary  SpO2: 91% 98% 99% 97%  Weight: 64 kg     Height:       Physical Exam  Constitutional: She is well-developed, well-nourished, and in no distress.  Cardiovascular: Normal rate, regular rhythm and normal heart sounds. Exam reveals no gallop and no friction rub.  No murmur heard. Pulmonary/Chest: Effort normal and breath sounds normal. No respiratory distress. She has no wheezes. She has no rales.  Abdominal: Soft. Bowel sounds are normal. She exhibits distension. There is no tenderness.  Genitourinary: Vagina normal. No vaginal discharge found.  Genitourinary Comments: Minimal engorgmeent of left labia.  No erythema noted.  No tenderness on exam     Assessment/Plan:  Principal Problem:   Retroperitoneal hematoma Active Problems:   Left renal mass   Sharon Henson hematuria   Acute renal failure (HCC)   Acute blood loss anemia  Acute blood loss anemia secondary to hemorrhagic massive left perinephric hematoma  Patient's vitals have been stable overnight as well as hemoglobin. Hemoglobin is currently 8.6.  Urology would like Hgb higher (10) prior to surgery.  Will give another unit of PRBC today.  Patient is scheduled for nephrectomy on 10/15 at [facility]. Will need to be transferred 10/13 in anticipation of surgery.   -[facility] transfer to stepdown 10/13, Dr. Florene Glen accepted patient today - 1 unit of PRBC  ATN Creatinine is now at baseline. Her urine output has been 2.5 L in the past 24 hours.  Not currently needing diuretics  Will continue to monitor  UOP. Will avoid nephrotoxic agents. -Avoid nephrotoxic agents -strict I's and Os -Bmet in am  Hypertension Blood pressures currently stable. Would be very hesitant and cautious to make any changes in blood pressure at this time.  Will need to be reevaluated if blood pressure greater than 847 systolic.   Dispo: Anticipated discharge to [place] today or tomorrow in anticipation of  nephrectomy 10/15 by urology.   Sharon Henson D'Hanis, DO 09/20/2018, 11:07 AM Pager: [phone]

## 2018-09-20 NOTE — Progress Notes (Signed)
Pt transferred to [facility] ICU room 1224 at this time. SBAR called to [employer], Therapist, sports. Pt transferred via [facility]. VS stable. Pt medicated for 9/10 pain with PRN Dilaudid 2mg . All Pt belongings packed and accompanied Pt.

## 2018-09-21 LAB — BPAM RBC
Blood Product Expiration Date: 201911032359
Blood Product Expiration Date: 201911032359
ISSUE DATE / TIME: 201910130935
ISSUE DATE / TIME: 201910121506
UNIT TYPE AND RH: 6200
Unit Type and Rh: 6200

## 2018-09-21 LAB — TYPE AND SCREEN
ABO/RH(D): A POS
Antibody Screen: NEGATIVE
Unit division: 0
Unit division: 0

## 2018-09-21 LAB — CBC
HCT: 32.9 % — ABNORMAL LOW (ref 36.0–46.0)
HEMOGLOBIN: 10.2 g/dL — AB (ref 12.0–15.0)
MCH: 29.3 pg (ref 26.0–34.0)
MCHC: 31 g/dL (ref 30.0–36.0)
MCV: 94.5 fL (ref 80.0–100.0)
NRBC: 0 % (ref 0.0–0.2)
PLATELETS: 308 10*3/uL (ref 150–400)
RBC: 3.48 MIL/uL — ABNORMAL LOW (ref 3.87–5.11)
RDW: 14.6 % (ref 11.5–15.5)
WBC: 23.6 10*3/uL — ABNORMAL HIGH (ref 4.0–10.5)

## 2018-09-21 LAB — MAGNESIUM: Magnesium: 1.5 mg/dL — ABNORMAL LOW (ref 1.7–2.4)

## 2018-09-21 LAB — ABO/RH: ABO/RH(D): A POS

## 2018-09-21 MED ORDER — METOPROLOL TARTRATE 25 MG PO TABS
12.5000 mg | ORAL_TABLET | Freq: Two times a day (BID) | ORAL | Status: DC
  Administered 2018-09-21 – 2018-09-28 (×15): 12.5 mg via ORAL
  Filled 2018-09-21 (×15): qty 1

## 2018-09-21 MED ORDER — MAGNESIUM SULFATE 2 GM/50ML IV SOLN
2.0000 g | Freq: Once | INTRAVENOUS | Status: AC
  Administered 2018-09-21: 2 g via INTRAVENOUS
  Filled 2018-09-21: qty 50

## 2018-09-21 MED ORDER — SODIUM CHLORIDE 0.9 % IV SOLN
INTRAVENOUS | Status: DC
  Administered 2018-09-21 – 2018-09-23 (×4): via INTRAVENOUS

## 2018-09-21 MED ORDER — HEPARIN SODIUM (PORCINE) 1000 UNIT/ML IJ SOLN
1400.0000 [IU] | Freq: Once | INTRAMUSCULAR | Status: AC
  Administered 2018-09-21: 1400 [IU] via INTRAVENOUS
  Filled 2018-09-21: qty 2

## 2018-09-21 MED ORDER — ORAL CARE MOUTH RINSE
15.0000 mL | Freq: Two times a day (BID) | OROMUCOSAL | Status: DC
  Administered 2018-09-22 – 2018-09-27 (×9): 15 mL via OROMUCOSAL

## 2018-09-21 MED ORDER — HYDROCORTISONE 1 % EX CREA
1.0000 "application " | TOPICAL_CREAM | Freq: Three times a day (TID) | CUTANEOUS | Status: DC | PRN
  Administered 2018-09-23: 1 via TOPICAL
  Filled 2018-09-21: qty 28

## 2018-09-21 NOTE — Progress Notes (Signed)
[facility] PROGRESS NOTE  Sharon Henson VOH:[id number] DOB: March 31, 1961 DOA: 09/05/2018 PCP: Sharon Landsman, MD   Narrative: 57 year old female Prior emergent C-section vertical incision for 6948 complicated by diastases recti umbilical hernia status post repair at that time On no meds at home 35-year smoking history Patient admitted by internal medicine teaching service 08/28/2018 with large spontaneous retroperitoneal hematoma and bleed with a hemoglobin of 6.9 complicated by AKI-neurology consulted on admission she was transiently on ceftriaxone for possible UTI-found to have a metabolic gap acidosis and moderate uremia, hyponatremia--sodium 126 bicarb 19 creatinine 3.2 Nephrology saw the patient in consult felt this was ATN from acute blood loss anemia and more importantly she had orthostasis Had active bleeding and Dr. Gloriann Henson of urology decided to schedule the nephrectomy 10/15 with a preop hemoglobin preferably above 10-patient was transferred to [facility] and will be undergoing nephrectomy Note right IJ was placed 10/4 per urology    A & Plan Gross hematuria despite a total of May be 9 units of blood-unsuccessful IR embolization-nephrectomy scheduled-defer management to Dr. Gloriann Henson Resolved hypovolemic shock with orthostasis, hypotension-resolved after transfusions--hemoglobin stable ATN-BUN/creatinine is dropped 22/0.8 magnesium is low replace IV today HTN started on labetalol injection 10 mg every 2 as needed-I still elevated will add metoprolol 12.5 now Mild obesity Prior history of vertical C-section Rash to upper abdomen--?allergy to tape?  Monitor in am    DVT prophylaxis: scd  Code Status: f   Family Communication: none   Disposition Plan: ip    Sharon Au, MD  [facility] Direct contact: [phone] --Via amion app OR  --[email or web address]; password [email or web address]  7PM-7AM contact night coverage as above 09/21/2018, 7:39 AM  LOS: 16 days    Consultants:  Urology   Procedures:  n  Antimicrobials:  n  Interval history/Subjective: Awake alert tired irritable In nad  Objective:  Vitals:  Vitals:   09/21/18 0500 09/21/18 0600  BP: (!) 186/99 (!) 168/83  Pulse: 88 70  Resp: (!) 21 15  Temp:    SpO2: 100% 95%    Exam:  . Awake alert in nad . Ct ab . abd soft tender mid quadrants--CVa is tender-foley drainage clear . Neuro intact and moves 4 limbs . Psych flat tired appearing . nole edema . Has rash to upper abdomne   I have personally reviewed the following:   Labs:  Hemoglobin up from 8-->10.2  Bun creat doen from 23/1.13--->22/0.82  MAg 1.5  Imaging studies:  n  Medical tests:  n   Test discussed with performing physician:  n  Decision to obtain old records:  n  Review and summation of old records:  n  Scheduled Meds: . sodium chloride   Intravenous Once  . Chlorhexidine Gluconate Cloth  6 each Topical Q0600  . feeding supplement (ENSURE ENLIVE)  237 mL Oral BID BM  . mouth rinse  15 mL Mouth Rinse BID  . multivitamin with minerals  1 tablet Oral Daily  . mupirocin ointment  1 application Nasal BID  . polyethylene glycol  17 g Oral Daily   Continuous Infusions: . sodium chloride Stopped (09/20/18 1900)    Principal Problem:   Retroperitoneal hematoma Active Problems:   Left renal mass   Pilar Plate hematuria   Acute renal failure (HCC)   Acute blood loss anemia   LOS: 16 days

## 2018-09-21 NOTE — Care Management Note (Addendum)
Case Management Note  Patient Details  Name: Sharon Henson MRN: [id number] Date of Birth: 08-Jan-1961  Subjective/Objective:                  abla due to acute retroperitoneal hemorrhage from maSS ON THE LEFT KINDNEY/wbc 23.6, hgb dropped to 10.2-onme unit of prbc's, mag -1.5-Iv mgso4/  Action/Plan: Following for progression and cm needs Lives alone has adult children and family for support Expected Discharge Date:                  Expected Discharge Plan:  Home/Self Care  In-House Referral:     Discharge planning Services  CM Consult  Post Acute Care Choice:    Choice offered to:     DME Arranged:    DME Agency:     HH Arranged:    HH Agency:     Status of Service:  In process, will continue to follow  If discussed at Long Length of Stay Meetings, dates discussed:    Additional Comments:  Leeroy Cha, RN 09/21/2018, 8:38 AM

## 2018-09-21 NOTE — Progress Notes (Signed)
Nutrition Follow-up  DOCUMENTATION CODES:   Not applicable  INTERVENTION:  - Continue Ensure Enlive BID.  - Continue to encourage PO intakes.    NUTRITION DIAGNOSIS:   Inadequate oral intake related to poor appetite, acute illness as evidenced by per patient/family report, estimated needs. -ongoing, improving  GOAL:   Patient will meet greater than or equal to 90% of their needs -unmet on average  MONITOR:   PO intake, Supplement acceptance, Weight trends, Labs  ASSESSMENT:   57 yo female, admitted with acute blood loss anemia d/t spontaneous retroperitoneal bleed and hematoma, complicated by acute renal failure. PMHx includes similar symptoms one year ago (suspected renal stone, symptoms resolved on their own), smoker.  Meal completion has been ~50% over the past few days. Patient reports ongoing decreased appetite but that diarrhea has resolved and that abdominal discomfort is less than it was last week. Patient has been accepting nearly all bottles of Ensure provided.   Per Dr. Arlyss Queen note from this AM: plan is for nephrectomy. Weight was trending up earlier in admission but is now trending back down toward admission weight.   Medications reviewed; 2 g IV Mg sulfate x1 run today, daily multivitamin with minerals, 1 packet Miralax/day. Labs reviewed; Cl: 96 mmol/L, BUN: 22 mg/dL, Ca: 8.5 mg/dL, Mg: 1.5 mg/dL.    Diet Order:   Diet Order            Diet regular Room service appropriate? Yes; Fluid consistency: Thin; Fluid restriction: 1200 mL Fluid  Diet effective now              EDUCATION NEEDS:   No education needs have been identified at this time  Skin:  Skin Assessment: Reviewed RN Assessment  Last BM:  10/13  Height:   Ht Readings from Last 1 Encounters:  09/20/18 5' (1.524 m)    Weight:   Wt Readings from Last 1 Encounters:  09/21/18 61.5 kg    Ideal Body Weight:  51.3 kg  BMI:  Body mass index is 26.48 kg/m.  Estimated Nutritional  Needs:   Kcal:  3614 - 4315 calories/day(25-30 kcal/kg IBW)  Protein:  51-67 g protein/day(1-1.3 g/kg IBW)  Fluid:  1 mL/kcal     Jarome Matin, MS, RD, LDN, CNSC Inpatient Clinical Dietitian Pager # [phone] After hours/weekend pager # [phone]

## 2018-09-22 ENCOUNTER — Inpatient Hospital Stay (HOSPITAL_COMMUNITY): Payer: BLUE CROSS/BLUE SHIELD | Admitting: Certified Registered Nurse Anesthetist

## 2018-09-22 ENCOUNTER — Ambulatory Visit (HOSPITAL_COMMUNITY): Admission: RE | Admit: 2018-09-22 | Payer: BLUE CROSS/BLUE SHIELD | Source: Ambulatory Visit | Admitting: Urology

## 2018-09-22 ENCOUNTER — Encounter (HOSPITAL_COMMUNITY)
Admission: EM | Disposition: A | Discharge status: Home / Self Care | Payer: Self-pay | Source: Home / Self Care | Attending: Oncology

## 2018-09-22 ENCOUNTER — Inpatient Hospital Stay (HOSPITAL_COMMUNITY): Payer: BLUE CROSS/BLUE SHIELD

## 2018-09-22 ENCOUNTER — Encounter (HOSPITAL_COMMUNITY): Payer: Self-pay | Admitting: *Deleted

## 2018-09-22 DIAGNOSIS — C642 Malignant neoplasm of left kidney, except renal pelvis: Secondary | ICD-10-CM | POA: Diagnosis not present

## 2018-09-22 HISTORY — PX: NEPHRECTOMY: SHX65

## 2018-09-22 LAB — BASIC METABOLIC PANEL
ANION GAP: 10 (ref 5–15)
ANION GAP: 7 (ref 5–15)
BUN: 15 mg/dL (ref 6–20)
BUN: 16 mg/dL (ref 6–20)
CHLORIDE: 101 mmol/L (ref 98–111)
CO2: 36 mmol/L — ABNORMAL HIGH (ref 22–32)
CO2: 31 mmol/L (ref 22–32)
Calcium: 9 mg/dL (ref 8.9–10.3)
Calcium: 7.7 mg/dL — ABNORMAL LOW (ref 8.9–10.3)
Chloride: 96 mmol/L — ABNORMAL LOW (ref 98–111)
Creatinine, Ser: 0.69 mg/dL (ref 0.44–1.00)
Creatinine, Ser: 1.02 mg/dL — ABNORMAL HIGH (ref 0.44–1.00)
GFR calc Af Amer: >60 mL/min (ref >60)
GFR calc Af Amer: >60 mL/min (ref >60)
GFR calc non Af Amer: >60 mL/min (ref >60)
GFR, EST NON AFRICAN AMERICAN: 59 mL/min — AB (ref >60)
GLUCOSE: 120 mg/dL — AB (ref 70–99)
Glucose, Bld: 257 mg/dL — ABNORMAL HIGH (ref 70–99)
POTASSIUM: 3.9 mmol/L (ref 3.5–5.1)
POTASSIUM: 4.2 mmol/L (ref 3.5–5.1)
SODIUM: 139 mmol/L (ref 135–145)
Sodium: 142 mmol/L (ref 135–145)

## 2018-09-22 LAB — CBC WITH DIFFERENTIAL/PLATELET
ABS IMMATURE GRANULOCYTES: 0.96 10*3/uL — AB (ref 0.00–0.07)
Basophils Absolute: 0.1 10*3/uL (ref 0.0–0.1)
Basophils Relative: 1 %
Eosinophils Absolute: 0.3 10*3/uL (ref 0.0–0.5)
Eosinophils Relative: 2 %
HEMATOCRIT: 33.1 % — AB (ref 36.0–46.0)
HEMOGLOBIN: 10 g/dL — AB (ref 12.0–15.0)
IMMATURE GRANULOCYTES: 5 %
Lymphocytes Relative: 8 %
Lymphs Abs: 1.5 10*3/uL (ref 0.7–4.0)
MCH: 28.7 pg (ref 26.0–34.0)
MCHC: 30.2 g/dL (ref 30.0–36.0)
MCV: 95.1 fL (ref 80.0–100.0)
MONO ABS: 1.3 10*3/uL — AB (ref 0.1–1.0)
MONOS PCT: 7 %
NEUTROS ABS: 14.6 10*3/uL — AB (ref 1.7–7.7)
NEUTROS PCT: 77 %
PLATELETS: 308 10*3/uL (ref 150–400)
RBC: 3.48 MIL/uL — ABNORMAL LOW (ref 3.87–5.11)
RDW: 14.9 % (ref 11.5–15.5)
WBC: 18.8 10*3/uL — ABNORMAL HIGH (ref 4.0–10.5)
nRBC: 0 % (ref 0.0–0.2)

## 2018-09-22 LAB — POCT I-STAT 4, (NA,K, GLUC, HGB,HCT)
Glucose, Bld: 120 mg/dL — ABNORMAL HIGH (ref 70–99)
HCT: 23 % — ABNORMAL LOW (ref 36.0–46.0)
Hemoglobin: 7.8 g/dL — ABNORMAL LOW (ref 12.0–15.0)
POTASSIUM: 3.7 mmol/L (ref 3.5–5.1)
SODIUM: 138 mmol/L (ref 135–145)

## 2018-09-22 LAB — PREPARE RBC (CROSSMATCH)

## 2018-09-22 SURGERY — NEPHRECTOMY
Anesthesia: General | Laterality: Left

## 2018-09-22 MED ORDER — SUGAMMADEX SODIUM 200 MG/2ML IV SOLN
INTRAVENOUS | Status: DC | PRN
  Administered 2018-09-22: 200 mg via INTRAVENOUS

## 2018-09-22 MED ORDER — PROPOFOL 10 MG/ML IV BOLUS
INTRAVENOUS | Status: AC
  Filled 2018-09-22: qty 40

## 2018-09-22 MED ORDER — APREPITANT 40 MG PO CAPS
40.0000 mg | ORAL_CAPSULE | Freq: Once | ORAL | Status: AC
  Administered 2018-09-22: 40 mg via ORAL

## 2018-09-22 MED ORDER — ROCURONIUM BROMIDE 100 MG/10ML IV SOLN
INTRAVENOUS | Status: AC
  Filled 2018-09-22: qty 1

## 2018-09-22 MED ORDER — SCOPOLAMINE 1 MG/3DAYS TD PT72
1.0000 | MEDICATED_PATCH | TRANSDERMAL | Status: DC
  Administered 2018-09-22: 1.5 mg via TRANSDERMAL

## 2018-09-22 MED ORDER — ROCURONIUM BROMIDE 50 MG/5ML IV SOSY
PREFILLED_SYRINGE | INTRAVENOUS | Status: DC | PRN
  Administered 2018-09-22: 50 mg via INTRAVENOUS
  Administered 2018-09-22 (×2): 10 mg via INTRAVENOUS

## 2018-09-22 MED ORDER — KETAMINE HCL 10 MG/ML IJ SOLN
INTRAMUSCULAR | Status: DC | PRN
  Administered 2018-09-22: 30 mg via INTRAVENOUS

## 2018-09-22 MED ORDER — ONDANSETRON HCL 4 MG/2ML IJ SOLN: 4.0000 mg | Freq: Four times a day (QID) | INTRAMUSCULAR | Status: DC | PRN

## 2018-09-22 MED ORDER — HYDROMORPHONE HCL 1 MG/ML IJ SOLN
INTRAMUSCULAR | Status: AC
  Filled 2018-09-22: qty 2

## 2018-09-22 MED ORDER — SUCCINYLCHOLINE CHLORIDE 200 MG/10ML IV SOSY
PREFILLED_SYRINGE | INTRAVENOUS | Status: DC | PRN
  Administered 2018-09-22: 120 mg via INTRAVENOUS

## 2018-09-22 MED ORDER — MIDAZOLAM HCL 5 MG/5ML IJ SOLN
INTRAMUSCULAR | Status: DC | PRN
  Administered 2018-09-22 (×2): 1 mg via INTRAVENOUS

## 2018-09-22 MED ORDER — CEFAZOLIN SODIUM-DEXTROSE 2-4 GM/100ML-% IV SOLN
2.0000 g | Freq: Three times a day (TID) | INTRAVENOUS | Status: AC
  Administered 2018-09-22 – 2018-09-23 (×2): 2 g via INTRAVENOUS
  Filled 2018-09-22 (×2): qty 100

## 2018-09-22 MED ORDER — ALBUMIN HUMAN 5 % IV SOLN
INTRAVENOUS | Status: DC | PRN
  Administered 2018-09-22 (×2): via INTRAVENOUS

## 2018-09-22 MED ORDER — FENTANYL CITRATE (PF) 100 MCG/2ML IJ SOLN
INTRAMUSCULAR | Status: DC | PRN
  Administered 2018-09-22 (×3): 25 ug via INTRAVENOUS
  Administered 2018-09-22 (×2): 50 ug via INTRAVENOUS
  Administered 2018-09-22 (×3): 25 ug via INTRAVENOUS

## 2018-09-22 MED ORDER — PROPOFOL 10 MG/ML IV BOLUS
INTRAVENOUS | Status: DC | PRN
  Administered 2018-09-22: 100 mg via INTRAVENOUS

## 2018-09-22 MED ORDER — DEXAMETHASONE SODIUM PHOSPHATE 10 MG/ML IJ SOLN
INTRAMUSCULAR | Status: AC
  Filled 2018-09-22: qty 1

## 2018-09-22 MED ORDER — LIDOCAINE 2% (20 MG/ML) 5 ML SYRINGE
INTRAMUSCULAR | Status: DC | PRN
  Administered 2018-09-22: 100 mg via INTRAVENOUS

## 2018-09-22 MED ORDER — PHENYLEPHRINE HCL 10 MG/ML IJ SOLN
INTRAVENOUS | Status: DC | PRN
  Administered 2018-09-22: 25 ug/min via INTRAVENOUS

## 2018-09-22 MED ORDER — SODIUM CHLORIDE 0.9% IV SOLUTION: Freq: Once | INTRAVENOUS | Status: DC

## 2018-09-22 MED ORDER — DIPHENHYDRAMINE HCL 12.5 MG/5ML PO ELIX: 12.5000 mg | ORAL_SOLUTION | Freq: Four times a day (QID) | ORAL | Status: DC | PRN

## 2018-09-22 MED ORDER — BUPIVACAINE LIPOSOME 1.3 % IJ SUSP
20.0000 mL | Freq: Once | INTRAMUSCULAR | Status: AC
  Administered 2018-09-22: 20 mL
  Filled 2018-09-22: qty 20

## 2018-09-22 MED ORDER — LACTATED RINGERS IV SOLN
INTRAVENOUS | Status: DC
  Administered 2018-09-22 (×2): via INTRAVENOUS

## 2018-09-22 MED ORDER — 0.9 % SODIUM CHLORIDE (POUR BTL) OPTIME
TOPICAL | Status: DC | PRN
  Administered 2018-09-22: 2000 mL

## 2018-09-22 MED ORDER — HYDROMORPHONE HCL 1 MG/ML IJ SOLN
0.2500 mg | INTRAMUSCULAR | Status: DC | PRN
  Administered 2018-09-22 (×2): 0.5 mg via INTRAVENOUS

## 2018-09-22 MED ORDER — MIDAZOLAM HCL 2 MG/2ML IJ SOLN
INTRAMUSCULAR | Status: AC
  Filled 2018-09-22: qty 2

## 2018-09-22 MED ORDER — ONDANSETRON HCL 4 MG/2ML IJ SOLN
INTRAMUSCULAR | Status: DC | PRN
  Administered 2018-09-22: 4 mg via INTRAVENOUS

## 2018-09-22 MED ORDER — HYDROMORPHONE HCL 1 MG/ML IJ SOLN
INTRAMUSCULAR | Status: AC
  Filled 2018-09-22: qty 1

## 2018-09-22 MED ORDER — PHENYLEPHRINE 40 MCG/ML (10ML) SYRINGE FOR IV PUSH (FOR BLOOD PRESSURE SUPPORT)
PREFILLED_SYRINGE | INTRAVENOUS | Status: DC | PRN
  Administered 2018-09-22 (×7): 80 ug via INTRAVENOUS

## 2018-09-22 MED ORDER — ONDANSETRON HCL 4 MG/2ML IJ SOLN
INTRAMUSCULAR | Status: AC
  Filled 2018-09-22: qty 2

## 2018-09-22 MED ORDER — SODIUM CHLORIDE 0.9 % IV SOLN
INTRAVENOUS | Status: DC | PRN
  Administered 2018-09-22: 13:00:00 via INTRAVENOUS

## 2018-09-22 MED ORDER — EVICEL 5 ML EX KIT
PACK | Freq: Once | CUTANEOUS | Status: DC
  Filled 2018-09-22: qty 1

## 2018-09-22 MED ORDER — SUCCINYLCHOLINE CHLORIDE 200 MG/10ML IV SOSY
PREFILLED_SYRINGE | INTRAVENOUS | Status: AC
  Filled 2018-09-22: qty 10

## 2018-09-22 MED ORDER — SUGAMMADEX SODIUM 200 MG/2ML IV SOLN
INTRAVENOUS | Status: AC
  Filled 2018-09-22: qty 2

## 2018-09-22 MED ORDER — DIPHENHYDRAMINE HCL 50 MG/ML IJ SOLN: 12.5000 mg | Freq: Four times a day (QID) | INTRAMUSCULAR | Status: DC | PRN

## 2018-09-22 MED ORDER — PROPOFOL 10 MG/ML IV BOLUS
INTRAVENOUS | Status: AC
  Filled 2018-09-22: qty 20

## 2018-09-22 MED ORDER — PROPOFOL 500 MG/50ML IV EMUL
INTRAVENOUS | Status: DC | PRN
  Administered 2018-09-22: 75 ug/kg/min via INTRAVENOUS

## 2018-09-22 MED ORDER — FENTANYL CITRATE (PF) 250 MCG/5ML IJ SOLN
INTRAMUSCULAR | Status: AC
  Filled 2018-09-22: qty 5

## 2018-09-22 MED ORDER — LACTATED RINGERS IV SOLN
INTRAVENOUS | Status: DC | PRN
  Administered 2018-09-22 (×2): via INTRAVENOUS

## 2018-09-22 MED ORDER — SODIUM CHLORIDE 0.9 % IJ SOLN
INTRAMUSCULAR | Status: DC | PRN
  Administered 2018-09-22: 20 mL

## 2018-09-22 MED ORDER — DEXAMETHASONE SODIUM PHOSPHATE 10 MG/ML IJ SOLN
INTRAMUSCULAR | Status: DC | PRN
  Administered 2018-09-22: 10 mg via INTRAVENOUS

## 2018-09-22 MED ORDER — SODIUM CHLORIDE 0.9% FLUSH: 9.0000 mL | INTRAVENOUS | Status: DC | PRN

## 2018-09-22 MED ORDER — NALOXONE HCL 0.4 MG/ML IJ SOLN: 0.4000 mg | INTRAMUSCULAR | Status: DC | PRN

## 2018-09-22 MED ORDER — CEFAZOLIN SODIUM-DEXTROSE 2-4 GM/100ML-% IV SOLN
2.0000 g | INTRAVENOUS | Status: AC
  Administered 2018-09-22: 2 g via INTRAVENOUS

## 2018-09-22 MED ORDER — LIDOCAINE 2% (20 MG/ML) 5 ML SYRINGE
INTRAMUSCULAR | Status: AC
  Filled 2018-09-22: qty 5

## 2018-09-22 MED ORDER — VASOPRESSIN 20 UNIT/ML IV SOLN
INTRAVENOUS | Status: DC | PRN
  Administered 2018-09-22 (×2): 1 [IU] via INTRAVENOUS

## 2018-09-22 MED ORDER — SODIUM CHLORIDE 0.9 % IJ SOLN
INTRAMUSCULAR | Status: AC
  Filled 2018-09-22: qty 20

## 2018-09-22 MED ORDER — HYDROMORPHONE 1 MG/ML IV SOLN
INTRAVENOUS | Status: DC
  Administered 2018-09-22: 16:00:00 via INTRAVENOUS
  Administered 2018-09-22: 1.5 mL via INTRAVENOUS
  Administered 2018-09-22: 1.5 mg via INTRAVENOUS
  Administered 2018-09-22: 2.1 mg via INTRAVENOUS
  Filled 2018-09-22: qty 25

## 2018-09-22 MED ORDER — HYDROMORPHONE HCL 1 MG/ML IJ SOLN
0.2500 mg | INTRAMUSCULAR | Status: DC | PRN
  Administered 2018-09-22 (×4): 0.5 mg via INTRAVENOUS

## 2018-09-22 MED ORDER — HYDROMORPHONE HCL 1 MG/ML IJ SOLN
0.5000 mg | Freq: Once | INTRAMUSCULAR | Status: AC
  Administered 2018-09-22: 0.5 mg via INTRAVENOUS
  Filled 2018-09-22: qty 1

## 2018-09-22 SURGICAL SUPPLY — 56 items
AGENT HMST KT MTR STRL THRMB (HEMOSTASIS) ×1
ATTRACTOMAT 16X20 MAGNETIC DRP (DRAPES) ×1 IMPLANT
BLADE EXTENDED COATED 6.5IN (ELECTRODE) ×3 IMPLANT
BLADE HEX COATED 2.75 (ELECTRODE) ×3 IMPLANT
CATH FOLEY 2WAY SLVR  5CC 16FR (CATHETERS)
CATH FOLEY 2WAY SLVR 5CC 16FR (CATHETERS) ×1 IMPLANT
CHLORAPREP W/TINT 26ML (MISCELLANEOUS) ×3 IMPLANT
CLIP VESOLOCK LG 6/CT PURPLE (CLIP) ×8 IMPLANT
CLIP VESOLOCK MED LG 6/CT (CLIP) ×8 IMPLANT
COVER BACK TABLE 60X90IN (DRAPES) ×3 IMPLANT
COVER SURGICAL LIGHT HANDLE (MISCELLANEOUS) ×3 IMPLANT
COVER WAND RF STERILE (DRAPES) ×2 IMPLANT
DECANTER SPIKE VIAL GLASS SM (MISCELLANEOUS) ×2 IMPLANT
DISSECTOR ROUND CHERRY 3/8 STR (MISCELLANEOUS) ×3 IMPLANT
DRAIN CHANNEL 15F RND FF 3/16 (WOUND CARE) ×1 IMPLANT
DRAIN PENROSE 18X1/2 LTX STRL (DRAIN) ×2 IMPLANT
DRAPE INCISE IOBAN 66X45 STRL (DRAPES) ×3 IMPLANT
DRAPE LAPAROSCOPIC ABDOMINAL (DRAPES) ×2 IMPLANT
DRAPE LAPAROTOMY TRNSV 102X78 (DRAPE) IMPLANT
DRAPE WARM FLUID 44X44 (DRAPE) ×3 IMPLANT
DRSG PAD ABDOMINAL 8X10 ST (GAUZE/BANDAGES/DRESSINGS) ×4 IMPLANT
ELECT REM PT RETURN 15FT ADLT (MISCELLANEOUS) ×3 IMPLANT
EVACUATOR SILICONE 100CC (DRAIN) ×3 IMPLANT
GAUZE 4X4 16PLY RFD (DISPOSABLE) ×1 IMPLANT
GAUZE SPONGE 4X4 12PLY STRL (GAUZE/BANDAGES/DRESSINGS) ×2 IMPLANT
GLOVE BIO SURGEON STRL SZ7.5 (GLOVE) ×3 IMPLANT
HEMOSTAT SURGICEL 4X8 (HEMOSTASIS) ×2 IMPLANT
KIT BASIN OR (CUSTOM PROCEDURE TRAY) ×3 IMPLANT
LOOP VESSEL MAXI BLUE (MISCELLANEOUS) ×3 IMPLANT
PACK GENERAL/GYN (CUSTOM PROCEDURE TRAY) ×3 IMPLANT
POSITIONER SURGICAL ARM (MISCELLANEOUS) ×2 IMPLANT
RELOAD 45 VASCULAR/THIN (ENDOMECHANICALS) ×12 IMPLANT
RELOAD STAPLE 45 2.5 WHT GRN (ENDOMECHANICALS) IMPLANT
SPONGE LAP 18X18 RF (DISPOSABLE) ×22 IMPLANT
SPONGE SURGIFOAM ABS GEL 100 (HEMOSTASIS) IMPLANT
STAPLER VISISTAT 35W (STAPLE) ×3 IMPLANT
SURGIFLO W/THROMBIN 8M KIT (HEMOSTASIS) ×3 IMPLANT
SUT ETHILON 3 0 PS 1 (SUTURE) ×2 IMPLANT
SUT MON AB 2-0 SH 27 (SUTURE) ×6
SUT MON AB 2-0 SH27 (SUTURE) ×2 IMPLANT
SUT PDS AB 1 TP1 96 (SUTURE) ×6 IMPLANT
SUT SILK 0 (SUTURE) ×3
SUT SILK 0 30XBRD TIE 6 (SUTURE) ×1 IMPLANT
SUT SILK 2 0 (SUTURE) ×3
SUT SILK 2 0 SH CR/8 (SUTURE) ×2 IMPLANT
SUT SILK 2-0 30XBRD TIE 12 (SUTURE) ×1 IMPLANT
SUT VIC AB 2-0 SH 27 (SUTURE) ×12
SUT VIC AB 2-0 SH 27X BRD (SUTURE) ×4 IMPLANT
SUT VIC AB 4-0 RB1 27 (SUTURE) ×12
SUT VIC AB 4-0 RB1 27XBRD (SUTURE) ×4 IMPLANT
TAPE UMBILICAL COTTON 1/8X30 (MISCELLANEOUS) ×3 IMPLANT
TOWEL OR 17X26 10 PK STRL BLUE (TOWEL DISPOSABLE) ×6 IMPLANT
TOWEL OR NON WOVEN STRL DISP B (DISPOSABLE) ×3 IMPLANT
TUBING CONNECTING 10 (TUBING) ×2 IMPLANT
TUBING CONNECTING 10' (TUBING) ×1
URINEMETER 200ML W/220 (MISCELLANEOUS) ×1 IMPLANT

## 2018-09-22 NOTE — Anesthesia Procedure Notes (Signed)
Procedure Name: Intubation Date/Time: 09/22/2018 11:59 AM Performed by: Maxwell Caul, CRNA Pre-anesthesia Checklist: Patient identified, Emergency Drugs available, Suction available and Patient being monitored Patient Re-evaluated:Patient Re-evaluated prior to induction Oxygen Delivery Method: Circle system utilized Preoxygenation: Pre-oxygenation with 100% oxygen Induction Type: IV induction Ventilation: Mask ventilation without difficulty Laryngoscope Size: Mac and 3 Grade View: Grade I Tube type: Oral Tube size: 7.5 mm Number of attempts: 1 Airway Equipment and Method: Stylet and Oral airway Placement Confirmation: ETT inserted through vocal cords under direct vision,  positive ETCO2 and breath sounds checked- equal and bilateral Secured at: 21 cm Tube secured with: Tape Dental Injury: Teeth and Oropharynx as per pre-operative assessment

## 2018-09-22 NOTE — Progress Notes (Addendum)
[facility] PROGRESS NOTE  Sharon Henson:[id number] DOB: 03-23-1961 DOA: 09/05/2018 PCP: Lin Landsman, MD   Narrative: 57 year old female Prior emergent C-section vertical incision for 3762 complicated by diastases recti umbilical hernia status post repair at that time On no meds at home 35-year smoking history Patient admitted by internal medicine teaching service 08/28/2018 with large spontaneous retroperitoneal hematoma and bleed with a hemoglobin of 6.9 complicated by AKI-neurology consulted on admission she was transiently on ceftriaxone for possible UTI-found to have a metabolic gap acidosis and moderate uremia, hyponatremia--sodium 126 bicarb 19 creatinine 3.2 Nephrology saw the patient in consult felt this was ATN from acute blood loss anemia and more importantly she had orthostasis Had active bleeding and Dr. Gloriann Loan of urology decided to schedule the nephrectomy 10/15 with a preop hemoglobin preferably above 10-patient was transferred to [facility] and will be undergoing nephrectomy Note right IJ was placed 10/4 per urology    A & Plan Gross hematuria despite a total of May be 9 units of blood-unsuccessful IR embolization-nephrectomy scheduled--received 3 u PRBC intra-op If hemodynamically stable, Dr. Gloriann Loan has indicated he will take over as attending--We will follow for medical concerns as prn Patient is on PCA dilaudid post-op  Resolved hypovolemic shock with orthostasis, hypotension-resolved after transfusions--recheck am labs  ATN-BUN/creatinine is dropped 22/0.8 magnesium is low replace IV today  HTN started on labetalol injection 10 mg every 2 as needed-still elevated will add metoprolol 12.5 now  Mild obesity Prior history of vertical C-section Rash to upper abdomen--?allergy to tape?  Monitor in am    DVT prophylaxis: scd  Code Status: f   Family Communication: none   Disposition Plan: ip    [name], MD  [facility] Direct contact:  [phone] --Via amion app OR  --[email or web address]; password [email or web address]  7PM-7AM contact night coverage as above 09/22/2018, 4:05 PM  LOS: 17 days   Consultants:  Urology   Procedures:  n  Antimicrobials:  n  Interval history/Subjective:  Awake and seen early int he day Needing dilaudid for pain No cp no fever no chills  Objective:  Vitals:  Vitals:   09/22/18 1545 09/22/18 1554  BP: 125/86   Pulse: 86   Resp: 15 16  Temp: 98 F (36.7 C)   SpO2: 100% 100%    Exam:  . Awake alert in nad . Ct ab    I have personally reviewed the following:   Labs:  Hemoglobin up from 8-->10.2-->7.8 intra-op  Bun creat doen from 23/1.13--->22/0.82-->15/0.69  MAg 1.5  Imaging studies:  n  Medical tests:  n   Test discussed with performing physician:  n  Decision to obtain old records:  n  Review and summation of old records:  n  Scheduled Meds: . [MAR Hold] sodium chloride   Intravenous Once  . sodium chloride   Intravenous Once  . [MAR Hold] Chlorhexidine Gluconate Cloth  6 each Topical Q0600  . [MAR Hold] feeding supplement (ENSURE ENLIVE)  237 mL Oral BID BM  . fibrin sealant component   Other Once  . HYDROmorphone      . HYDROmorphone      . HYDROmorphone   Intravenous Q4H  . [MAR Hold] mouth rinse  15 mL Mouth Rinse BID  . [MAR Hold] metoprolol tartrate  12.5 mg Oral BID  . [MAR Hold] multivitamin with minerals  1 tablet Oral Daily  . [MAR Hold] mupirocin ointment  1 application Nasal BID  . [MAR Hold] polyethylene glycol  17 g Oral Daily  .  scopolamine  1 patch Transdermal Q72H   Continuous Infusions: . [MAR Hold] sodium chloride 10 mL/hr at 09/21/18 1500  . sodium chloride 100 mL/hr at 09/22/18 0758  . lactated ringers 75 mL/hr at 09/22/18 1048    Principal Problem:   Retroperitoneal hematoma Active Problems:   Left renal mass   Pilar Plate hematuria   Acute renal failure (HCC)   Acute blood loss anemia   LOS: 17 days

## 2018-09-22 NOTE — Transfer of Care (Signed)
Immediate Anesthesia Transfer of Care Note  Patient: Sharon Henson  Procedure(s) Performed: NEPHRECTOMY (Left )  Patient Location: PACU  Anesthesia Type:General  Level of Consciousness: awake, alert  and oriented  Airway & Oxygen Therapy: Patient Spontanous Breathing and Patient connected to face mask oxygen  Post-op Assessment: Report given to RN and Post -op Vital signs reviewed and stable  Post vital signs: Reviewed and stable  Last Vitals:  Vitals Value Taken Time  BP    Temp    Pulse    Resp    SpO2      Last Pain:  Vitals:   09/22/18 1045  TempSrc: Oral  PainSc: 0-No pain      Patients Stated Pain Goal: 5 (81/27/51 7001)  Complications: No apparent anesthesia complications

## 2018-09-22 NOTE — Interval H&P Note (Signed)
History and Physical Interval Note:  09/22/2018 10:47 AM  Sharon Henson  has presented today for surgery, with the diagnosis of LEFT RENAL MASS  The various methods of treatment have been discussed with the patient and family. After consideration of risks, benefits and other options for treatment, the patient has consented to  Procedure(s) with comments: NEPHRECTOMY (Left) - 3 HRS as a surgical intervention .  The patient's history has been reviewed, patient examined, no change in status, stable for surgery.  I have reviewed the patient's chart and labs.  Questions were answered to the patient's satisfaction.   . In the interval, the patient had a repeat bleed.  Embolization was attempted and was unsuccessful.  The left renal artery could not be fully identified.  She has had a repeat CT scan which identified the kidney lower in the pelvis compressed by the hematoma.  I have had a full discussion with the patient regarding the risk including but not limited to bleeding including potential life-threatening bleeding that could lead to death, infection, injury to surrounding structures such as bowel or spleen or pancreas among others.  And need for additional procedures.  Understanding all risks, the patient has elected to proceed.   Marton Redwood, III

## 2018-09-22 NOTE — Op Note (Addendum)
Operative Note  Preoperative diagnosis:  1.  Left renal mass with resultant left retroperitoneal hematoma  Postoperative diagnosis: 1.  Left renal mass with resultant left retroperitoneal hematoma  Procedure(s): 1.  Open left radical nephrectomy  Surgeon: Link Snuffer, MD  Assistants: Alexis Frock, MD-- an assistant was necessary due to the complicated nature of the case and the high risk nature of the case in which two experience surgeons were needed to safely perform the operation.  Anesthesia: General  Complications: None immediate  EBL: 1 L  Specimens: 1.  Left kidney  Drains/Catheters: 1.  Foley catheter, JP drain  Intraoperative findings: Large left renal mass removed en bloc with the adrenal.  It was quite adherent to the posterior portion of the peritoneum  Indication: 57 year old female recently admitted for left retroperitoneal bleed.  She initially recovered well with conservative management however she had a repeat bleed which prompted attempt at embolization.  This was unable to be completed.  Due to continued pain and the risk of rebleed, the decision was made to proceed with left radical nephrectomy.  Description of procedure:  The patient was identified and consent was obtained.  The patient was taken to the operating room and placed in the supine position.  The patient was placed under general anesthesia.  Perioperative antibiotics were administered.  Patient was prepped and draped in a standard sterile fashion and a timeout was performed.  A large midline incision was made from the xiphoid down to just above the pubic symphysis.  This was carried down with Bovie electrocautery through the anterior rectus sheath.  The musculature was bluntly divided and the posterior peritoneum was sharply entered.  I then opened the entire cavity with Bovie electrocautery taking care not to injure the bowel.  The large left renal mass was immediately evident.  The line of Toldt  was incised and the colon was retracted medially.  During this process, some of the bowel mesentery was divided.  However, at no point during the case was there any evidence of ischemia to the bowel. There Appeared to be good blood flow to the entire bowel.  Once the colon was retracted medially, the Bookwalter retractor was used to provide visualization of the tumor.  We were able to expose the renal vein as well as the underlying aorta.  We were able to create a window behind the renal vein and this was subsequently stapled with a vascular staple load.  We then created another window behind the renal artery which was also taken with a vascular staple load.  We inspected the vascular stumps and there was no evidence of any bleeding from the inferior vena cava or the aorta.  We continued to carefully dissect medially and inferiorly.  This was used with a combination of blunt dissection as well as LigaSure device.  The ureter was identified and a clip was placed on either side and the ureter was divided sharply.  The posterior portion of the kidney was quite adherent to the posterior peritoneum and underlying tissue.  We were however able to carefully release the posterior attachments.  Superiorly, the adrenal was identified and LigaSure device was used to release the superior attachments.  The adrenal was taken with the kidney.  At this point the entire kidney was released and passed off for specimen.  There was a lumbar artery that was bleeding in the nephrectomy bed that was closed with figure-of-eight 2-0 Vicryl.  There was then no evidence of any bleeding there.  There is evidence of significant fluid secondary to third spacing.  The fluid was very thin and did not appear to be active bleeding.  We inspected the entire aorta and there was no evidence of any bleeding there.  We did place a piece of Surgicel around the area of the aorta and renal vascular stumps.  We also placed FloSeal in the nephrectomy bed.   There was no evidence of any active bleeding.  We fully inspected the spleen and there was no violation of the spleen as well.  We then inspected the entire bowel.  Specifically, we inspected the portion of the bowel where we had divided some of the mesentery.  Mesentery was reapproximated with a running 2-0 silk suture loosely.  All of the bowel appeared to be fully healthy without any evidence of ischemia.  I placed a JP drain in the nephrectomy bed and brought it out the left side of the skin.  The underlying fascia was then reapproximated with running 0 looped PDS and the skin was closed with staples.  Exparel was used for anesthetic effect.  Dressing was applied and this concluded the operation.  The patient tolerated the procedure well and was stable postoperatively.  Plan: Obtain stat labs.  Continue fluids.  Hold off on DVT prophylaxis until hemoglobin is stable, except for SCDs..  She may have clear liquid sips.

## 2018-09-22 NOTE — Anesthesia Procedure Notes (Signed)
Arterial Line Insertion Start/End10/15/2019 1:25 PM, 09/22/2018 1:40 PM Performed by: Freddrick March, MD, anesthesiologist  Patient location: OR. Preanesthetic checklist: patient identified, IV checked, monitors and equipment checked, pre-op evaluation and anesthesia consent Emergency situation Patient sedated Left, radial was placed Catheter size: 20 Fr Hand hygiene performed  and maximum sterile barriers used   Attempts: 1 Procedure performed without using ultrasound guided technique. Following insertion, dressing applied and Biopatch. Post procedure assessment: normal and unchanged  Patient tolerated the procedure well with no immediate complications.

## 2018-09-22 NOTE — Anesthesia Postprocedure Evaluation (Signed)
Anesthesia Post Note  Patient: Sharon Henson  Procedure(s) Performed: NEPHRECTOMY (Left )     Patient location during evaluation: PACU Anesthesia Type: General Level of consciousness: awake and alert Pain management: pain level controlled Vital Signs Assessment: post-procedure vital signs reviewed and stable Respiratory status: spontaneous breathing, nonlabored ventilation, respiratory function stable and patient connected to nasal cannula oxygen Cardiovascular status: blood pressure returned to baseline and stable Postop Assessment: no apparent nausea or vomiting Anesthetic complications: no    Last Vitals:  Vitals:   09/22/18 1800 09/22/18 1840  BP:    Pulse: 85   Resp: 14 17  Temp:    SpO2: 97% 98%    Last Pain:  Vitals:   09/22/18 1840  TempSrc:   PainSc: 10-Worst pain ever                 Destine Ambroise L Auria Mckinlay

## 2018-09-22 NOTE — Anesthesia Preprocedure Evaluation (Addendum)
Anesthesia Evaluation  Patient identified by MRN, date of birth, ID band Patient awake    Reviewed: Allergy & Precautions, NPO status , Patient's Chart, lab work & pertinent test results  History of Anesthesia Complications (+) PONV  Airway Mallampati: I  TM Distance: >3 FB Neck ROM: Full    Dental no notable dental hx. (+) Teeth Intact, Dental Advisory Given   Pulmonary neg pulmonary ROS, former smoker,    Pulmonary exam normal breath sounds clear to auscultation       Cardiovascular negative cardio ROS Normal cardiovascular exam Rhythm:Regular Rate:Normal     Neuro/Psych negative neurological ROS  negative psych ROS   GI/Hepatic negative GI ROS, Neg liver ROS,   Endo/Other  negative endocrine ROS  Renal/GU negative Renal ROS  negative genitourinary   Musculoskeletal negative musculoskeletal ROS (+)   Abdominal   Peds  Hematology negative hematology ROS (+) anemia ,   Anesthesia Other Findings Presented with spontaneous retroperitoneal bleed 2/2 L hemmorhagic renal mass  Reproductive/Obstetrics                            Anesthesia Physical Anesthesia Plan  ASA: II  Anesthesia Plan: General   Post-op Pain Management:    Induction: Intravenous  PONV Risk Score and Plan: 4 or greater and Ondansetron, Dexamethasone, Scopolamine patch - Pre-op, Midazolam and TIVA  Airway Management Planned: Oral ETT  Additional Equipment:   Intra-op Plan:   Post-operative Plan: Extubation in OR  Informed Consent: I have reviewed the patients History and Physical, chart, labs and discussed the procedure including the risks, benefits and alternatives for the proposed anesthesia with the patient or authorized representative who has indicated his/her understanding and acceptance.   Dental advisory given  Plan Discussed with: CRNA  Anesthesia Plan Comments: (Emend for PONV ppx)         Anesthesia Quick Evaluation

## 2018-09-23 ENCOUNTER — Encounter (HOSPITAL_COMMUNITY): Payer: Self-pay | Admitting: Urology

## 2018-09-23 DIAGNOSIS — R21 Rash and other nonspecific skin eruption: Secondary | ICD-10-CM

## 2018-09-23 DIAGNOSIS — N2889 Other specified disorders of kidney and ureter: Secondary | ICD-10-CM

## 2018-09-23 LAB — BASIC METABOLIC PANEL
Anion gap: 8 (ref 5–15)
BUN: 18 mg/dL (ref 6–20)
CO2: 29 mmol/L (ref 22–32)
Calcium: 7.8 mg/dL — ABNORMAL LOW (ref 8.9–10.3)
Chloride: 101 mmol/L (ref 98–111)
Creatinine, Ser: 1.09 mg/dL — ABNORMAL HIGH (ref 0.44–1.00)
GFR calc Af Amer: >60 mL/min (ref >60)
GFR, EST NON AFRICAN AMERICAN: 54 mL/min — AB (ref >60)
GLUCOSE: 136 mg/dL — AB (ref 70–99)
POTASSIUM: 4.1 mmol/L (ref 3.5–5.1)
Sodium: 138 mmol/L (ref 135–145)

## 2018-09-23 LAB — POCT I-STAT 4, (NA,K, GLUC, HGB,HCT)
Glucose, Bld: 168 mg/dL — ABNORMAL HIGH (ref 70–99)
HEMATOCRIT: 24 % — AB (ref 36.0–46.0)
Hemoglobin: 8.2 g/dL — ABNORMAL LOW (ref 12.0–15.0)
Potassium: 3.7 mmol/L (ref 3.5–5.1)
Sodium: 139 mmol/L (ref 135–145)

## 2018-09-23 LAB — CBC
HCT: 33.6 % — ABNORMAL LOW (ref 36.0–46.0)
HCT: 34.3 % — ABNORMAL LOW (ref 36.0–46.0)
HEMOGLOBIN: 11 g/dL — AB (ref 12.0–15.0)
Hemoglobin: 11 g/dL — ABNORMAL LOW (ref 12.0–15.0)
MCH: 29.2 pg (ref 26.0–34.0)
MCH: 29 pg (ref 26.0–34.0)
MCHC: 32.7 g/dL (ref 30.0–36.0)
MCHC: 32.1 g/dL (ref 30.0–36.0)
MCV: 89.1 fL (ref 80.0–100.0)
MCV: 90.5 fL (ref 80.0–100.0)
NRBC: 0 % (ref 0.0–0.2)
NRBC: 0 % (ref 0.0–0.2)
PLATELETS: 261 10*3/uL (ref 150–400)
Platelets: 235 10*3/uL (ref 150–400)
RBC: 3.77 MIL/uL — AB (ref 3.87–5.11)
RBC: 3.79 MIL/uL — ABNORMAL LOW (ref 3.87–5.11)
RDW: 15.5 % (ref 11.5–15.5)
RDW: 15.7 % — ABNORMAL HIGH (ref 11.5–15.5)
WBC: 15 10*3/uL — AB (ref 4.0–10.5)
WBC: 19.5 10*3/uL — ABNORMAL HIGH (ref 4.0–10.5)

## 2018-09-23 MED ORDER — HYDROMORPHONE HCL 1 MG/ML IJ SOLN
1.0000 mg | INTRAMUSCULAR | Status: DC | PRN
  Administered 2018-09-23 (×4): 2 mg via INTRAVENOUS
  Filled 2018-09-23 (×4): qty 2

## 2018-09-23 MED ORDER — HYDROMORPHONE HCL 2 MG PO TABS
2.0000 mg | ORAL_TABLET | ORAL | Status: DC | PRN
  Administered 2018-09-23 (×3): 2 mg via ORAL
  Filled 2018-09-23 (×3): qty 1

## 2018-09-23 MED ORDER — HYDROMORPHONE HCL 1 MG/ML IJ SOLN
1.0000 mg | INTRAMUSCULAR | Status: DC | PRN
  Administered 2018-09-24: 2 mg via INTRAVENOUS
  Filled 2018-09-23: qty 1
  Filled 2018-09-23 (×2): qty 2

## 2018-09-23 MED ORDER — SODIUM CHLORIDE 0.45 % IV SOLN
INTRAVENOUS | Status: DC
  Administered 2018-09-23 (×2): via INTRAVENOUS

## 2018-09-23 MED ORDER — ACETAMINOPHEN 10 MG/ML IV SOLN
1000.0000 mg | Freq: Four times a day (QID) | INTRAVENOUS | Status: AC
  Administered 2018-09-23 – 2018-09-24 (×4): 1000 mg via INTRAVENOUS
  Filled 2018-09-23 (×6): qty 100

## 2018-09-23 MED ORDER — ENOXAPARIN SODIUM 30 MG/0.3ML ~~LOC~~ SOLN
30.0000 mg | SUBCUTANEOUS | Status: DC
  Filled 2018-09-23 (×3): qty 0.3

## 2018-09-23 NOTE — Progress Notes (Signed)
20 mg/mL of Dilaudid wasted in "Stericycle" (Hazardous waste disposal bin) with Maryclare Bean, RN.

## 2018-09-23 NOTE — Addendum Note (Signed)
Addendum  created 09/23/18 1224 by Maxwell Caul, CRNA   Charge Capture section accepted

## 2018-09-23 NOTE — Progress Notes (Addendum)
Urology Inpatient Progress Report  Dehydration [E86.0] Left renal mass [N28.89] Pilar Plate hematuria [R31.0] Sepsis, due to unspecified organism [A41.9] Acute renal failure, unspecified acute renal failure type ([place]) [N17.9]  Procedure(s): NEPHRECTOMY  1 Day Post-Op   Intv/Subj: No acute events overnight.  JP putting out a large amount.  1500 overnight.  Serous in color.  Patient has significant postoperative pain.  She requested the PCA come down and go to oral with IV breakthrough.  She has not gotten out of bed yet.  Hemoglobin is stable.   Principal Problem:   Retroperitoneal hematoma Active Problems:   Left renal mass   Pilar Plate hematuria   Acute renal failure (HCC)   Acute blood loss anemia  Current Facility-Administered Medications  Medication Dose Route Frequency Provider Last Rate Last Dose  . 0.9 %  sodium chloride infusion   Intravenous Continuous Marton Redwood III, MD 100 mL/hr at 09/23/18 0513    . calcium carbonate (TUMS - dosed in mg elemental calcium) chewable tablet 200 mg of elemental calcium  1 tablet Oral Q4H PRN Marton Redwood III, MD   200 mg of elemental calcium at 09/09/18 1136  . Chlorhexidine Gluconate Cloth 2 % PADS 6 each  6 each Topical Q0600 Marton Redwood III, MD   6 each at 09/23/18 [phone]  . feeding supplement (ENSURE ENLIVE) (ENSURE ENLIVE) liquid 237 mL  237 mL Oral BID BM Marton Redwood III, MD   237 mL at 09/23/18 0930  . hydrocortisone cream 1 % 1 application  1 application Topical TID PRN Lucas Mallow, MD   1 application at 40/08/67 [phone]  . HYDROmorphone (DILAUDID) injection 1-2 mg  1-2 mg Intravenous Q3H PRN Schorr, Rhetta Mura, NP   2 mg at 09/23/18 0933  . Influenza vac split quadrivalent PF (FLUARIX) injection 0.5 mL  0.5 mL Intramuscular Prior to discharge Marton Redwood III, MD      . labetalol (NORMODYNE,TRANDATE) injection 10 mg  10 mg Intravenous Q2H PRN Marton Redwood III, MD   10 mg at 09/21/18 1810  . MEDLINE mouth rinse  15 mL Mouth  Rinse BID Marton Redwood III, MD   15 mL at 09/22/18 0912  . metoprolol tartrate (LOPRESSOR) tablet 12.5 mg  12.5 mg Oral BID Marton Redwood III, MD   12.5 mg at 09/23/18 0931  . multivitamin with minerals tablet 1 tablet  1 tablet Oral Daily Marton Redwood III, MD   1 tablet at 09/23/18 0931  . mupirocin ointment (BACTROBAN) 2 % 1 application  1 application Nasal BID Lucas Mallow, MD   1 application at 09/32/67 [phone]  . senna-docusate (Senokot-S) tablet 1 tablet  1 tablet Oral QHS PRN Lucas Mallow, MD   1 tablet at 09/19/18 2023  . simethicone (MYLICON) chewable tablet 80 mg  80 mg Oral Q6H PRN Marton Redwood III, MD   80 mg at 09/23/18 0940  . sodium chloride flush (NS) 0.9 % injection 10-40 mL  10-40 mL Intracatheter PRN Marton Redwood III, MD   10 mL at 09/19/18 0500     Objective: Vital: Vitals:   09/23/18 0500 09/23/18 0600 09/23/18 0800 09/23/18 0931  BP:    (!) 135/98  Pulse: 78 81  86  Resp: 12 15    Temp:   98.5 F (36.9 C)   TempSrc:   Axillary   SpO2: 95% 94%    Weight:  Height:       I/Os: I/O last 3 completed shifts: In: 6912.5 [P.O.:240; I.V.:4696.6; Blood:1025.8; IV Piggyback:950] Out: 6250 [Urine:2020; Drains:2230; Blood:2000]  Physical Exam:  General: Patient is in no apparent distress Lungs: Normal respiratory effort, chest expands symmetrically. GI: Dressing is c/d/i. The abdomen is tender without mass, mild distention. JP drain with serous drainage Foley: Draining clear yellow urine Ext: lower extremities symmetric  Lab Results: Recent Labs    09/22/18 0321  09/22/18 1409 09/22/18 2000 09/23/18 0911  WBC 18.8*  --   --  15.0* 19.5*  HGB 10.0*   < > 8.2* 11.0* 11.0*  HCT 33.1*   < > 24.0* 33.6* 34.3*   < > = values in this interval not displayed.   Recent Labs    09/22/18 0321  09/22/18 1409 09/22/18 2000 09/23/18 0809  NA 142   < > 139 139 138  K 3.9   < > 3.7 4.2 4.1  CL 96*  --   --  101 101  CO2 36*  --   --  31 29   GLUCOSE 120*   < > 168* 257* 136*  BUN 15  --   --  16 18  CREATININE 0.69  --   --  1.02* 1.09*  CALCIUM 9.0  --   --  7.7* 7.8*   < > = values in this interval not displayed.   No results for input(s): LABPT, INR in the last 72 hours. No results for input(s): LABURIN in the last 72 hours. Results for orders placed or performed during the hospital encounter of 09/05/18  Blood Culture (routine x 2)     Status: None   Collection Time: 09/05/18  9:44 AM  Result Value Ref Range Status   Specimen Description BLOOD WEAKLY REACTIVE LEFT  Final   Special Requests   Final    BOTTLES DRAWN AEROBIC ONLY Blood Culture results may not be optimal due to an inadequate volume of blood received in culture bottles   Culture   Final    NO GROWTH 5 DAYS Performed at [facility], [place] [address], [place], [place] [address]    Report Status 09/10/2018 FINAL  Final  Blood Culture (routine x 2)     Status: None   Collection Time: 09/05/18  9:49 AM  Result Value Ref Range Status   Specimen Description BLOOD LEFT HAND  Final   Special Requests   Final    BOTTLES DRAWN AEROBIC ONLY Blood Culture results may not be optimal due to an inadequate volume of blood received in culture bottles   Culture   Final    NO GROWTH 5 DAYS Performed at [facility], [place] [address], [place], [place] [address]    Report Status 09/10/2018 FINAL  Final  Urine C&S     Status: None   Collection Time: 09/05/18  1:22 PM  Result Value Ref Range Status   Specimen Description URINE, RANDOM  Final   Special Requests NONE  Final   Culture   Final    NO GROWTH Performed at [facility], [place] [address] [place], [place] [address]    Report Status 09/06/2018 FINAL  Final  MRSA PCR Screening     Status: None   Collection Time: 09/14/18  7:50 PM  Result Value Ref Range Status   MRSA by PCR NEGATIVE NEGATIVE Final    Comment:        The GeneXpert MRSA Assay (FDA  approved for NASAL specimens only), is one  component of a comprehensive MRSA colonization surveillance program. It is not intended to diagnose MRSA infection nor to guide or monitor treatment for MRSA infections. Performed at [facility], [place] [address], [place], [place] [address]   MRSA PCR Screening     Status: Abnormal   Collection Time: 09/20/18  5:17 PM  Result Value Ref Range Status   MRSA by PCR POSITIVE (A) NEGATIVE Final    Comment:        The GeneXpert MRSA Assay (FDA approved for NASAL specimens only), is one component of a comprehensive MRSA colonization surveillance program. It is not intended to diagnose MRSA infection nor to guide or monitor treatment for MRSA infections. RESULT CALLED TO, READ BACK BY AND VERIFIED WITH: Felipa Evener RN [phone] 09/20/2018 HILL K Performed at [facility], [place] [address], [place], [place] [address]     Studies/Results: Dg Chest 1 View  Result Date: 09/22/2018 CLINICAL DATA:  Hypoxia.  Left nephrectomy today EXAM: CHEST  1 VIEW COMPARISON:  CT chest 09/05/2018.  CT abdomen 09/15/2018 FINDINGS: Right jugular central venous catheter tip in the lower SVC. No pneumothorax. Right lung clear Left lower lobe effusion and consolidation/atelectasis unchanged from CT of 09/15/2018. No pneumothorax on the left. Heart size normal. IMPRESSION: Left lower lobe atelectasis and effusion unchanged from CT of 09/15/2018 Central venous catheter tip in the SVC.  No pneumothorax. Electronically Signed   By: Franchot Gallo M.D.   On: 09/22/2018 15:13    Assessment: Left renal mass status post left radical nephrectomy  Procedure(s): NEPHRECTOMY, 1 Day Post-Op  doing well.  Plan: Keep diet a clears Keep fluid at 100 cc/h until taking in more p.o., plan to switch fluids to half-normal saline Get physical therapy involved SCDs, add Lovenox DVT prophylaxis A line may come out.  HD catheter can come out as well Keep 1 more day on stepdown and if she does well, transfer to  floor in the morning. Keep JP.  If he continues to put out large amounts over the next couple days, we will plan to check and rule out pancreatic leak or pancreatic leak.  May be secondary to third spacing.  hopefully it will start slowing down. Keep Foley for today until she is more mobile.    Link Snuffer, MD Urology 09/23/2018, 10:31 AM

## 2018-09-23 NOTE — Care Management Note (Signed)
Case Management Note  Patient Details  Name: Sharon Henson MRN: [id number] Date of Birth: 12/16/1960  Subjective/Objective:                  [id number] underwent left nephrectomy, s/p icu, a.line and o2,iv ns at 100cc/hrs,one unit of prbc's given am of [id number].  Action/Plan: Lives alone has good family support Will follow for progression of care and clinical status. Will follow for case management needs none present at this time. Expected Discharge Date:                  Expected Discharge Plan:  Home/Self Care  In-House Referral:     Discharge planning Services  CM Consult  Post Acute Care Choice:    Choice offered to:     DME Arranged:    DME Agency:     HH Arranged:    HH Agency:     Status of Service:  In process, will continue to follow  If discussed at Long Length of Stay Meetings, dates discussed:    Additional Comments:  Leeroy Cha, RN 09/23/2018, 8:28 AM

## 2018-09-23 NOTE — Addendum Note (Signed)
Addendum  created 09/23/18 0808 by Lollie Sails, CRNA   Charge Capture section accepted

## 2018-09-23 NOTE — Addendum Note (Signed)
Addendum  created 09/23/18 0806 by Lollie Sails, CRNA   Charge Capture section accepted

## 2018-09-23 NOTE — Progress Notes (Signed)
PROGRESS NOTE    Sharon Henson  NTZ:[id number] DOB: [birth year] DOA: 09/05/2018 PCP: Lin Landsman, MD   Brief Narrative: Sharon Henson is a 57 y.o. female with a history of   Assessment & Plan:   Principal Problem:   Retroperitoneal hematoma Active Problems:   Left renal mass   Pilar Plate hematuria   Acute renal failure (HCC)   Acute blood loss anemia   Retroperitoneal hematoma In setting of left renal mass. S/p nephrectomy. -Urology  Acute kidney injury ATN secondary to blood loss anemia.  Elevated blood pressure Started on metoprolol. Well controlled. -Continue metoprolol for now, but may discontinue as blood pressure may have been elevated secondary to pain.  Right sided chest pain Reproducible.  Macular rash Unknown etiology. Has hydrocortisone on board. Itchy. -Continue hydrocortisone cream   DVT prophylaxis: Lovenox Code Status:   Code Status: Full Code Family Communication: None Disposition Plan: Discharge per primary   Procedures:   Left nephrectomy  Antimicrobials:  Ancef    Subjective: Some right sided chest pain  Objective: Vitals:   09/23/18 1200 09/23/18 1213 09/23/18 1519 09/23/18 1600  BP:  116/65 119/60 124/70  Pulse: 81 81 75 78  Resp: 15 15 16 16   Temp: 98.5 F (36.9 C)   97.8 F (36.6 C)  TempSrc: Oral   Oral  SpO2: 93% 92% 94% 95%  Weight:      Height:        Intake/Output Summary (Last 24 hours) at 09/23/2018 1720 Last data filed at 09/23/2018 1500 Gross per 24 hour  Intake 2524.06 ml  Output 2475 ml  Net 49.06 ml   Filed Weights   09/22/18 0337 09/22/18 1045 09/23/18 0356  Weight: 64 kg 64 kg 58.8 kg    Examination:  General exam: Appears calm and comfortable  Respiratory system: Clear to auscultation. Respiratory effort normal. Reproducible right sided chest tenderness. Cardiovascular system: S1 & S2 heard, RRR. No murmurs, rubs, gallops or clicks. Gastrointestinal system: Abdomen is nondistended, soft and  nontender. No organomegaly or masses felt. Normal bowel sounds heard. Central nervous system: Alert and oriented. No focal neurological deficits. Extremities: No edema. No calf tenderness Skin: No cyanosis. Macular rash on sides of abdomen bilaterally. Psychiatry: Judgement and insight appear normal. Mood & affect appropriate.     Data Reviewed: I have personally reviewed following labs and imaging studies  CBC: Recent Labs  Lab 09/20/18 0428 09/21/18 0356 09/22/18 0321 09/22/18 1319 09/22/18 1409 09/22/18 2000 09/23/18 0911  WBC 25.1* 23.6* 18.8*  --   --  15.0* 19.5*  NEUTROABS  --   --  14.6*  --   --   --   --   HGB 8.6* 10.2* 10.0* 7.8* 8.2* 11.0* 11.0*  HCT 27.3* 32.9* 33.1* 23.0* 24.0* 33.6* 34.3*  MCV 91.9 94.5 95.1  --   --  89.1 90.5  PLT 342 308 308  --   --  235 675   Basic Metabolic Panel: Recent Labs  Lab 09/19/18 0317 09/20/18 0428 09/21/18 0356 09/22/18 0321 09/22/18 1319 09/22/18 1409 09/22/18 2000 09/23/18 0809  NA 135 137  --  142 138 139 139 138  K 4.1 4.2  --  3.9 3.7 3.7 4.2 4.1  CL 95* 96*  --  96*  --   --  101 101  CO2 30 30  --  36*  --   --  31 29  GLUCOSE 142* 123*  --  120* 120* 168* 257* 136*  BUN 23*  22*  --  15  --   --  16 18  CREATININE 1.13* 0.82  --  0.69  --   --  1.02* 1.09*  CALCIUM 8.8* 8.5*  --  9.0  --   --  7.7* 7.8*  MG  --   --  1.5*  --   --   --   --   --    GFR: Estimated Creatinine Clearance: 44.6 mL/min (A) (by C-G formula based on SCr of 1.09 mg/dL (H)). Liver Function Tests: No results for input(s): AST, ALT, ALKPHOS, BILITOT, PROT, ALBUMIN in the last 168 hours. No results for input(s): LIPASE, AMYLASE in the last 168 hours. No results for input(s): AMMONIA in the last 168 hours. Coagulation Profile: No results for input(s): INR, PROTIME in the last 168 hours. Cardiac Enzymes: No results for input(s): CKTOTAL, CKMB, CKMBINDEX, TROPONINI in the last 168 hours. BNP (last 3 results) No results for input(s):  PROBNP in the last 8760 hours. HbA1C: No results for input(s): HGBA1C in the last 72 hours. CBG: No results for input(s): GLUCAP in the last 168 hours. Lipid Profile: No results for input(s): CHOL, HDL, LDLCALC, TRIG, CHOLHDL, LDLDIRECT in the last 72 hours. Thyroid Function Tests: No results for input(s): TSH, T4TOTAL, FREET4, T3FREE, THYROIDAB in the last 72 hours. Anemia Panel: No results for input(s): VITAMINB12, FOLATE, FERRITIN, TIBC, IRON, RETICCTPCT in the last 72 hours. Sepsis Labs: No results for input(s): PROCALCITON, LATICACIDVEN in the last 168 hours.  Recent Results (from the past 240 hour(s))  MRSA PCR Screening     Status: None   Collection Time: 09/14/18  7:50 PM  Result Value Ref Range Status   MRSA by PCR NEGATIVE NEGATIVE Final    Comment:        The GeneXpert MRSA Assay (FDA approved for NASAL specimens only), is one component of a comprehensive MRSA colonization surveillance program. It is not intended to diagnose MRSA infection nor to guide or monitor treatment for MRSA infections. Performed at [facility], [place] [address], [place], [place] [address]   MRSA PCR Screening     Status: Abnormal   Collection Time: 09/20/18  5:17 PM  Result Value Ref Range Status   MRSA by PCR POSITIVE (A) NEGATIVE Final    Comment:        The GeneXpert MRSA Assay (FDA approved for NASAL specimens only), is one component of a comprehensive MRSA colonization surveillance program. It is not intended to diagnose MRSA infection nor to guide or monitor treatment for MRSA infections. RESULT CALLED TO, READ BACK BY AND VERIFIED WITH: Felipa Evener RN [phone] 09/20/2018 HILL K Performed at [facility], [place] [address] [place], [place] [address]          Radiology Studies: Dg Chest 1 View  Result Date: 09/22/2018 CLINICAL DATA:  Hypoxia.  Left nephrectomy today EXAM: CHEST  1 VIEW COMPARISON:  CT chest 09/05/2018.  CT abdomen 09/15/2018 FINDINGS:  Right jugular central venous catheter tip in the lower SVC. No pneumothorax. Right lung clear Left lower lobe effusion and consolidation/atelectasis unchanged from CT of 09/15/2018. No pneumothorax on the left. Heart size normal. IMPRESSION: Left lower lobe atelectasis and effusion unchanged from CT of 09/15/2018 Central venous catheter tip in the SVC.  No pneumothorax. Electronically Signed   By: Franchot Gallo M.D.   On: 09/22/2018 15:13        Scheduled Meds: . Chlorhexidine Gluconate Cloth  6 each Topical Q0600  .  enoxaparin (LOVENOX) injection  30 mg Subcutaneous Q24H  . feeding supplement (ENSURE ENLIVE)  237 mL Oral BID BM  . mouth rinse  15 mL Mouth Rinse BID  . metoprolol tartrate  12.5 mg Oral BID  . multivitamin with minerals  1 tablet Oral Daily  . mupirocin ointment  1 application Nasal BID   Continuous Infusions: . sodium chloride 100 mL/hr at 09/23/18 1300  . acetaminophen Stopped (09/23/18 1211)     LOS: 18 days     Cordelia Poche, MD [facility] 09/23/2018, 5:20 PM  If 7PM-7AM, please contact night-coverage [email or web address]

## 2018-09-24 LAB — TYPE AND SCREEN
ABO/RH(D): A POS
Antibody Screen: NEGATIVE
UNIT DIVISION: 0
Unit division: 0
Unit division: 0
Unit division: 0

## 2018-09-24 LAB — BPAM RBC
BLOOD PRODUCT EXPIRATION DATE: 201911062359
BLOOD PRODUCT EXPIRATION DATE: 201911072359
Blood Product Expiration Date: 201911062359
Blood Product Expiration Date: 201911072359
ISSUE DATE / TIME: 201910151316
ISSUE DATE / TIME: 201910151316
ISSUE DATE / TIME: 201910151458
UNIT TYPE AND RH: 6200
Unit Type and Rh: 6200
Unit Type and Rh: 6200
Unit Type and Rh: 6200

## 2018-09-24 LAB — CBC
HCT: 30.7 % — ABNORMAL LOW (ref 36.0–46.0)
HEMATOCRIT: 34.8 % — AB (ref 36.0–46.0)
HEMOGLOBIN: 9.9 g/dL — AB (ref 12.0–15.0)
HEMOGLOBIN: 11.2 g/dL — AB (ref 12.0–15.0)
MCH: 28.9 pg (ref 26.0–34.0)
MCH: 28.9 pg (ref 26.0–34.0)
MCHC: 32.2 g/dL (ref 30.0–36.0)
MCHC: 32.2 g/dL (ref 30.0–36.0)
MCV: 89.5 fL (ref 80.0–100.0)
MCV: 89.7 fL (ref 80.0–100.0)
Platelets: 279 10*3/uL (ref 150–400)
Platelets: 294 10*3/uL (ref 150–400)
RBC: 3.43 MIL/uL — AB (ref 3.87–5.11)
RBC: 3.88 MIL/uL (ref 3.87–5.11)
RDW: 15 % (ref 11.5–15.5)
RDW: 15 % (ref 11.5–15.5)
WBC: 15.8 10*3/uL — ABNORMAL HIGH (ref 4.0–10.5)
WBC: 17.3 10*3/uL — AB (ref 4.0–10.5)
nRBC: 0 % (ref 0.0–0.2)
nRBC: 0 % (ref 0.0–0.2)

## 2018-09-24 LAB — BASIC METABOLIC PANEL
Anion gap: 6 (ref 5–15)
BUN: 20 mg/dL (ref 6–20)
CHLORIDE: 102 mmol/L (ref 98–111)
CO2: 30 mmol/L (ref 22–32)
CREATININE: 1.36 mg/dL — AB (ref 0.44–1.00)
Calcium: 8 mg/dL — ABNORMAL LOW (ref 8.9–10.3)
GFR calc Af Amer: 48 mL/min — ABNORMAL LOW (ref >60)
GFR calc non Af Amer: 42 mL/min — ABNORMAL LOW (ref >60)
Glucose, Bld: 108 mg/dL — ABNORMAL HIGH (ref 70–99)
POTASSIUM: 3.8 mmol/L (ref 3.5–5.1)
SODIUM: 138 mmol/L (ref 135–145)

## 2018-09-24 LAB — CREATININE, URINE, RANDOM: Creatinine, Urine: 37.16 mg/dL

## 2018-09-24 LAB — SODIUM, URINE, RANDOM: Sodium, Ur: 26 mmol/L

## 2018-09-24 MED ORDER — OXYCODONE HCL 5 MG PO TABS
10.0000 mg | ORAL_TABLET | ORAL | Status: DC | PRN
  Administered 2018-09-24 – 2018-09-28 (×26): 10 mg via ORAL
  Filled 2018-09-24 (×28): qty 2

## 2018-09-24 MED ORDER — HYDROMORPHONE HCL 4 MG PO TABS
4.0000 mg | ORAL_TABLET | ORAL | Status: DC | PRN
  Administered 2018-09-24 (×4): 4 mg via ORAL
  Filled 2018-09-24 (×2): qty 1
  Filled 2018-09-24 (×2): qty 2

## 2018-09-24 NOTE — Progress Notes (Signed)
PROGRESS NOTE    Sharon Henson  WYO:[id number] DOB: August 26, 1961 DOA: 09/05/2018 PCP: Lin Landsman, MD   Brief Narrative: Sharon Henson is a 57 y.o. female with a history of tobacco use. She was found to have a retroperitoneal hematoma in addition to AKI. Imaging showed left renal mass for which she is s/p left nephrectomy. AKI improved, but now slightly trending up.   Assessment & Plan:   Principal Problem:   Retroperitoneal hematoma Active Problems:   Left renal mass   Pilar Plate hematuria   Acute renal failure (HCC)   Acute blood loss anemia   Retroperitoneal hematoma In setting of left renal mass. S/p nephrectomy. -Urology  Acute kidney injury ATN secondary to blood loss anemia. Trended down however now trending back up slightly -Urine studies -Repeat BMP  Elevated blood pressure Started on metoprolol. Well controlled. -Continue metoprolol for now, but may discontinue as blood pressure may have been elevated secondary to pain.  Right sided chest pain Reproducible.  Macular rash Unknown etiology. Has hydrocortisone on board. Itchy. -Continue hydrocortisone cream  Leukocytosis No overt evidence of infection. Stable. Afebrile.   DVT prophylaxis: Lovenox Code Status:   Code Status: Full Code Family Communication: None Disposition Plan: Discharge per primary   Procedures:   Left nephrectomy  Antimicrobials:  Ancef    Subjective: Right sided chest pain resolved.  Objective: Vitals:   09/24/18 0635 09/24/18 0800 09/24/18 1015 09/24/18 1326  BP: 133/67 (!) 149/70 (!) 145/80 128/78  Pulse: 76 79 70 75  Resp: 19 20 18 20   Temp:  98.8 F (37.1 C) (!) 97.5 F (36.4 C) 98 F (36.7 C)  TempSrc:  Axillary  Oral  SpO2: 97% 97% 97% 95%  Weight:      Height:        Intake/Output Summary (Last 24 hours) at 09/24/2018 1441 Last data filed at 09/24/2018 1000 Gross per 24 hour  Intake 1584.6 ml  Output 2325 ml  Net -740.4 ml   Filed Weights   09/22/18 1045 09/23/18 0356 09/24/18 0425  Weight: 64 kg 58.8 kg 62.3 kg    Examination:  General: Well appearing, no distress    Data Reviewed: I have personally reviewed following labs and imaging studies  CBC: Recent Labs  Lab 09/22/18 0321  09/22/18 1409 09/22/18 2000 09/23/18 0911 09/24/18 0312 09/24/18 0824  WBC 18.8*  --   --  15.0* 19.5* 15.8* 17.3*  NEUTROABS 14.6*  --   --   --   --   --   --   HGB 10.0*   < > 8.2* 11.0* 11.0* 9.9* 11.2*  HCT 33.1*   < > 24.0* 33.6* 34.3* 30.7* 34.8*  MCV 95.1  --   --  89.1 90.5 89.5 89.7  PLT 308  --   --  235 261 279 294   < > = values in this interval not displayed.   Basic Metabolic Panel: Recent Labs  Lab 09/20/18 0428 09/21/18 0356 09/22/18 0321 09/22/18 1319 09/22/18 1409 09/22/18 2000 09/23/18 0809 09/24/18 0312  NA 137  --  142 138 139 139 138 138  K 4.2  --  3.9 3.7 3.7 4.2 4.1 3.8  CL 96*  --  96*  --   --  101 101 102  CO2 30  --  36*  --   --  31 29 30   GLUCOSE 123*  --  120* 120* 168* 257* 136* 108*  BUN 22*  --  15  --   --  16 18 20   CREATININE 0.82  --  0.69  --   --  1.02* 1.09* 1.36*  CALCIUM 8.5*  --  9.0  --   --  7.7* 7.8* 8.0*  MG  --  1.5*  --   --   --   --   --   --    GFR: Estimated Creatinine Clearance: 36.7 mL/min (A) (by C-G formula based on SCr of 1.36 mg/dL (H)). Liver Function Tests: No results for input(s): AST, ALT, ALKPHOS, BILITOT, PROT, ALBUMIN in the last 168 hours. No results for input(s): LIPASE, AMYLASE in the last 168 hours. No results for input(s): AMMONIA in the last 168 hours. Coagulation Profile: No results for input(s): INR, PROTIME in the last 168 hours. Cardiac Enzymes: No results for input(s): CKTOTAL, CKMB, CKMBINDEX, TROPONINI in the last 168 hours. BNP (last 3 results) No results for input(s): PROBNP in the last 8760 hours. HbA1C: No results for input(s): HGBA1C in the last 72 hours. CBG: No results for input(s): GLUCAP in the last 168 hours. Lipid  Profile: No results for input(s): CHOL, HDL, LDLCALC, TRIG, CHOLHDL, LDLDIRECT in the last 72 hours. Thyroid Function Tests: No results for input(s): TSH, T4TOTAL, FREET4, T3FREE, THYROIDAB in the last 72 hours. Anemia Panel: No results for input(s): VITAMINB12, FOLATE, FERRITIN, TIBC, IRON, RETICCTPCT in the last 72 hours. Sepsis Labs: No results for input(s): PROCALCITON, LATICACIDVEN in the last 168 hours.  Recent Results (from the past 240 hour(s))  MRSA PCR Screening     Status: None   Collection Time: 09/14/18  7:50 PM  Result Value Ref Range Status   MRSA by PCR NEGATIVE NEGATIVE Final    Comment:        The GeneXpert MRSA Assay (FDA approved for NASAL specimens only), is one component of a comprehensive MRSA colonization surveillance program. It is not intended to diagnose MRSA infection nor to guide or monitor treatment for MRSA infections. Performed at [facility], [place] [address], [place], [place] [address]   MRSA PCR Screening     Status: Abnormal   Collection Time: 09/20/18  5:17 PM  Result Value Ref Range Status   MRSA by PCR POSITIVE (A) NEGATIVE Final    Comment:        The GeneXpert MRSA Assay (FDA approved for NASAL specimens only), is one component of a comprehensive MRSA colonization surveillance program. It is not intended to diagnose MRSA infection nor to guide or monitor treatment for MRSA infections. RESULT CALLED TO, READ BACK BY AND VERIFIED WITH: Felipa Evener RN [id number] 09/20/2018 HILL K Performed at [facility], [place] [address] [place], [place] [address]          Radiology Studies: Dg Chest 1 View  Result Date: 09/22/2018 CLINICAL DATA:  Hypoxia.  Left nephrectomy today EXAM: CHEST  1 VIEW COMPARISON:  CT chest 09/05/2018.  CT abdomen 09/15/2018 FINDINGS: Right jugular central venous catheter tip in the lower SVC. No pneumothorax. Right lung clear Left lower lobe effusion and consolidation/atelectasis unchanged from  CT of 09/15/2018. No pneumothorax on the left. Heart size normal. IMPRESSION: Left lower lobe atelectasis and effusion unchanged from CT of 09/15/2018 Central venous catheter tip in the SVC.  No pneumothorax. Electronically Signed   By: Franchot Gallo M.D.   On: 09/22/2018 15:13        Scheduled Meds: . Chlorhexidine Gluconate Cloth  6 each Topical Q0600  . enoxaparin (LOVENOX) injection  30 mg Subcutaneous Q24H  .  feeding supplement (ENSURE ENLIVE)  237 mL Oral BID BM  . mouth rinse  15 mL Mouth Rinse BID  . metoprolol tartrate  12.5 mg Oral BID  . multivitamin with minerals  1 tablet Oral Daily  . mupirocin ointment  1 application Nasal BID   Continuous Infusions:    LOS: 19 days     Cordelia Poche, MD [facility] 09/24/2018, 2:41 PM  If 7PM-7AM, please contact night-coverage [email or web address]

## 2018-09-24 NOTE — Evaluation (Signed)
Physical Therapy Evaluation Patient Details Name: Sharon Henson MRN: [id number] DOB: 22-Jul-1961 Today's Date: 09/24/2018   History of Present Illness  57 yo female admitted with retroperitoneal hematoma, anemia, ARF. s/p open L radical nephrectomy 10/15  Clinical Impression  On eval, pt required Min assist for mobility. She was able to stand and pivot, bed to bsc, then take a few ambulatory steps in the room. Distance was limited by leakage. Will likely need to don mesh panties and a pad for ambulation next session. Recommend daily OOB/ambulation with nursing, in addition to PT session. Recommend HHPT at this time.     Follow Up Recommendations Home health PT;Supervision/Assistance - 24 hour    Equipment Recommendations  Rolling walker with 5" wheels    Recommendations for Other Services       Precautions / Restrictions Precautions Precautions: Fall Precaution Comments: L jp drain.  Will need mesh panties and a pad for leakage Restrictions Weight Bearing Restrictions: No      Mobility  Bed Mobility Overal bed mobility: Needs Assistance Bed Mobility: Supine to Sit     Supine to sit: HOB elevated;Min assist     General bed mobility comments: Increased time. Assist for trunk. Mod cues for technique and to encourage pt to do as much as she could  Transfers Overall transfer level: Needs assistance Equipment used: Rolling walker (2 wheeled) Transfers: Sit to/from Stand Sit to Stand: Min assist;From elevated surface         General transfer comment: x2. Vcs safety, technique, hand placement. Increased time.   Ambulation/Gait Ambulation/Gait assistance: Min assist Gait Distance (Feet): 5 Feet Assistive device: Rolling walker (2 wheeled) Gait Pattern/deviations: Trunk flexed     General Gait Details: Assist to stabilize pt and manevuer with RW. Distance limited due to pt leaking. Only took a few steps in the room  Stairs            Wheelchair Mobility     Modified Rankin (Stroke Patients Only)       Balance Overall balance assessment: Needs assistance         Standing balance support: Bilateral upper extremity supported Standing balance-Leahy Scale: Poor                               Pertinent Vitals/Pain Pain Assessment: Faces Faces Pain Scale: Hurts whole lot Pain Location: abdomen (gas pains) Pain Descriptors / Indicators: Discomfort;Grimacing;Operative site guarding Pain Intervention(s): Limited activity within patient's tolerance;Repositioned    Home Living Family/patient expects to be discharged to:: Private residence Living Arrangements: Spouse/significant other   Type of Home: House Home Access: Stairs to enter   Technical brewer of Steps: 3 Home Layout: Two level;Bed/bath upstairs Home Equipment: None      Prior Function Level of Independence: Independent               Hand Dominance        Extremity/Trunk Assessment   Upper Extremity Assessment Upper Extremity Assessment: Generalized weakness    Lower Extremity Assessment Lower Extremity Assessment: Generalized weakness    Cervical / Trunk Assessment Cervical / Trunk Assessment: Normal  Communication   Communication: No difficulties  Cognition Arousal/Alertness: Awake/alert Behavior During Therapy: WFL for tasks assessed/performed Overall Cognitive Status: Within Functional Limits for tasks assessed  General Comments      Exercises     Assessment/Plan    PT Assessment Patient needs continued PT services  PT Problem List Decreased strength;Decreased balance;Decreased mobility;Decreased activity tolerance;Decreased range of motion;Pain;Decreased knowledge of use of DME       PT Treatment Interventions DME instruction;Gait training;Functional mobility training;Therapeutic activities;Balance training;Patient/family education;Therapeutic exercise    PT  Goals (Current goals can be found in the Care Plan section)  Acute Rehab PT Goals Patient Stated Goal: less pain PT Goal Formulation: With patient Time For Goal Achievement: 10/08/18 Potential to Achieve Goals: Good    Frequency Min 3X/week   Barriers to discharge        Co-evaluation               AM-PAC PT "6 Clicks" Daily Activity  Outcome Measure Difficulty turning over in bed (including adjusting bedclothes, sheets and blankets)?: Unable Difficulty moving from lying on back to sitting on the side of the bed? : Unable Difficulty sitting down on and standing up from a chair with arms (e.g., wheelchair, bedside commode, etc,.)?: Unable Help needed moving to and from a bed to chair (including a wheelchair)?: A Lot Help needed walking in hospital room?: A Lot Help needed climbing 3-5 steps with a railing? : Total 6 Click Score: 8    End of Session   Activity Tolerance: Patient limited by pain Patient left: in chair;with call bell/phone within reach Nurse Communication: (made NT aware pt needed some assistance with clean up due to leakage) PT Visit Diagnosis: Pain;Muscle weakness (generalized) (M62.81);Difficulty in walking, not elsewhere classified (R26.2)    Time: 2263-3354 PT Time Calculation (min) (ACUTE ONLY): 35 min   Charges:   PT Evaluation $PT Eval Moderate Complexity: 1 Mod PT Treatments $Therapeutic Activity: 8-22 mins          Weston Anna, PT Acute Rehabilitation Services Pager: [phone] Office: [phone]  '

## 2018-09-24 NOTE — Progress Notes (Signed)
Urology Inpatient Progress Report  Dehydration [E86.0] Left renal mass [N28.89] Pilar Plate hematuria [R31.0] Sepsis, due to unspecified organism [A41.9] Acute renal failure, unspecified acute renal failure type ([place]) [N17.9]  Procedure(s): NEPHRECTOMY  2 Days Post-Op   Intv/Subj: No acute events overnight. Patient is without complaint except for postoperative pain.  She is passing flatus.  Principal Problem:   Retroperitoneal hematoma Active Problems:   Left renal mass   Pilar Plate hematuria   Acute renal failure (HCC)   Acute blood loss anemia  Current Facility-Administered Medications  Medication Dose Route Frequency Provider Last Rate Last Dose  . calcium carbonate (TUMS - dosed in mg elemental calcium) chewable tablet 200 mg of elemental calcium  1 tablet Oral Q4H PRN Marton Redwood III, MD   200 mg of elemental calcium at 09/24/18 0316  . Chlorhexidine Gluconate Cloth 2 % PADS 6 each  6 each Topical Q0600 Lucas Mallow, MD   6 each at 09/24/18 [phone]  . enoxaparin (LOVENOX) injection 30 mg  30 mg Subcutaneous Q24H Marton Redwood III, MD      . feeding supplement (ENSURE ENLIVE) (ENSURE ENLIVE) liquid 237 mL  237 mL Oral BID BM Marton Redwood III, MD   237 mL at 09/23/18 0930  . hydrocortisone cream 1 % 1 application  1 application Topical TID PRN Lucas Mallow, MD   1 application at 32/44/01 [phone]  . HYDROmorphone (DILAUDID) injection 1-2 mg  1-2 mg Intravenous Q3H PRN Marton Redwood III, MD   2 mg at 09/24/18 0609  . HYDROmorphone (DILAUDID) tablet 2 mg  2 mg Oral Q3H PRN Marton Redwood III, MD   2 mg at 09/23/18 2231  . Influenza vac split quadrivalent PF (FLUARIX) injection 0.5 mL  0.5 mL Intramuscular Prior to discharge Marton Redwood III, MD      . labetalol (NORMODYNE,TRANDATE) injection 10 mg  10 mg Intravenous Q2H PRN Marton Redwood III, MD   10 mg at 09/21/18 1810  . MEDLINE mouth rinse  15 mL Mouth Rinse BID Marton Redwood III, MD   15 mL at 09/23/18 1144  .  metoprolol tartrate (LOPRESSOR) tablet 12.5 mg  12.5 mg Oral BID Marton Redwood III, MD   12.5 mg at 09/23/18 2231  . multivitamin with minerals tablet 1 tablet  1 tablet Oral Daily Marton Redwood III, MD   1 tablet at 09/23/18 0931  . mupirocin ointment (BACTROBAN) 2 % 1 application  1 application Nasal BID Lucas Mallow, MD   1 application at 53/66/44 2231  . senna-docusate (Senokot-S) tablet 1 tablet  1 tablet Oral QHS PRN Lucas Mallow, MD   1 tablet at 09/23/18 2343  . simethicone (MYLICON) chewable tablet 80 mg  80 mg Oral Q6H PRN Marton Redwood III, MD   80 mg at 09/23/18 2231  . sodium chloride flush (NS) 0.9 % injection 10-40 mL  10-40 mL Intracatheter PRN Marton Redwood III, MD   10 mL at 09/19/18 0500     Objective: Vital: Vitals:   09/24/18 0000 09/24/18 0200 09/24/18 0425 09/24/18 0635  BP: 133/65 134/68  133/67  Pulse: 73 70 65 76  Resp: 19 20 20 19   Temp:   98.1 F (36.7 C)   TempSrc:   Oral   SpO2: 94% 92% 96% 97%  Weight:   62.3 kg   Height:       I/Os: I/O last 3 completed  shifts: In: 3472.8 [P.O.:340; I.V.:2751.3; IV Piggyback:381.5] Out: 5102 [Urine:1825; Drains:2215]  Physical Exam:  General: Patient is in no apparent distress Lungs: Normal respiratory effort, chest expands symmetrically. GI: Incisions are c/d/i. The abdomen is soft and moderately but not overly tender without mass.  Distention is better. JP drain with serous drainage Foley: Draining clear yellow urine Ext: lower extremities symmetric  Lab Results: Recent Labs    09/22/18 2000 09/23/18 0911 09/24/18 0312  WBC 15.0* 19.5* 15.8*  HGB 11.0* 11.0* 9.9*  HCT 33.6* 34.3* 30.7*   Recent Labs    09/22/18 2000 09/23/18 0809 09/24/18 0312  NA 139 138 138  K 4.2 4.1 3.8  CL 101 101 102  CO2 31 29 30   GLUCOSE 257* 136* 108*  BUN 16 18 20   CREATININE 1.02* 1.09* 1.36*  CALCIUM 7.7* 7.8* 8.0*   No results for input(s): LABPT, INR in the last 72 hours. No results for  input(s): LABURIN in the last 72 hours. Results for orders placed or performed during the hospital encounter of 09/05/18  Blood Culture (routine x 2)     Status: None   Collection Time: 09/05/18  9:44 AM  Result Value Ref Range Status   Specimen Description BLOOD WEAKLY REACTIVE LEFT  Final   Special Requests   Final    BOTTLES DRAWN AEROBIC ONLY Blood Culture results may not be optimal due to an inadequate volume of blood received in culture bottles   Culture   Final    NO GROWTH 5 DAYS Performed at [facility], [place] [address], [place], [place] [address]    Report Status 09/10/2018 FINAL  Final  Blood Culture (routine x 2)     Status: None   Collection Time: 09/05/18  9:49 AM  Result Value Ref Range Status   Specimen Description BLOOD LEFT HAND  Final   Special Requests   Final    BOTTLES DRAWN AEROBIC ONLY Blood Culture results may not be optimal due to an inadequate volume of blood received in culture bottles   Culture   Final    NO GROWTH 5 DAYS Performed at [facility], [place] [address], [place], [place] [address]    Report Status 09/10/2018 FINAL  Final  Urine C&S     Status: None   Collection Time: 09/05/18  1:22 PM  Result Value Ref Range Status   Specimen Description URINE, RANDOM  Final   Special Requests NONE  Final   Culture   Final    NO GROWTH Performed at [facility], [place] [address] [place], [place] [address]    Report Status 09/06/2018 FINAL  Final  MRSA PCR Screening     Status: None   Collection Time: 09/14/18  7:50 PM  Result Value Ref Range Status   MRSA by PCR NEGATIVE NEGATIVE Final    Comment:        The GeneXpert MRSA Assay (FDA approved for NASAL specimens only), is one component of a comprehensive MRSA colonization surveillance program. It is not intended to diagnose MRSA infection nor to guide or monitor treatment for MRSA infections. Performed at [facility], [place] [address], [place], [place] [address]   MRSA PCR  Screening     Status: Abnormal   Collection Time: 09/20/18  5:17 PM  Result Value Ref Range Status   MRSA by PCR POSITIVE (A) NEGATIVE Final    Comment:        The GeneXpert MRSA Assay (  FDA approved for NASAL specimens only), is one component of a comprehensive MRSA colonization surveillance program. It is not intended to diagnose MRSA infection nor to guide or monitor treatment for MRSA infections. RESULT CALLED TO, READ BACK BY AND VERIFIED WITH: Felipa Evener RN [id number] 09/20/2018 HILL K Performed at [facility], [place] [address] [place], [place] [address]     Studies/Results: Dg Chest 1 View  Result Date: 09/22/2018 CLINICAL DATA:  Hypoxia.  Left nephrectomy today EXAM: CHEST  1 VIEW COMPARISON:  CT chest 09/05/2018.  CT abdomen 09/15/2018 FINDINGS: Right jugular central venous catheter tip in the lower SVC. No pneumothorax. Right lung clear Left lower lobe effusion and consolidation/atelectasis unchanged from CT of 09/15/2018. No pneumothorax on the left. Heart size normal. IMPRESSION: Left lower lobe atelectasis and effusion unchanged from CT of 09/15/2018 Central venous catheter tip in the SVC.  No pneumothorax. Electronically Signed   By: Franchot Gallo M.D.   On: 09/22/2018 15:13    Assessment: Left renal mass status post left radical nephrectomy  Procedure(s): NEPHRECTOMY, 2 Days Post-Op  doing well.  Plan: Discontinue Foley catheter Discontinue IV fluids Move to regular floor Physical therapy, ambulate Recommend Lovenox but she has been refusing, SCDs Increase Dilaudid to 4 mg given her continued postoperative pain Continue JP drain.  May check and rule out lymphatic leak or pancreatic leak Advance diet to regular diet    Link Snuffer, MD Urology 09/24/2018, 8:02 AM

## 2018-09-25 LAB — BASIC METABOLIC PANEL
Anion gap: 10 (ref 5–15)
BUN: 22 mg/dL — AB (ref 6–20)
CALCIUM: 8 mg/dL — AB (ref 8.9–10.3)
CO2: 26 mmol/L (ref 22–32)
CREATININE: 1.2 mg/dL — AB (ref 0.44–1.00)
Chloride: 101 mmol/L (ref 98–111)
GFR, EST AFRICAN AMERICAN: 56 mL/min — AB (ref >60)
GFR, EST NON AFRICAN AMERICAN: 48 mL/min — AB (ref >60)
Glucose, Bld: 97 mg/dL (ref 70–99)
Potassium: 4.1 mmol/L (ref 3.5–5.1)
SODIUM: 137 mmol/L (ref 135–145)

## 2018-09-25 LAB — CBC
HCT: 32 % — ABNORMAL LOW (ref 36.0–46.0)
Hemoglobin: 10.3 g/dL — ABNORMAL LOW (ref 12.0–15.0)
MCH: 29 pg (ref 26.0–34.0)
MCHC: 32.2 g/dL (ref 30.0–36.0)
MCV: 90.1 fL (ref 80.0–100.0)
NRBC: 0 % (ref 0.0–0.2)
PLATELETS: 305 10*3/uL (ref 150–400)
RBC: 3.55 MIL/uL — ABNORMAL LOW (ref 3.87–5.11)
RDW: 14.6 % (ref 11.5–15.5)
WBC: 16.9 10*3/uL — AB (ref 4.0–10.5)

## 2018-09-25 MED ORDER — METOPROLOL TARTRATE 25 MG PO TABS: 12.5000 mg | ORAL_TABLET | Freq: Two times a day (BID) | ORAL | 1 refills | Status: DC

## 2018-09-25 MED ORDER — OXYCODONE HCL 10 MG PO TABS: 10.0000 mg | ORAL_TABLET | ORAL | 0 refills | Status: DC | PRN

## 2018-09-25 NOTE — Progress Notes (Signed)
Loss of IV access, per pt IV team consult ordered. IV team RN in room pt upset and stating "I don't want to do this anymore"  Pt has no IV access at this time. Pt willing to try again tomorrow.

## 2018-09-25 NOTE — Care Management Note (Signed)
Case Management Note  Patient Details  Name: Sharon Henson MRN: [id number] Date of Birth: Oct 29, 1961  Subjective/Objective:                    Action/Plan: Pt declined HHPT at present time. Pt states that she is able to walk and get OOB on her own.    Expected Discharge Date:                  Expected Discharge Plan:  Home/Self Care  In-House Referral:     Discharge planning Services  CM Consult  Post Acute Care Choice:    Choice offered to:     DME Arranged:    DME Agency:     HH Arranged:    [place] Agency:     Status of Service:  Completed, signed off  If discussed at [employer] of Stay Meetings, dates discussed:    Additional CommentsPurcell Mouton, RN 09/25/2018, 4:09 PM

## 2018-09-25 NOTE — Progress Notes (Signed)
PROGRESS NOTE    Sharon Henson  BWI:[id number] DOB: 30-Jul-1961 DOA: 09/05/2018 PCP: Lin Landsman, MD   Brief Narrative: Sharon Henson is a 57 y.o. female with a history of tobacco use. She was found to have a retroperitoneal hematoma in addition to AKI. Imaging showed left renal mass for which she is s/p left nephrectomy. AKI improved, but now slightly trending up.   Assessment & Plan:   Principal Problem:   Retroperitoneal hematoma Active Problems:   Left renal mass   Pilar Plate hematuria   Acute renal failure (HCC)   Acute blood loss anemia   Retroperitoneal hematoma In setting of left renal mass. S/p nephrectomy. -Urology  Acute kidney injury ATN secondary to blood loss anemia. Mild trend up likely in setting of recent surgery. Trending back down.   Elevated blood pressure Started on metoprolol. Well controlled. -Continue metoprolol for now, but may discontinue as blood pressure may have been elevated secondary to pain.  Right sided chest pain Reproducible. Resolved.  Macular rash Unknown etiology. Has hydrocortisone on board. Itchy. -Continue hydrocortisone cream  Leukocytosis No overt evidence of infection. Stable. Afebrile.  Sinus tachycardia Asymptomatic. On metoprolol  Recommendations: -Continue metoprolol on discharge. -Nephrology follow-up and outpatient recheck of BMP -Follow-up with PCP for rash  Will sign off. Please re-consult if further questions.   DVT prophylaxis: Lovenox Code Status:   Code Status: Full Code Family Communication: None Disposition Plan: Discharge per primary   Procedures:   Left nephrectomy  Antimicrobials:  Ancef    Subjective: Some abdominal pain.  Objective: Vitals:   09/24/18 1015 09/24/18 1326 09/24/18 2130 09/25/18 0452  BP: (!) 145/80 128/78 125/80 124/78  Pulse: 70 75 99 95  Resp: 18 20 20 18   Temp: (!) 97.5 F (36.4 C) 98 F (36.7 C) 98.3 F (36.8 C) 98.4 F (36.9 C)  TempSrc:  Oral Oral Oral    SpO2: 97% 95% 99% 95%  Weight:      Height:        Intake/Output Summary (Last 24 hours) at 09/25/2018 1323 Last data filed at 09/25/2018 1038 Gross per 24 hour  Intake 480 ml  Output 1890 ml  Net -1410 ml   Filed Weights   09/22/18 1045 09/23/18 0356 09/24/18 0425  Weight: 64 kg 58.8 kg 62.3 kg    Examination:  General exam: Appears calm and comfortable  Data Reviewed: I have personally reviewed following labs and imaging studies  CBC: Recent Labs  Lab 09/22/18 0321  09/22/18 2000 09/23/18 0911 09/24/18 0312 09/24/18 0824 09/25/18 0402  WBC 18.8*  --  15.0* 19.5* 15.8* 17.3* 16.9*  NEUTROABS 14.6*  --   --   --   --   --   --   HGB 10.0*   < > 11.0* 11.0* 9.9* 11.2* 10.3*  HCT 33.1*   < > 33.6* 34.3* 30.7* 34.8* 32.0*  MCV 95.1  --  89.1 90.5 89.5 89.7 90.1  PLT 308  --  235 261 279 294 305   < > = values in this interval not displayed.   Basic Metabolic Panel: Recent Labs  Lab 09/21/18 0356 09/22/18 0321  09/22/18 1409 09/22/18 2000 09/23/18 0809 09/24/18 0312 09/25/18 0402  NA  --  142   < > 139 139 138 138 137  K  --  3.9   < > 3.7 4.2 4.1 3.8 4.1  CL  --  96*  --   --  101 101 102 101  CO2  --  36*  --   --  31 29 30 26   GLUCOSE  --  120*   < > 168* 257* 136* 108* 97  BUN  --  15  --   --  16 18 20  22*  CREATININE  --  0.69  --   --  1.02* 1.09* 1.36* 1.20*  CALCIUM  --  9.0  --   --  7.7* 7.8* 8.0* 8.0*  MG 1.5*  --   --   --   --   --   --   --    < > = values in this interval not displayed.   GFR: Estimated Creatinine Clearance: 41.6 mL/min (A) (by C-G formula based on SCr of 1.2 mg/dL (H)). Liver Function Tests: No results for input(s): AST, ALT, ALKPHOS, BILITOT, PROT, ALBUMIN in the last 168 hours. No results for input(s): LIPASE, AMYLASE in the last 168 hours. No results for input(s): AMMONIA in the last 168 hours. Coagulation Profile: No results for input(s): INR, PROTIME in the last 168 hours. Cardiac Enzymes: No results for  input(s): CKTOTAL, CKMB, CKMBINDEX, TROPONINI in the last 168 hours. BNP (last 3 results) No results for input(s): PROBNP in the last 8760 hours. HbA1C: No results for input(s): HGBA1C in the last 72 hours. CBG: No results for input(s): GLUCAP in the last 168 hours. Lipid Profile: No results for input(s): CHOL, HDL, LDLCALC, TRIG, CHOLHDL, LDLDIRECT in the last 72 hours. Thyroid Function Tests: No results for input(s): TSH, T4TOTAL, FREET4, T3FREE, THYROIDAB in the last 72 hours. Anemia Panel: No results for input(s): VITAMINB12, FOLATE, FERRITIN, TIBC, IRON, RETICCTPCT in the last 72 hours. Sepsis Labs: No results for input(s): PROCALCITON, LATICACIDVEN in the last 168 hours.  Recent Results (from the past 240 hour(s))  MRSA PCR Screening     Status: Abnormal   Collection Time: 09/20/18  5:17 PM  Result Value Ref Range Status   MRSA by PCR POSITIVE (A) NEGATIVE Final    Comment:        The GeneXpert MRSA Assay (FDA approved for NASAL specimens only), is one component of a comprehensive MRSA colonization surveillance program. It is not intended to diagnose MRSA infection nor to guide or monitor treatment for MRSA infections. RESULT CALLED TO, READ BACK BY AND VERIFIED WITH: Felipa Evener RN [id number] 09/20/2018 HILL K Performed at [facility], [place] [address], [place], [place] [address]          Radiology Studies: No results found.      Scheduled Meds: . enoxaparin (LOVENOX) injection  30 mg Subcutaneous Q24H  . feeding supplement (ENSURE ENLIVE)  237 mL Oral BID BM  . mouth rinse  15 mL Mouth Rinse BID  . metoprolol tartrate  12.5 mg Oral BID  . multivitamin with minerals  1 tablet Oral Daily  . mupirocin ointment  1 application Nasal BID   Continuous Infusions:    LOS: 20 days     Cordelia Poche, MD [facility] 09/25/2018, 1:23 PM  If 7PM-7AM, please contact night-coverage [email or web address]

## 2018-09-25 NOTE — Progress Notes (Signed)
Urology Inpatient Progress Report  Dehydration [E86.0] Left renal mass [N28.89] Pilar Plate hematuria [R31.0] Sepsis, due to unspecified organism [A41.9] Acute renal failure, unspecified acute renal failure type ([place]) [N17.9]  Procedure(s): NEPHRECTOMY  3 Days Post-Op   Intv/Subj: No acute events overnight.drain continues to put out milky fluid.  Output seems to be slowing down but continues.  Her pain is still present but she states she is getting better.  She got out of bed yesterday and walk around the room some but still having problems with ambulation.  Principal Problem:   Retroperitoneal hematoma Active Problems:   Left renal mass   Pilar Plate hematuria   Acute renal failure (HCC)   Acute blood loss anemia  Current Facility-Administered Medications  Medication Dose Route Frequency Provider Last Rate Last Dose  . calcium carbonate (TUMS - dosed in mg elemental calcium) chewable tablet 200 mg of elemental calcium  1 tablet Oral Q4H PRN Marton Redwood III, MD   200 mg of elemental calcium at 09/25/18 0808  . enoxaparin (LOVENOX) injection 30 mg  30 mg Subcutaneous Q24H Marton Redwood III, MD      . feeding supplement (ENSURE ENLIVE) (ENSURE ENLIVE) liquid 237 mL  237 mL Oral BID BM Marton Redwood III, MD   237 mL at 09/24/18 1606  . hydrocortisone cream 1 % 1 application  1 application Topical TID PRN Lucas Mallow, MD   1 application at 62/70/35 [phone]  . HYDROmorphone (DILAUDID) injection 1-2 mg  1-2 mg Intravenous Q3H PRN Marton Redwood III, MD   2 mg at 09/24/18 0609  . Influenza vac split quadrivalent PF (FLUARIX) injection 0.5 mL  0.5 mL Intramuscular Prior to discharge Marton Redwood III, MD      . labetalol (NORMODYNE,TRANDATE) injection 10 mg  10 mg Intravenous Q2H PRN Marton Redwood III, MD   10 mg at 09/21/18 1810  . MEDLINE mouth rinse  15 mL Mouth Rinse BID Marton Redwood III, MD   15 mL at 09/24/18 2225  . metoprolol tartrate (LOPRESSOR) tablet 12.5 mg  12.5 mg Oral BID  Marton Redwood III, MD   12.5 mg at 09/24/18 2135  . multivitamin with minerals tablet 1 tablet  1 tablet Oral Daily Marton Redwood III, MD   1 tablet at 09/24/18 1023  . mupirocin ointment (BACTROBAN) 2 % 1 application  1 application Nasal BID Lucas Mallow, MD   1 application at 81/82/99 2139  . oxyCODONE (Oxy IR/ROXICODONE) immediate release tablet 10 mg  10 mg Oral Q3H PRN Marton Redwood III, MD   10 mg at 09/25/18 0759  . senna-docusate (Senokot-S) tablet 1 tablet  1 tablet Oral QHS PRN Lucas Mallow, MD   1 tablet at 09/24/18 2137  . simethicone (MYLICON) chewable tablet 80 mg  80 mg Oral Q6H PRN Marton Redwood III, MD   80 mg at 09/24/18 2225  . sodium chloride flush (NS) 0.9 % injection 10-40 mL  10-40 mL Intracatheter PRN Marton Redwood III, MD   10 mL at 09/19/18 0500     Objective: Vital: Vitals:   09/24/18 1015 09/24/18 1326 09/24/18 2130 09/25/18 0452  BP: (!) 145/80 128/78 125/80 124/78  Pulse: 70 75 99 95  Resp: 18 20 20 18   Temp: (!) 97.5 F (36.4 C) 98 F (36.7 C) 98.3 F (36.8 C) 98.4 F (36.9 C)  TempSrc:  Oral Oral Oral  SpO2: 97% 95% 99%  95%  Weight:      Height:       I/Os: I/O last 3 completed shifts: In: 1392.3 [P.O.:820; I.V.:473.5; IV Piggyback:98.7] Out: 3240 [Urine:2050; Drains:1190]  Physical Exam:  General: Patient is in no apparent distress Lungs: Normal respiratory effort, chest expands symmetrically. GI: Incisions are c/d/i. The abdomen is soft and moderately but not overly tender without mass. JP drain with milky drainag Ext: lower extremities symmetric  Lab Results: Recent Labs    09/24/18 0312 09/24/18 0824 09/25/18 0402  WBC 15.8* 17.3* 16.9*  HGB 9.9* 11.2* 10.3*  HCT 30.7* 34.8* 32.0*   Recent Labs    09/23/18 0809 09/24/18 0312 09/25/18 0402  NA 138 138 137  K 4.1 3.8 4.1  CL 101 102 101  CO2 29 30 26   GLUCOSE 136* 108* 97  BUN 18 20 22*  CREATININE 1.09* 1.36* 1.20*  CALCIUM 7.8* 8.0* 8.0*   No results  for input(s): LABPT, INR in the last 72 hours. No results for input(s): LABURIN in the last 72 hours. Results for orders placed or performed during the hospital encounter of 09/05/18  Blood Culture (routine x 2)     Status: None   Collection Time: 09/05/18  9:44 AM  Result Value Ref Range Status   Specimen Description BLOOD WEAKLY REACTIVE LEFT  Final   Special Requests   Final    BOTTLES DRAWN AEROBIC ONLY Blood Culture results may not be optimal due to an inadequate volume of blood received in culture bottles   Culture   Final    NO GROWTH 5 DAYS Performed at [facility], [place] [address], [place], [place] [address]    Report Status 09/10/2018 FINAL  Final  Blood Culture (routine x 2)     Status: None   Collection Time: 09/05/18  9:49 AM  Result Value Ref Range Status   Specimen Description BLOOD LEFT HAND  Final   Special Requests   Final    BOTTLES DRAWN AEROBIC ONLY Blood Culture results may not be optimal due to an inadequate volume of blood received in culture bottles   Culture   Final    NO GROWTH 5 DAYS Performed at [facility], [place] [address] [place], [place] [address]    Report Status 09/10/2018 FINAL  Final  Urine C&S     Status: None   Collection Time: 09/05/18  1:22 PM  Result Value Ref Range Status   Specimen Description URINE, RANDOM  Final   Special Requests NONE  Final   Culture   Final    NO GROWTH Performed at [facility], [place] [address] [place], [place] [address]    Report Status 09/06/2018 FINAL  Final  MRSA PCR Screening     Status: None   Collection Time: 09/14/18  7:50 PM  Result Value Ref Range Status   MRSA by PCR NEGATIVE NEGATIVE Final    Comment:        The GeneXpert MRSA Assay (FDA approved for NASAL specimens only), is one component of a comprehensive MRSA colonization surveillance program. It is not intended to diagnose MRSA infection nor to guide or monitor treatment for MRSA infections. Performed at [facility], [place] [address], [place],  [address]   MRSA PCR Screening     Status: Abnormal   Collection Time: 09/20/18  5:17 PM  Result Value Ref Range Status   MRSA by PCR POSITIVE (A) NEGATIVE Final    Comment:  The GeneXpert MRSA Assay (FDA approved for NASAL specimens only), is one component of a comprehensive MRSA colonization surveillance program. It is not intended to diagnose MRSA infection nor to guide or monitor treatment for MRSA infections. RESULT CALLED TO, READ BACK BY AND VERIFIED WITH: Felipa Evener RN [id number] 09/20/2018 HILL K Performed at [facility], [place] [address] [place], [place] [address]     Studies/Results: No results found.  Assessment: Left renal mass with retroperitoneal hematoma status post radical nephrectomy  Procedure(s): NEPHRECTOMY, 3 Days Post-Op  doing well.  Plan: General diet for now.  May need to switch to high protein, low-fat diet with medium chain fatty acid if she has a chylous leak Check JP drain for triglycerides, amylase, lipase No IV fluids needed Continues to refuse DVT prophylaxis except for SCDs.  Continue physical therapy, home health physical therapy is recommended for when she goes home Continue pain control with 10 mg oxycodone every 3 hours. No more labs necessary.  Creatinine has stabilized and hemoglobin is stable. Ambulation very important. Anticipate home soon but may end up going home with the JP drain depending on the findings.   Sharon Snuffer, MD Urology 09/25/2018, 9:49 AM

## 2018-09-25 NOTE — Discharge Instructions (Signed)
·   You will be going home with a drain.  Be sure to empty it whenever the drain fills up or at the end of the day.  Be sure to record the entire total days output on a piece of paper and keep a trend for your physician to review at your follow-up appointment  Activity:  You are encouraged to ambulate frequently (about every hour during waking hours) to help prevent blood clots from forming in your legs or lungs.  However, you should not engage in any heavy lifting (> 10-15 lbs), strenuous activity, or straining.   Diet: You should advance your diet as instructed by your physician.  It will be normal to have some bloating, nausea, and abdominal discomfort intermittently.   Prescriptions:  You will be provided a prescription for pain medication to take as needed.  If your pain is not severe enough to require the prescription pain medication, you may take extra strength Tylenol instead which will have less side effects.  You should also take a prescribed stool softener to avoid straining with bowel movements as the prescription pain medication may constipate you.   Incisions: You may remove your dressing bandages 48 hours after surgery if not removed in the hospital.  You will either have some small staples or special tissue glue at each of the incision sites. Once the bandages are removed (if present), the incisions may stay open to air.  You may start showering (but not soaking or bathing in water) the 2nd day after surgery and the incisions simply need to be patted dry after the shower.  No additional care is needed.   What to call us about: You should call the office [phone]) if you develop fever > 101 or develop persistent vomiting. Activity:  You are encouraged to ambulate frequently (about every hour during waking hours) to help prevent blood clots from forming in your legs or lungs.  However, you should not engage in any heavy lifting (> 10-15 lbs), strenuous activity, or straining.

## 2018-09-25 NOTE — Progress Notes (Signed)
Physical Therapy Treatment Patient Details Name: Sharon Henson MRN: [id number] DOB: [birth year] Today's Date: 09/25/2018    History of Present Illness 57 yo female admitted with retroperitoneal hematoma, anemia, ARF. s/p open L radical nephrectomy 10/15    PT Comments    Pt assisted to [facility] prior to ambulating.  Assisted pt with donning abdominal binder which pt reports improves pain.  Pt ambulated short distance in hallway.  Pt encouraged to ambulate with nursing staff in addition to PT.   Follow Up Recommendations  Home health PT;Supervision/Assistance - 24 hour     Equipment Recommendations  Rolling walker with 5" wheels    Recommendations for Other Services       Precautions / Restrictions Precautions Precautions: Fall Precaution Comments: L JP drain.  Will need mesh panties and a pad for leakage Required Braces or Orthoses: Other Brace/Splint Other Brace/Splint: abdominal binder for comfort    Mobility  Bed Mobility Overal bed mobility: Needs Assistance Bed Mobility: Supine to Sit     Supine to sit: HOB elevated;Min assist     General bed mobility comments: Increased time. Assist for trunk. pt hugged pillow for pain control  Transfers Overall transfer level: Needs assistance Equipment used: Rolling walker (2 wheeled) Transfers: Sit to/from Stand Sit to Stand: Min assist         General transfer comment: slight assist to rise, verbal cues for hand placement, increased time  Ambulation/Gait Ambulation/Gait assistance: Min guard Gait Distance (Feet): 80 Feet Assistive device: Rolling walker (2 wheeled) Gait Pattern/deviations: Trunk flexed;Step-through pattern;Decreased stride length     General Gait Details: encouraged improved posture, pt tends to perform forward trunk flexion due to pain, distance to tolerance   Stairs             Wheelchair Mobility    Modified Rankin (Stroke Patients Only)       Balance                                             Cognition Arousal/Alertness: Awake/alert Behavior During Therapy: WFL for tasks assessed/performed Overall Cognitive Status: Within Functional Limits for tasks assessed                                        Exercises      General Comments        Pertinent Vitals/Pain Pain Assessment: Faces Faces Pain Scale: Hurts whole lot Pain Location: abdomen Pain Descriptors / Indicators: Discomfort;Grimacing;Operative site guarding Pain Intervention(s): Limited activity within patient's tolerance;Repositioned;Monitored during session;RN gave pain meds during session    Home Living                      Prior Function            PT Goals (current goals can now be found in the care plan section) Progress towards PT goals: Progressing toward goals    Frequency    Min 3X/week      PT Plan Current plan remains appropriate    Co-evaluation              AM-PAC PT "6 Clicks" Daily Activity  Outcome Measure  Difficulty turning over in bed (including adjusting bedclothes, sheets and blankets)?: Unable Difficulty moving from lying on back to  sitting on the side of the bed? : Unable Difficulty sitting down on and standing up from a chair with arms (e.g., wheelchair, bedside commode, etc,.)?: Unable Help needed moving to and from a bed to chair (including a wheelchair)?: A Little Help needed walking in hospital room?: A Little Help needed climbing 3-5 steps with a railing? : A Lot 6 Click Score: 11    End of Session   Activity Tolerance: Patient tolerated treatment well Patient left: in bed;with call bell/phone within reach;with family/visitor present;with nursing/sitter in room(sitting EOB with RN)   PT Visit Diagnosis: Muscle weakness (generalized) (M62.81);Difficulty in walking, not elsewhere classified (R26.2)     Time: 9163-8466 PT Time Calculation (min) (ACUTE ONLY): 22 min  Charges:  $Gait Training: 8-22  mins                    Carmelia Bake, PT, DPT Acute Rehabilitation Services Office: [phone] Pager: [phone]   Trena Platt 09/25/2018, 2:21 PM

## 2018-09-26 LAB — AMYLASE, PLEURAL OR PERITONEAL FLUID: AMYLASE FL: 40 U/L

## 2018-09-26 LAB — TRIGLYCERIDES, BODY FLUIDS: TRIGLYCERIDES FL: 588 mg/dL

## 2018-09-26 NOTE — Progress Notes (Signed)
Urology Inpatient Progress Report  Dehydration [E86.0] Left renal mass [N28.89] Pilar Plate hematuria [R31.0] Sepsis, due to unspecified organism [A41.9] Acute renal failure, unspecified acute renal failure type ([place]) [N17.9]  Procedure(s): NEPHRECTOMY  4 Days Post-Op   Intv/Subj: No acute events overnight.drain continues to put out milky fluid, came back at triglycerides.   Her pain is still present but she states she is getting better.  She got out of bed yesterday and walk around the room some but still having problems with ambulation. Refusing home health PT  Principal Problem:   Retroperitoneal hematoma Active Problems:   Left renal mass   Pilar Plate hematuria   Acute renal failure (HCC)   Acute blood loss anemia  Current Facility-Administered Medications  Medication Dose Route Frequency Provider Last Rate Last Dose  . calcium carbonate (TUMS - dosed in mg elemental calcium) chewable tablet 200 mg of elemental calcium  1 tablet Oral Q4H PRN Marton Redwood III, MD   200 mg of elemental calcium at 09/26/18 1500  . enoxaparin (LOVENOX) injection 30 mg  30 mg Subcutaneous Q24H Marton Redwood III, MD      . feeding supplement (ENSURE ENLIVE) (ENSURE ENLIVE) liquid 237 mL  237 mL Oral BID BM Marton Redwood III, MD   237 mL at 09/26/18 1407  . hydrocortisone cream 1 % 1 application  1 application Topical TID PRN Lucas Mallow, MD   1 application at 89/21/19 [phone]  . HYDROmorphone (DILAUDID) injection 1-2 mg  1-2 mg Intravenous Q3H PRN Marton Redwood III, MD   2 mg at 09/24/18 0609  . Influenza vac split quadrivalent PF (FLUARIX) injection 0.5 mL  0.5 mL Intramuscular Prior to discharge Marton Redwood III, MD      . labetalol (NORMODYNE,TRANDATE) injection 10 mg  10 mg Intravenous Q2H PRN Marton Redwood III, MD   10 mg at 09/21/18 1810  . MEDLINE mouth rinse  15 mL Mouth Rinse BID Marton Redwood III, MD   15 mL at 09/26/18 1046  . metoprolol tartrate (LOPRESSOR) tablet 12.5 mg  12.5 mg Oral  BID Marton Redwood III, MD   12.5 mg at 09/26/18 0929  . multivitamin with minerals tablet 1 tablet  1 tablet Oral Daily Marton Redwood III, MD   1 tablet at 09/26/18 0929  . oxyCODONE (Oxy IR/ROXICODONE) immediate release tablet 10 mg  10 mg Oral Q3H PRN Marton Redwood III, MD   10 mg at 09/26/18 1405  . senna-docusate (Senokot-S) tablet 1 tablet  1 tablet Oral QHS PRN Lucas Mallow, MD   1 tablet at 09/24/18 2137  . simethicone (MYLICON) chewable tablet 80 mg  80 mg Oral Q6H PRN Marton Redwood III, MD   80 mg at 09/26/18 0929  . sodium chloride flush (NS) 0.9 % injection 10-40 mL  10-40 mL Intracatheter PRN Marton Redwood III, MD   10 mL at 09/19/18 0500     Objective: Vital: Vitals:   09/25/18 1419 09/25/18 2107 09/26/18 0413 09/26/18 1417  BP: 138/90 129/87 130/81 127/79  Pulse: 96 (!) 109 96 92  Resp: (!) 22 18 18 18   Temp: 97.9 F (36.6 C) 98.4 F (36.9 C) 98.3 F (36.8 C) 98.5 F (36.9 C)  TempSrc: Oral Oral Oral Oral  SpO2: 99% 95% 100% 96%  Weight:      Height:       I/Os: I/O last 3 completed shifts: In: 802 [P.O.:802] Out: 3230 [  ZOXWR:6045; Drains:1280]  Physical Exam:  General: Patient is in no apparent distress Lungs: Normal respiratory effort, chest expands symmetrically. GI: Incisions are c/d/i. The abdomen is soft and moderately but not overly tender without mass. JP drain with milky drainag Ext: lower extremities symmetric  Lab Results: Recent Labs    09/24/18 0312 09/24/18 0824 09/25/18 0402  WBC 15.8* 17.3* 16.9*  HGB 9.9* 11.2* 10.3*  HCT 30.7* 34.8* 32.0*   Recent Labs    09/24/18 0312 09/25/18 0402  NA 138 137  K 3.8 4.1  CL 102 101  CO2 30 26  GLUCOSE 108* 97  BUN 20 22*  CREATININE 1.36* 1.20*  CALCIUM 8.0* 8.0*   No results for input(s): LABPT, INR in the last 72 hours. No results for input(s): LABURIN in the last 72 hours. Results for orders placed or performed during the hospital encounter of 09/05/18  Blood Culture  (routine x 2)     Status: None   Collection Time: 09/05/18  9:44 AM  Result Value Ref Range Status   Specimen Description BLOOD WEAKLY REACTIVE LEFT  Final   Special Requests   Final    BOTTLES DRAWN AEROBIC ONLY Blood Culture results may not be optimal due to an inadequate volume of blood received in culture bottles   Culture   Final    NO GROWTH 5 DAYS Performed at [facility], [place] [address], [place], [place] [address]    Report Status 09/10/2018 FINAL  Final  Blood Culture (routine x 2)     Status: None   Collection Time: 09/05/18  9:49 AM  Result Value Ref Range Status   Specimen Description BLOOD LEFT HAND  Final   Special Requests   Final    BOTTLES DRAWN AEROBIC ONLY Blood Culture results may not be optimal due to an inadequate volume of blood received in culture bottles   Culture   Final    NO GROWTH 5 DAYS Performed at [facility], [place] [address], [place], [place] [address]    Report Status 09/10/2018 FINAL  Final  Urine C&S     Status: None   Collection Time: 09/05/18  1:22 PM  Result Value Ref Range Status   Specimen Description URINE, RANDOM  Final   Special Requests NONE  Final   Culture   Final    NO GROWTH Performed at [facility], [place] [address], [place], [place] [address]    Report Status 09/06/2018 FINAL  Final  MRSA PCR Screening     Status: None   Collection Time: 09/14/18  7:50 PM  Result Value Ref Range Status   MRSA by PCR NEGATIVE NEGATIVE Final    Comment:        The GeneXpert MRSA Assay (FDA approved for NASAL specimens only), is one component of a comprehensive MRSA colonization surveillance program. It is not intended to diagnose MRSA infection nor to guide or monitor treatment for MRSA infections. Performed at [facility], [place] [address] [place],  [address]   MRSA PCR Screening     Status: Abnormal   Collection Time: 09/20/18  5:17 PM  Result Value Ref Range Status   MRSA by PCR POSITIVE (A) NEGATIVE  Final    Comment:        The GeneXpert MRSA Assay (FDA approved for NASAL specimens only), is one component of a comprehensive MRSA colonization surveillance program. It is not intended to diagnose MRSA infection nor to guide or  monitor treatment for MRSA infections. RESULT CALLED TO, READ BACK BY AND VERIFIED WITH: Felipa Evener RN [phone] 09/20/2018 HILL K Performed at [facility], [place] [address] [place], [place] [address]     Studies/Results: No results found.  Assessment: Left renal mass with retroperitoneal hematoma status post radical nephrectomy  Procedure(s): NEPHRECTOMY, 4 Days Post-Op  doing well.  Plan: High protein, low fat diet No IV fluids needed Continues to refuse DVT prophylaxis except for SCDs.  Continue physical therapy, home health physical therapy is recommended for when she goes home Continue pain control with 10 mg oxycodone every 3 hours. No more labs necessary.  Creatinine has stabilized and hemoglobin is stable. Likely discharge tomorrow   Link Snuffer, MD Urology 09/26/2018, 3:45 PM

## 2018-09-27 NOTE — Progress Notes (Signed)
Physical Therapy Treatment Patient Details Name: Sharon Henson MRN: [id number] DOB: 1961-06-08 Today's Date: 09/27/2018    History of Present Illness 57 yo female admitted with retroperitoneal hematoma, anemia, ARF. s/p open L radical nephrectomy 10/15    PT Comments    Pt is progressing, encouraged to continue to amb with nursing staff; amb short distance without RW,  incr pain and more min assist without UE support--needs RW but currently unsure if she is agreeable to getting one; husband present for session today; continue PT POC  Follow Up Recommendations  No PT follow up     Equipment Recommendations  Rolling walker with 5" wheels(pt needs Rw, unsure if agreeable)    Recommendations for Other Services       Precautions / Restrictions Precautions Precautions: Fall Precaution Comments: L JP drain Required Braces or Orthoses: Other Brace/Splint Other Brace/Splint: abdominal binder for comfort    Mobility  Bed Mobility Overal bed mobility: Needs Assistance Bed Mobility: Rolling;Sidelying to Sit;Sit to Sidelying Rolling: Supervision Sidelying to sit: Min assist     Sit to sidelying: Min assist General bed mobility comments: Increased time.  light Assist for trunk to upright, assist with LEs onto bed, cues for log roll technqiue; bed flat  Transfers Overall transfer level: Needs assistance Equipment used: Rolling walker (2 wheeled) Transfers: Sit to/from Stand Sit to Stand: Min guard;Supervision         General transfer comment: cues for hand placement, to power up/down  with LEs  Ambulation/Gait Ambulation/Gait assistance: Min guard;Supervision Gait Distance (Feet): 220 Feet Assistive device: Rolling walker (2 wheeled);None Gait Pattern/deviations: Trunk flexed;Step-through pattern;Decreased stride length     General Gait Details: verbal and tactile cues for trunk extension, amb 180' with RW and supervision,  amb without assisted device and min/guard for  safety/balance   Stairs             Wheelchair Mobility    Modified Rankin (Stroke Patients Only)       Balance                                            Cognition Arousal/Alertness: Awake/alert Behavior During Therapy: WFL for tasks assessed/performed;Flat affect Overall Cognitive Status: Within Functional Limits for tasks assessed                                        Exercises      General Comments        Pertinent Vitals/Pain Pain Assessment: Faces Faces Pain Scale: Hurts little more Pain Location: abdomen Pain Descriptors / Indicators: Discomfort;Grimacing;Operative site guarding Pain Intervention(s): Limited activity within patient's tolerance;Monitored during session    Home Living                      Prior Function            PT Goals (current goals can now be found in the care plan section) Acute Rehab PT Goals Patient Stated Goal: less pain PT Goal Formulation: With patient Time For Goal Achievement: 10/08/18 Potential to Achieve Goals: Good Progress towards PT goals: Progressing toward goals    Frequency    Min 3X/week      PT Plan Current plan remains appropriate    Co-evaluation  AM-PAC PT "6 Clicks" Daily Activity  Outcome Measure  Difficulty turning over in bed (including adjusting bedclothes, sheets and blankets)?: Unable Difficulty moving from lying on back to sitting on the side of the bed? : A Lot Difficulty sitting down on and standing up from a chair with arms (e.g., wheelchair, bedside commode, etc,.)?: A Lot Help needed moving to and from a bed to chair (including a wheelchair)?: A Little Help needed walking in hospital room?: A Little Help needed climbing 3-5 steps with a railing? : A Lot 6 Click Score: 13    End of Session   Activity Tolerance: Patient tolerated treatment well Patient left: in bed;with call bell/phone within reach;with  family/visitor present   PT Visit Diagnosis: Muscle weakness (generalized) (M62.81);Difficulty in walking, not elsewhere classified (R26.2)     Time: 0321-2248 PT Time Calculation (min) (ACUTE ONLY): 16 min  Charges:  $Gait Training: 8-22 mins                     Kenyon Ana, PT  Pager: [phone] Acute Rehab Dept [facility]): [phone]   09/27/2018    [facility] 09/27/2018, 2:55 PM

## 2018-09-27 NOTE — Progress Notes (Signed)
Urology Inpatient Progress Report  Dehydration [E86.0] Left renal mass [N28.89] Pilar Plate hematuria [R31.0] Sepsis, due to unspecified organism [A41.9] Acute renal failure, unspecified acute renal failure type ([place]) [N17.9]  Procedure(s): NEPHRECTOMY  5 Days Post-Op   Intv/Subj: No acute events overnight.drain continues to put out milky fluid, came back at triglycerides.   Principal Problem:   Retroperitoneal hematoma Active Problems:   Left renal mass   Pilar Plate hematuria   Acute renal failure (HCC)   Acute blood loss anemia  Current Facility-Administered Medications  Medication Dose Route Frequency Provider Last Rate Last Dose  . calcium carbonate (TUMS - dosed in mg elemental calcium) chewable tablet 200 mg of elemental calcium  1 tablet Oral Q4H PRN Marton Redwood III, MD   200 mg of elemental calcium at 09/27/18 1316  . enoxaparin (LOVENOX) injection 30 mg  30 mg Subcutaneous Q24H Marton Redwood III, MD      . feeding supplement (ENSURE ENLIVE) (ENSURE ENLIVE) liquid 237 mL  237 mL Oral BID BM Marton Redwood III, MD   237 mL at 09/27/18 1316  . hydrocortisone cream 1 % 1 application  1 application Topical TID PRN Lucas Mallow, MD   1 application at 23/55/73 [phone]  . HYDROmorphone (DILAUDID) injection 1-2 mg  1-2 mg Intravenous Q3H PRN Marton Redwood III, MD   2 mg at 09/24/18 0609  . Influenza vac split quadrivalent PF (FLUARIX) injection 0.5 mL  0.5 mL Intramuscular Prior to discharge Marton Redwood III, MD      . labetalol (NORMODYNE,TRANDATE) injection 10 mg  10 mg Intravenous Q2H PRN Marton Redwood III, MD   10 mg at 09/21/18 1810  . MEDLINE mouth rinse  15 mL Mouth Rinse BID Marton Redwood III, MD   15 mL at 09/27/18 [phone]  . metoprolol tartrate (LOPRESSOR) tablet 12.5 mg  12.5 mg Oral BID Marton Redwood III, MD   12.5 mg at 09/27/18 0623  . multivitamin with minerals tablet 1 tablet  1 tablet Oral Daily Marton Redwood III, MD   1 tablet at 09/27/18 [phone]  . oxyCODONE (Oxy  IR/ROXICODONE) immediate release tablet 10 mg  10 mg Oral Q3H PRN Marton Redwood III, MD   10 mg at 09/27/18 1638  . senna-docusate (Senokot-S) tablet 1 tablet  1 tablet Oral QHS PRN Lucas Mallow, MD   1 tablet at 09/24/18 2137  . simethicone (MYLICON) chewable tablet 80 mg  80 mg Oral Q6H PRN Marton Redwood III, MD   80 mg at 09/27/18 1643  . sodium chloride flush (NS) 0.9 % injection 10-40 mL  10-40 mL Intracatheter PRN Marton Redwood III, MD   10 mL at 09/19/18 0500     Objective: Vital: Vitals:   09/26/18 2019 09/27/18 0155 09/27/18 0422 09/27/18 1300  BP: 126/86 134/75 132/87 124/86  Pulse: 74 96 82 93  Resp: 18  18 17   Temp: 98.2 F (36.8 C)  98.5 F (36.9 C) 98.7 F (37.1 C)  TempSrc: Oral  Oral Oral  SpO2: 96%  95% 99%  Weight:      Height:       I/Os: I/O last 3 completed shifts: In: 1024 [P.O.:1024] Out: 3810 [Urine:2950; Drains:860]  Physical Exam:  General: Patient is in no apparent distress Lungs: Normal respiratory effort, chest expands symmetrically. GI: Incisions are c/d/i. The abdomen is soft and moderately but not overly tender without mass. JP drain with milky drainag  Ext: lower extremities symmetric  Lab Results: Recent Labs    09/25/18 0402  WBC 16.9*  HGB 10.3*  HCT 32.0*   Recent Labs    09/25/18 0402  NA 137  K 4.1  CL 101  CO2 26  GLUCOSE 97  BUN 22*  CREATININE 1.20*  CALCIUM 8.0*   No results for input(s): LABPT, INR in the last 72 hours. No results for input(s): LABURIN in the last 72 hours. Results for orders placed or performed during the hospital encounter of 09/05/18  Blood Culture (routine x 2)     Status: None   Collection Time: 09/05/18  9:44 AM  Result Value Ref Range Status   Specimen Description BLOOD WEAKLY REACTIVE LEFT  Final   Special Requests   Final    BOTTLES DRAWN AEROBIC ONLY Blood Culture results may not be optimal due to an inadequate volume of blood received in culture bottles   Culture   Final     NO GROWTH 5 DAYS Performed at [facility], [place] [address] [place], [place] [address]    Report Status 09/10/2018 FINAL  Final  Blood Culture (routine x 2)     Status: None   Collection Time: 09/05/18  9:49 AM  Result Value Ref Range Status   Specimen Description BLOOD LEFT HAND  Final   Special Requests   Final    BOTTLES DRAWN AEROBIC ONLY Blood Culture results may not be optimal due to an inadequate volume of blood received in culture bottles   Culture   Final    NO GROWTH 5 DAYS Performed at [facility], [place] [address], [place], [place] [address]    Report Status 09/10/2018 FINAL  Final  Urine C&S     Status: None   Collection Time: 09/05/18  1:22 PM  Result Value Ref Range Status   Specimen Description URINE, RANDOM  Final   Special Requests NONE  Final   Culture   Final    NO GROWTH Performed at [facility], [place] [address], [place], [place] [address]    Report Status 09/06/2018 FINAL  Final  MRSA PCR Screening     Status: None   Collection Time: 09/14/18  7:50 PM  Result Value Ref Range Status   MRSA by PCR NEGATIVE NEGATIVE Final    Comment:        The GeneXpert MRSA Assay (FDA approved for NASAL specimens only), is one component of a comprehensive MRSA colonization surveillance program. It is not intended to diagnose MRSA infection nor to guide or monitor treatment for MRSA infections. Performed at [facility], [place] [address] [place], [place] [address]   MRSA PCR Screening     Status: Abnormal   Collection Time: 09/20/18  5:17 PM  Result Value Ref Range Status   MRSA by PCR POSITIVE (A) NEGATIVE Final    Comment:        The GeneXpert MRSA Assay (FDA approved for NASAL specimens only), is one component of a comprehensive MRSA colonization surveillance program. It is not intended to diagnose MRSA infection nor to guide or monitor treatment for MRSA infections. RESULT CALLED TO, READ BACK BY AND VERIFIED WITH: Felipa Evener RN [id number]  09/20/2018 HILL K Performed at [facility], [place] [address], [place],  [address]     Studies/Results: No results found.  Assessment: Left renal mass with retroperitoneal hematoma status post radical nephrectomy  Procedure(s): NEPHRECTOMY, 5 Days Post-Op  doing well.  Plan: Continue High protein, low fat diet Nutritionist to see patient No IV fluids needed Continues to refuse DVT prophylaxis except for SCDs.  Continue physical therapy, home health physical therapy is recommended for when she goes home Continue pain control with 10 mg oxycodone every 3 hours. No more labs necessary.  Creatinine has stabilized and hemoglobin is stable. Likely discharge tomorrow   Link Snuffer, MD Urology 09/27/2018, 5:39 PM

## 2018-09-28 MED ORDER — INFLUENZA VAC SPLIT QUAD 0.5 ML IM SUSY: 0.5000 mL | PREFILLED_SYRINGE | INTRAMUSCULAR | Status: DC | PRN

## 2018-09-28 NOTE — Discharge Summary (Signed)
Physician Discharge Summary  Patient ID: Sharon Henson MRN: [id number] DOB/AGE: [birth year] 57 y.o.  Admit date: 09/05/2018 Discharge date: 09/28/2018  Admission Diagnoses:  Discharge Diagnoses:  Principal Problem:   Retroperitoneal hematoma Active Problems:   Left renal mass   Pilar Plate hematuria   Acute renal failure (HCC)   Acute blood loss anemia   Discharged Condition: good  Hospital Course: 57 year old female initially admitted with left retroperitoneal bleed secondary to large left renal mass.  This was initially managed conservatively with blood transfusions as needed.  She initially stabilized but had a rebleed.  This prompted attempt at embolization which was unsuccessful.  She was initially experiencing acute renal insufficiency but subsequently recovered with this and did not require dialysis during the hospital stay.  Given that she had a repeat bleed, the decision was made to proceed with left radical nephrectomy prior to discharge.  This occurred on 09/22/2018.  Postoperatively, she was transferred to my service.  She recovered well from the procedure.  However, she did start to produce a callus leak.  This was confirmed by high triglycerides and the JP fluid.  Otherwise, patient was doing well.  Pain became well controlled on oxycodone 10 mg.  She was ambulating.  Home health physical therapy was recommended but she refused.  Of note, throughout her stay she also refused Lovenox DVT prophylaxis.  Otherwise, the patient has done well and is having bowel movements and tolerating a diet.  Consults: nephrology and Interventional radiology, internal medicine  Significant Diagnostic Studies: As above  Treatments: surgery: Left radical nephrectomy  Discharge Exam: Blood pressure (!) 134/91, pulse 79, temperature 98.5 F (36.9 C), temperature source Oral, resp. rate 18, height 5' (1.524 m), weight 62.3 kg, SpO2 97 %. General appearance: cooperative, no acute distress Adequate  perfusion of extremities Nonlabored respiration, symmetrical chest rise Abdomen is soft, mildly tender to palpation consistent with postoperative change, midline abdominal incision with staples in place and is clean dry and intact.  Disposition: Discharge disposition: 01-Home or Self Care        Allergies as of 09/28/2018   No Known Allergies     Medication List    TAKE these medications   metoprolol tartrate 25 MG tablet Commonly known as:  LOPRESSOR Take 0.5 tablets (12.5 mg total) by mouth 2 (two) times daily.   Oxycodone HCl 10 MG Tabs Take 1 tablet (10 mg total) by mouth every 3 (three) hours as needed for moderate pain or severe pain.      Follow-up Information    Justin Mend, MD Follow up in 6 week(s).   Specialty:  Internal Medicine Why:  We will call to set up appointment Contact information: [place] [address] [phone]           Signed: Marton Redwood, III 09/28/2018, 9:50 AM

## 2018-09-28 NOTE — Progress Notes (Signed)
Nutrition Education Note   RD received consult for education regarding high protein, low fat diet for chlye leak.   RD visited with pt in room today. Things discussed were as follows:  Foods to avoid:  Discussed with pt to avoid foods high in fat. Recommended pt limit fat intake to no more than 25 grams per day. Discouraged intake of fried foods, fatty meats, nuts, seeds, whole fat dairy, and oils. Encouraged pt to use fat free items and to use the nutrition label for determining fat quantity of items.   Foods recommended to eat:  Encouraged intake of high protein food items. Provided examples on ways to increase foods and beverages high in protein frequently consumed by the patient. Also provided ideas to promote variety and to incorporate an oral nutrition supplement while following this diet. Discussed eating small frequent meals and snacks to assist in increasing overall po intake. Encouraged the intake of a multivitamin to ensure pt meets micronutrient needs.   Mariana Single RD, LDN Clinical Nutrition Pager # [phone]

## 2018-09-29 LAB — LIPASE, FLUID: Lipase-Fluid: 21 U/L

## 2018-10-08 DIAGNOSIS — R8290 Unspecified abnormal findings in urine: Secondary | ICD-10-CM | POA: Diagnosis not present

## 2018-10-08 DIAGNOSIS — N3000 Acute cystitis without hematuria: Secondary | ICD-10-CM | POA: Diagnosis not present

## 2018-10-23 DIAGNOSIS — C642 Malignant neoplasm of left kidney, except renal pelvis: Secondary | ICD-10-CM | POA: Diagnosis not present

## 2018-10-30 ENCOUNTER — Encounter: Payer: Self-pay | Admitting: Family Medicine

## 2018-10-30 ENCOUNTER — Ambulatory Visit: Payer: BLUE CROSS/BLUE SHIELD | Admitting: Family Medicine

## 2018-10-30 VITALS — BP 126/90 | HR 84 | Temp 98.3°F | Resp 20 | Ht 60.0 in | Wt 99.2 lb

## 2018-10-30 DIAGNOSIS — Z7689 Persons encountering health services in other specified circumstances: Secondary | ICD-10-CM

## 2018-10-30 DIAGNOSIS — Z905 Acquired absence of kidney: Secondary | ICD-10-CM | POA: Diagnosis not present

## 2018-10-30 DIAGNOSIS — D649 Anemia, unspecified: Secondary | ICD-10-CM

## 2018-10-30 DIAGNOSIS — C642 Malignant neoplasm of left kidney, except renal pelvis: Secondary | ICD-10-CM

## 2018-10-30 DIAGNOSIS — I10 Essential (primary) hypertension: Secondary | ICD-10-CM | POA: Diagnosis not present

## 2018-10-30 LAB — CBC WITH DIFFERENTIAL/PLATELET
Basophils Absolute: 0.1 10*3/uL (ref 0.0–0.1)
Basophils Relative: 1.2 % (ref 0.0–3.0)
EOS PCT: 4.2 % (ref 0.0–5.0)
Eosinophils Absolute: 0.3 10*3/uL (ref 0.0–0.7)
HCT: 31.6 % — ABNORMAL LOW (ref 36.0–46.0)
Hemoglobin: 10.5 g/dL — ABNORMAL LOW (ref 12.0–15.0)
LYMPHS ABS: 0.9 10*3/uL (ref 0.7–4.0)
Lymphocytes Relative: 14.6 % (ref 12.0–46.0)
MCHC: 33.3 g/dL (ref 30.0–36.0)
MCV: 86.6 fl (ref 78.0–100.0)
MONO ABS: 0.5 10*3/uL (ref 0.1–1.0)
Monocytes Relative: 7.6 % (ref 3.0–12.0)
NEUTROS ABS: 4.6 10*3/uL (ref 1.4–7.7)
NEUTROS PCT: 72.4 % (ref 43.0–77.0)
PLATELETS: 282 10*3/uL (ref 150.0–400.0)
RBC: 3.65 Mil/uL — AB (ref 3.87–5.11)
RDW: 14.4 % (ref 11.5–15.5)
WBC: 6.4 10*3/uL (ref 4.0–10.5)

## 2018-10-30 MED ORDER — METOPROLOL TARTRATE 25 MG PO TABS: 12.5000 mg | ORAL_TABLET | Freq: Two times a day (BID) | ORAL | 1 refills | Status: DC

## 2018-10-30 NOTE — Patient Instructions (Signed)
Very nice to meet you today.  We will compete a CBC today to make sure blood counts are stable.  Follow up  for physical with PAP in 6 months   I have refilled your meds.  I have referred you to nephrology.  Please help [place] help you:  We are honored you have chosen [facility] for your Primary Care home. Below you will find basic instructions that you may need to access in the future. Please help [place] help you by reading the instructions, which cover many of the frequent questions we experience.   Prescription refills and request:  -In order to allow more efficient response time, please call your pharmacy for all refills. They will forward the request electronically to [place]. This allows for the quickest possible response. Request left on a nurse line can take longer to refill, since these are checked as time allows between office patients and other phone calls.  - refill request can take up to 3-5 working days to complete.  - If request is sent electronically and request is appropiate, it is usually completed in 1-2 business days.  - all patients will need to be seen routinely for all chronic medical conditions requiring prescription medications (see follow-up below). If you are overdue for follow up on your condition, you will be asked to make an appointment and we will call in enough medication to cover you until your appointment (up to 30 days).  - all controlled substances will require a face to face visit to request/refill.  - if you desire your prescriptions to go through a new pharmacy, and have an active script at original pharmacy, you will need to call your pharmacy and have scripts transferred to new pharmacy. This is completed between the pharmacy locations and not by your provider.    Results: If any images or labs were ordered, it can take up to 1 week to get results depending on the test ordered and the lab/facility running and resulting the test. - Normal or stable results, which  do not need further discussion, may be released to your mychart immediately with attached note to you. A call may not be generated for normal results. Please make certain to sign up for mychart. If you have questions on how to activate your mychart you can call the front office.  - If your results need further discussion, our office will attempt to contact you via phone, and if unable to reach you after 2 attempts, we will release your abnormal result to your mychart with instructions.  - All results will be automatically released in mychart after 1 week.  - Your provider will provide you with explanation and instruction on all relevant material in your results. Please keep in mind, results and labs may appear confusing or abnormal to the untrained eye, but it does not mean they are actually abnormal for you personally. If you have any questions about your results that are not covered, or you desire more detailed explanation than what was provided, you should make an appointment with your provider to do so.   Our office handles many outgoing and incoming calls daily. If we have not contacted you within 1 week about your results, please check your mychart to see if there is a message first and if not, then contact our office.  In helping with this matter, you help decrease call volume, and therefore allow [place] to be able to respond to patients needs more efficiently.   Acute  office visits (sick visit):  An acute visit is intended for a new problem and are scheduled in shorter time slots to allow schedule openings for patients with new problems. This is the appropriate visit to discuss a new problem. Problems will not be addressed by phone call or Echart message. Appointment is needed if requesting treatment. In order to provide you with excellent quality medical care with proper time for you to explain your problem, have an exam and receive treatment with instructions, these appointments should be limited to  one new problem per visit. If you experience a new problem, in which you desire to be addressed, please make an acute office visit, we save openings on the schedule to accommodate you. Please do not save your new problem for any other type of visit, let us take care of it properly and quickly for you.   Follow up visits:  Depending on your condition(s) your provider will need to see you routinely in order to provide you with quality care and prescribe medication(s). Most chronic conditions (Example: hypertension, Diabetes, depression/anxiety... etc), require visits a couple times a year. Your provider will instruct you on proper follow up for your personal medical conditions and history. Please make certain to make follow up appointments for your condition as instructed. Failing to do so could result in lapse in your medication treatment/refills. If you request a refill, and are overdue to be seen on a condition, we will always provide you with a 30 day script (once) to allow you time to schedule.    Medicare wellness (well visit): - we have a wonderful Nurse Maudie Mercury), that will meet with you and provide you will yearly medicare wellness visits. These visits should occur yearly (can not be scheduled less than 1 calendar year apart) and cover preventive health, immunizations, advance directives and screenings you are entitled to yearly through your medicare benefits. Do not miss out on your entitled benefits, this is when medicare will pay for these benefits to be ordered for you.  These are strongly encouraged by your provider and is the appropriate type of visit to make certain you are up to date with all preventive health benefits. If you have not had your medicare wellness exam in the last 12 months, please make certain to schedule one by calling the office and schedule your medicare wellness with Maudie Mercury as soon as possible.   Yearly physical (well visit):  - Adults are recommended to be seen yearly for  physicals. Check with your insurance and date of your last physical, most insurances require one calendar year between physicals. Physicals include all preventive health topics, screenings, medical exam and labs that are appropriate for gender/age and history. You may have fasting labs needed at this visit. This is a well visit (not a sick visit), new problems should not be covered during this visit (see acute visit).  - Pediatric patients are seen more frequently when they are younger. Your provider will advise you on well child visit timing that is appropriate for your their age. - This is not a medicare wellness visit. Medicare wellness exams do not have an exam portion to the visit. Some medicare companies allow for a physical, some do not allow a yearly physical. If your medicare allows a yearly physical you can schedule the medicare wellness with our nurse Maudie Mercury and have your physical with your provider after, on the same day. Please check with insurance for your full benefits.   Late Policy/No Shows:  -  all new patients should arrive 15-30 minutes earlier than appointment to allow [place] time  to  obtain all personal demographics,  insurance information and for you to complete office paperwork. - All established patients should arrive 10-15 minutes earlier than appointment time to update all information and be checked in .  - In our best efforts to run on time, if you are late for your appointment you will be asked to either reschedule or if able, we will work you back into the schedule. There will be a wait time to work you back in the schedule,  depending on availability.  - If you are unable to make it to your appointment as scheduled, please call 24 hours ahead of time to allow [place] to fill the time slot with someone else who needs to be seen. If you do not cancel your appointment ahead of time, you may be charged a no show fee.

## 2018-10-30 NOTE — Progress Notes (Signed)
Patient ID: Sharon Henson, female  DOB: 1961-05-23, 57 y.o.   MRN: [id number] Patient Care Team    Relationship Specialty Notifications Start End  Ma Hillock, DO PCP - General Family Medicine  10/30/18   Justin Mend, MD Attending Physician Internal Medicine  11/02/18   Lucas Mallow, MD Consulting Physician Urology  11/02/18     Chief Complaint  Patient presents with  . Establish Care    Subjective:  Sharon Henson is a 57 y.o.  female present for new patient establishment. All past medical history, surgical history, allergies, family history, immunizations, medications and social history were updated in the electronic medical record today. All recent labs, ED visits and hospitalizations within the last year were reviewed.  Anemia, unspecified type/Renal cell carcinoma of left kidney (HCC)/Solitary kidney, acquired Pt recently diagnosed with rather large left renal mass, found to be RCC. Underwent nephrectomy with significant hemorrhage/retroperitonel bleed 09/2018. Dr. Evon Slack lis her urologist. She is in need of referral to nephrologist- she saw Dr. Johnney Ou during her hospitalization. She is doing ok since her surgery. She has a supportive family and husband. She has a positive depression screen s/p surgery, but feels it is transitional and expected for the process.   Essential hypertension Pt reports compliance with Metoprolol 12.5 mg BID. Blood pressures ranges at home not routinely checked. Patient denies chest pain, shortness of breath or lower extremity edema.  BMP: 09/25/2018 GFR 56, Cr 1.2--> solitary right kidney 2/2 left RCC/nephrectomy CBC: 09/25/2018 anemia 10.3/31.6 (s/p nephrectomy)  Depression screen [facility] 2/9 10/30/2018  Decreased Interest 3  Down, Depressed, Hopeless 3  PHQ - 2 Score 6  Altered sleeping 3  Tired, decreased energy 3  Change in appetite 3  Feeling bad or failure about yourself  0  Trouble concentrating 0  Moving slowly or  fidgety/restless 0  Suicidal thoughts 0  PHQ-9 Score 15  Difficult doing work/chores Somewhat difficult   No flowsheet data found.  Current Exercise Habits: The patient does not participate in regular exercise at present Exercise limited by: Other - see comments(recent surgery) No flowsheet data found.   Immunization History  Administered Date(s) Administered  . Influenza Inj Mdck Quad Pf 10/27/2017  . Influenza,inj,Quad PF,6+ Mos 09/28/2018  . Pneumococcal Polysaccharide-23 04/03/1999  . Tdap 03/25/2008    No exam data present  Past Medical History:  Diagnosis Date  . Anemia 09/05/2018  . Chronic kidney disease   . Complication of anesthesia    "I don't get knocked out w/sodium pentothal" (09/09/2018)  . Depression   . Frequent UTI   . GERD (gastroesophageal reflux disease)   . Hiatal hernia   . History of blood transfusion 08/2018; 09/2018   /notes10/01/2018  . Hypertension   . Renal cell carcinoma ([place]) 09/2018   kidney  . Retroperitoneal hematoma    /notes10/01/2018   No Known Allergies Past Surgical History:  Procedure Laterality Date  . AUGMENTATION MAMMAPLASTY Bilateral 2006  . [place]; 1993  . DILATION AND CURETTAGE OF UTERUS  1979   "to remove blood clot in my uterus"  . IR ANGIO/SPINAL LEFT  09/14/2018  . IR ANGIO/SPINAL LEFT  09/14/2018  . IR ANGIOGRAM VISCERAL SELECTIVE  09/14/2018  . IR ANGIOGRAM VISCERAL SELECTIVE  09/14/2018  . IR FLUORO GUIDE CV LINE RIGHT  09/10/2018  . IR RENAL SELECTIVE  UNI INC S&I MOD SED  09/14/2018  . IR US GUIDE VASC ACCESS RIGHT  09/10/2018  .  IR US GUIDE VASC ACCESS RIGHT  09/14/2018  . NEPHRECTOMY Left 09/22/2018   Procedure: NEPHRECTOMY;  Surgeon: Lucas Mallow, MD;  Location: [facility];  Service: Urology;  Laterality: Left;  3 HRS  . UMBILICAL HERNIA REPAIR  04/08/2007   History reviewed. No pertinent family history. Social History   Socioeconomic History  . Marital status: Married    Spouse name: Not  on file  . Number of children: Not on file  . Years of education: Not on file  . Highest education level: Not on file  Occupational History  . Not on file  Social Needs  . Financial resource strain: Not hard at all  . Food insecurity:    Worry: Never true    Inability: Never true  . Transportation needs:    Medical: No    Non-medical: No  Tobacco Use  . Smoking status: Former Smoker    Packs/day: 0.10    Years: 2.00    Pack years: 0.20    Types: Cigarettes  . Smokeless tobacco: Never Used  . Tobacco comment: 09/09/2018 "quit smoking in the late 1980s"  Substance and Sexual Activity  . Alcohol use: Not Currently    Frequency: Never    Comment: 09/09/2018 "might have 1 drink/month"  . Drug use: Never  . Sexual activity: Yes    Partners: Male  Lifestyle  . Physical activity:    Days per week: 7 days    Minutes per session: 60 min  . Stress: Not at all  Relationships  . Social connections:    Talks on phone: More than three times a week    Gets together: More than three times a week    Attends religious service: Never    Active member of club or organization: No    Attends meetings of clubs or organizations: Never    Relationship status: Married  . Intimate partner violence:    Fear of current or ex partner: Patient refused    Emotionally abused: Patient refused    Physically abused: Patient refused    Forced sexual activity: Patient refused  Other Topics Concern  . Not on file  Social History Narrative   Marital status/children/pets: married, 3 children.    Education/employment: Some college, [employer]   Safety:      -Wears a bicycle helmet riding a bike: Yes     -smoke alarm in the home:Yes     - wears seatbelt: Yes     - Feels safe in their relationships: Yes   Allergies as of 10/30/2018   No Known Allergies     Medication List        Accurate as of 10/30/18 11:59 PM. Always use your most recent med list.          metoprolol tartrate 25 MG  tablet Commonly known as:  LOPRESSOR Take 0.5 tablets (12.5 mg total) by mouth 2 (two) times daily.      All past medical history, surgical history, allergies, family history, immunizations andmedications were updated in the EMR today and reviewed under the history and medication portions of their EMR.    Recent Results (from the past 2160 hour(s))  Basic metabolic panel     Status: Abnormal   Collection Time: 09/05/18  8:34 AM  Result Value Ref Range   Sodium 126 (L) 135 - 145 mmol/L   Potassium 4.0 3.5 - 5.1 mmol/L   Chloride 88 (L) 98 - 111 mmol/L   CO2 19 (L) 22 -  32 mmol/L   Glucose, Bld 153 (H) 70 - 99 mg/dL   BUN 50 (H) 6 - 20 mg/dL   Creatinine, Ser 3.14 (H) 0.44 - 1.00 mg/dL   Calcium 9.0 8.9 - 10.3 mg/dL   GFR calc non Af Amer 15 (L) >60 mL/min   GFR calc Af Amer 18 (L) >60 mL/min    Comment: (NOTE) The eGFR has been calculated using the CKD EPI equation. This calculation has not been validated in all clinical situations. eGFR's persistently <60 mL/min signify possible Chronic Kidney Disease.    Anion gap 19 (H) 5 - 15    Comment: Performed at [facility], [place] [address], [place], [place] [address]  CBC     Status: Abnormal   Collection Time: 09/05/18  8:34 AM  Result Value Ref Range   WBC 30.1 (H) 4.0 - 10.5 K/uL    Comment: RESULT REPEATED AND VERIFIED   RBC 3.12 (L) 3.87 - 5.11 MIL/uL   Hemoglobin 8.7 (L) 12.0 - 15.0 g/dL   HCT 27.5 (L) 36.0 - 46.0 %   MCV 88.1 78.0 - 100.0 fL   MCH 27.9 26.0 - 34.0 pg   MCHC 31.6 30.0 - 36.0 g/dL   RDW 13.7 11.5 - 15.5 %   Platelets 217 150 - 400 K/uL    Comment: Performed at [facility], [place] [address], [place], [place] [address]  I-Stat beta hCG blood, ED     Status: Abnormal   Collection Time: 09/05/18  8:40 AM  Result Value Ref Range   I-stat hCG, quantitative 23.9 (H) <5 mIU/mL   Comment 3            Comment:   GEST. AGE      CONC.  (mIU/mL)   <=1 WEEK        5 - 50     2 WEEKS       50 - 500     3  WEEKS       100 - 10,000     4 WEEKS     1,000 - 30,000        FEMALE AND NON-PREGNANT FEMALE:     LESS THAN 5 mIU/mL   Blood Culture (routine x 2)     Status: None   Collection Time: 09/05/18  9:44 AM  Result Value Ref Range   Specimen Description BLOOD WEAKLY REACTIVE LEFT    Special Requests      BOTTLES DRAWN AEROBIC ONLY Blood Culture results may not be optimal due to an inadequate volume of blood received in culture bottles   Culture      NO GROWTH 5 DAYS Performed at [facility], [place] [address] [place], [place] [address]    Report Status 09/10/2018 FINAL   Blood Culture (routine x 2)     Status: None   Collection Time: 09/05/18  9:49 AM  Result Value Ref Range   Specimen Description BLOOD LEFT HAND    Special Requests      BOTTLES DRAWN AEROBIC ONLY Blood Culture results may not be optimal due to an inadequate volume of blood received in culture bottles   Culture      NO GROWTH 5 DAYS Performed at [facility] [address], [place], [place] [address]    Report Status 09/10/2018 FINAL   I-stat troponin, ED     Status: None   Collection Time: 09/05/18 10:50 AM  Result Value Ref Range  Troponin i, poc 0.06 0.00 - 0.08 ng/mL   Comment 3            Comment: Due to the release kinetics of cTnI, a negative result within the first hours of the onset of symptoms does not rule out myocardial infarction with certainty. If myocardial infarction is still suspected, repeat the test at appropriate intervals.   I-Stat CG4 Lactic Acid, ED     Status: Abnormal   Collection Time: 09/05/18 12:00 PM  Result Value Ref Range   Lactic Acid, Venous 2.11 (HH) 0.5 - 1.9 mmol/L   Comment NOTIFIED PHYSICIAN   Urine C&S     Status: None   Collection Time: 09/05/18  1:22 PM  Result Value Ref Range   Specimen Description URINE, RANDOM    Special Requests NONE    Culture      NO GROWTH Performed at [facility], [place] [address] [place], [place] [address]    Report  Status 09/06/2018 FINAL   Urinalysis, Routine w reflex microscopic- may I&O cath if menses     Status: Abnormal   Collection Time: 09/05/18  1:23 PM  Result Value Ref Range   Color, Urine BROWN (A) YELLOW    Comment: BIOCHEMICALS MAY BE AFFECTED BY COLOR   APPearance TURBID (A) CLEAR   Specific Gravity, Urine 1.020 1.005 - 1.030   pH 6.5 5.0 - 8.0   Glucose, UA 100 (A) NEGATIVE mg/dL   Hgb urine dipstick LARGE (A) NEGATIVE   Bilirubin Urine MODERATE (A) NEGATIVE   Ketones, ur 15 (A) NEGATIVE mg/dL   Protein, ur >300 (A) NEGATIVE mg/dL   Nitrite POSITIVE (A) NEGATIVE   Leukocytes, UA MODERATE (A) NEGATIVE    Comment: Performed at [facility] [address] [place], [place] [address]  Pregnancy, urine     Status: None   Collection Time: 09/05/18  1:23 PM  Result Value Ref Range   Preg Test, Ur NEGATIVE NEGATIVE    Comment:        THE SENSITIVITY OF THIS METHODOLOGY IS >20 mIU/mL. Performed at [facility], [place] [address] [place], [place] [address]   Urinalysis, Microscopic (reflex)     Status: Abnormal   Collection Time: 09/05/18  1:23 PM  Result Value Ref Range   RBC / HPF >50 0 - 5 RBC/hpf   WBC, UA >50 0 - 5 WBC/hpf   Bacteria, UA MANY (A) NONE SEEN   Squamous Epithelial / LPF 6-10 0 - 5    Comment: Performed at [facility], [place] [address], [place], [place] [address]  Save smear     Status: None   Collection Time: 09/05/18  1:39 PM  Result Value Ref Range   Smear Review SMEAR STAINED AND AVAILABLE FOR REVIEW     Comment: Performed at [facility], [place] [address], [place], [place] [address]  Type and screen [place]     Status: None   Collection Time: 09/05/18  1:41 PM  Result Value Ref Range   ABO/RH(D) A POS    Antibody Screen NEG    Sample Expiration 09/08/2018    Unit Number [id number]    Blood Component Type RBC LR PHER2    Unit division 00    Status of Unit ISSUED,FINAL    Transfusion Status OK TO TRANSFUSE     Crossmatch Result      Compatible Performed at [facility], [place] [employer] [place]  [address] [place], [place] [address]    Unit Number [id number]    Blood Component Type RED CELLS,LR    Unit division 00    Status of Unit ISSUED,FINAL    Transfusion Status OK TO TRANSFUSE    Crossmatch Result Compatible   ABO/Rh     Status: None   Collection Time: 09/05/18  1:41 PM  Result Value Ref Range   ABO/RH(D)      A POS Performed at [facility], [place] [address], [place], [place] [address]   BPAM RBC     Status: None   Collection Time: 09/05/18  1:41 PM  Result Value Ref Range   ISSUE DATE / TIME [id number]    Blood Product Unit Number [id number]    PRODUCT CODE [id number]    Unit Type and Rh 6200    Blood Product Expiration Date 124580998338    ISSUE DATE / TIME [id number]    Blood Product Unit Number [id number]    PRODUCT CODE [id number]    Unit Type and Rh 6200    Blood Product Expiration Date [id number]   HIV antibody (Routine Testing)     Status: None   Collection Time: 09/05/18  4:19 PM  Result Value Ref Range   HIV Screen 4th Generation wRfx Non Reactive Non Reactive    Comment: (NOTE) Performed At: [facility] [place], [place] [id number] Rush Farmer MD YC:[id number]   Comprehensive metabolic panel     Status: Abnormal   Collection Time: 09/05/18  4:19 PM  Result Value Ref Range   Sodium 126 (L) 135 - 145 mmol/L   Potassium 4.0 3.5 - 5.1 mmol/L   Chloride 93 (L) 98 - 111 mmol/L   CO2 19 (L) 22 - 32 mmol/L   Glucose, Bld 114 (H) 70 - 99 mg/dL   BUN 53 (H) 6 - 20 mg/dL   Creatinine, Ser 3.21 (H) 0.44 - 1.00 mg/dL   Calcium 7.7 (L) 8.9 - 10.3 mg/dL   Total Protein 5.8 (L) 6.5 - 8.1 g/dL   Albumin 2.6 (L) 3.5 - 5.0 g/dL   AST 160 (H) 15 - 41 U/L   ALT 10 0 - 44 U/L   Alkaline Phosphatase 72 38 - 126 U/L   Total Bilirubin 1.0 0.3 - 1.2 mg/dL   GFR calc non Af Amer 15 (L) >60 mL/min   GFR calc Af Amer 17 (L) >60 mL/min    Comment:  (NOTE) The eGFR has been calculated using the CKD EPI equation. This calculation has not been validated in all clinical situations. eGFR's persistently <60 mL/min signify possible Chronic Kidney Disease.    Anion gap 14 5 - 15    Comment: Performed at [facility] [address] [place], [place] [address]  CBC     Status: Abnormal   Collection Time: 09/05/18  4:19 PM  Result Value Ref Range   WBC 22.0 (H) 4.0 - 10.5 K/uL   RBC 2.47 (L) 3.87 - 5.11 MIL/uL   Hemoglobin 6.9 (LL) 12.0 - 15.0 g/dL    Comment: REPEATED TO VERIFY A MITCHELL RN @ 1701 ON 09/05/18 BY HTEMOCHE    HCT 21.2 (L) 36.0 - 46.0 %   MCV 85.8 78.0 - 100.0 fL   MCH 27.9 26.0 - 34.0 pg   MCHC 32.5 30.0 - 36.0 g/dL   RDW 13.6 11.5 - 15.5 %   Platelets 185 150 - 400 K/uL    Comment: Performed at [facility], [address], [place], [place] [address]  Pathologist smear review     Status: None   Collection Time: 09/05/18  4:19 PM  Result Value Ref Range   Path Review Leukocytosis with mild left shift      Comment: Anemia Reviewed by Lennox Solders. Lyndon Code, M.D. 09/07/18 Performed at [facility], [place] [address], [place], [place] [address]   I-Stat CG4 Lactic Acid, ED     Status: None   Collection Time: 09/05/18  4:27 PM  Result Value Ref Range   Lactic Acid, Venous 1.84 0.5 - 1.9 mmol/L  Prepare RBC     Status: None   Collection Time: 09/05/18  5:25 PM  Result Value Ref Range   Order Confirmation      ORDER PROCESSED BY BLOOD BANK Performed at [facility], [place] [address], [place], [place] [address]   CBC     Status: Abnormal   Collection Time: 09/06/18  4:09 AM  Result Value Ref Range   WBC 16.2 (H) 4.0 - 10.5 K/uL   RBC 3.30 (L) 3.87 - 5.11 MIL/uL   Hemoglobin 9.2 (L) 12.0 - 15.0 g/dL    Comment: POST TRANSFUSION SPECIMEN   HCT 27.6 (L) 36.0 - 46.0 %   MCV 83.6 78.0 - 100.0 fL   MCH 27.9 26.0 - 34.0 pg   MCHC 33.3 30.0 - 36.0 g/dL   RDW 13.6 11.5 - 15.5 %   Platelets 181 150 - 400  K/uL    Comment: Performed at [facility], [place] [address] [place], [place] [address]  Basic metabolic panel     Status: Abnormal   Collection Time: 09/06/18  4:09 AM  Result Value Ref Range   Sodium 128 (L) 135 - 145 mmol/L   Potassium 4.5 3.5 - 5.1 mmol/L   Chloride 96 (L) 98 - 111 mmol/L   CO2 18 (L) 22 - 32 mmol/L   Glucose, Bld 115 (H) 70 - 99 mg/dL   BUN 58 (H) 6 - 20 mg/dL   Creatinine, Ser 3.47 (H) 0.44 - 1.00 mg/dL   Calcium 7.8 (L) 8.9 - 10.3 mg/dL   GFR calc non Af Amer 13 (L) >60 mL/min   GFR calc Af Amer 16 (L) >60 mL/min    Comment: (NOTE) The eGFR has been calculated using the CKD EPI equation. This calculation has not been validated in all clinical situations. eGFR's persistently <60 mL/min signify possible Chronic Kidney Disease.    Anion gap 14 5 - 15    Comment: Performed at [facility] [address] [place], [place] [address]  CBC     Status: Abnormal   Collection Time: 09/06/18  9:10 AM  Result Value Ref Range   WBC 17.1 (H) 4.0 - 10.5 K/uL   RBC 3.52 (L) 3.87 - 5.11 MIL/uL   Hemoglobin 9.7 (L) 12.0 - 15.0 g/dL   HCT 29.5 (L) 36.0 - 46.0 %   MCV 83.8 78.0 - 100.0 fL   MCH 27.6 26.0 - 34.0 pg   MCHC 32.9 30.0 - 36.0 g/dL   RDW 14.4 11.5 - 15.5 %   Platelets 202 150 - 400 K/uL    Comment: Performed at [facility], [place] [address], [place], [place] [address]  Urinalysis, Routine w reflex microscopic     Status: Abnormal   Collection Time: 09/06/18 11:34 AM  Result Value Ref Range   Color, Urine RED (A) YELLOW  Comment: BIOCHEMICALS MAY BE AFFECTED BY COLOR   APPearance CLOUDY (A) CLEAR   Specific Gravity, Urine 1.015 1.005 - 1.030   pH 6.5 5.0 - 8.0   Glucose, UA 100 (A) NEGATIVE mg/dL   Hgb urine dipstick LARGE (A) NEGATIVE   Bilirubin Urine SMEAR ONLY (A) NEGATIVE   Ketones, ur TRACE (A) NEGATIVE mg/dL   Protein, ur >300 (A) NEGATIVE mg/dL   Nitrite POSITIVE (A) NEGATIVE   Leukocytes, UA SMEAR ONLY (A) NEGATIVE     Comment: Performed at [facility] [address] [place], [place] [address]  Urinalysis, Microscopic (reflex)     Status: Abnormal   Collection Time: 09/06/18 11:34 AM  Result Value Ref Range   RBC / HPF >50 0 - 5 RBC/hpf   WBC, UA 6-10 0 - 5 WBC/hpf   Bacteria, UA RARE (A) NONE SEEN   Squamous Epithelial / LPF 6-10 0 - 5    Comment: Performed at [facility], [place] [address], [place], [place] [address]  Na and K (sodium & potassium), rand urine     Status: None   Collection Time: 09/06/18 11:35 AM  Result Value Ref Range   Sodium, Ur 43 mmol/L   Potassium Urine 32 mmol/L    Comment: Performed at [facility] [address], [place], [place] [address]  Creatinine, urine, random     Status: None   Collection Time: 09/06/18 11:35 AM  Result Value Ref Range   Creatinine, Urine 67.54 mg/dL    Comment: Performed at [facility] [address] [place], [place] [address]  Protein / creatinine ratio, urine     Status: None   Collection Time: 09/06/18 11:35 AM  Result Value Ref Range   Creatinine, Urine 67.82 mg/dL   Total Protein, Urine 264 mg/dL    Comment: NO NORMAL RANGE ESTABLISHED FOR THIS TEST RESULTS CONFIRMED BY MANUAL DILUTION    Protein Creatinine Ratio        0.00 - 0.15 mg/mg[Cre]    Comment: RESULT BELOW REPORTABLE RANGE, UNABLE TO CALCULATE. Performed at [facility], [place] [address] [place], [place] [address]   CBC     Status: Abnormal   Collection Time: 09/06/18  3:19 PM  Result Value Ref Range   WBC 14.7 (H) 4.0 - 10.5 K/uL   RBC 3.48 (L) 3.87 - 5.11 MIL/uL   Hemoglobin 9.6 (L) 12.0 - 15.0 g/dL   HCT 29.2 (L) 36.0 - 46.0 %   MCV 83.9 78.0 - 100.0 fL   MCH 27.6 26.0 - 34.0 pg   MCHC 32.9 30.0 - 36.0 g/dL   RDW 14.4 11.5 - 15.5 %   Platelets 184 150 - 400 K/uL    Comment: Performed at [facility], [place] [address] [place], [place] [address]  APTT     Status: None   Collection Time: 09/06/18  3:19 PM  Result Value Ref  Range   aPTT 31 24 - 36 seconds    Comment: Performed at [facility] [address], [place], [place] [address]  Protime-INR     Status: Abnormal   Collection Time: 09/06/18  3:19 PM  Result Value Ref Range   Prothrombin Time 15.6 (H) 11.4 - 15.2 seconds   INR 1.25     Comment: Performed at [facility] [address], [place], [place] [address]  Basic metabolic panel     Status: Abnormal   Collection  Time: 09/06/18  4:48 PM  Result Value Ref Range   Sodium 126 (L) 135 - 145 mmol/L   Potassium 4.8 3.5 - 5.1 mmol/L   Chloride 95 (L) 98 - 111 mmol/L   CO2 18 (L) 22 - 32 mmol/L   Glucose, Bld 93 70 - 99 mg/dL   BUN 61 (H) 6 - 20 mg/dL   Creatinine, Ser 3.44 (H) 0.44 - 1.00 mg/dL   Calcium 7.9 (L) 8.9 - 10.3 mg/dL   GFR calc non Af Amer 14 (L) >60 mL/min   GFR calc Af Amer 16 (L) >60 mL/min    Comment: (NOTE) The eGFR has been calculated using the CKD EPI equation. This calculation has not been validated in all clinical situations. eGFR's persistently <60 mL/min signify possible Chronic Kidney Disease.    Anion gap 13 5 - 15    Comment: Performed at [facility] [address], [place], [place] [address]  CBC     Status: Abnormal   Collection Time: 09/06/18  8:37 PM  Result Value Ref Range   WBC 15.1 (H) 4.0 - 10.5 K/uL   RBC 3.42 (L) 3.87 - 5.11 MIL/uL   Hemoglobin 9.6 (L) 12.0 - 15.0 g/dL   HCT 28.8 (L) 36.0 - 46.0 %   MCV 84.2 78.0 - 100.0 fL   MCH 28.1 26.0 - 34.0 pg   MCHC 33.3 30.0 - 36.0 g/dL   RDW 14.6 11.5 - 15.5 %   Platelets 192 150 - 400 K/uL    Comment: Performed at [facility] [address] [place], [place] [address]  CBC     Status: Abnormal   Collection Time: 09/07/18  4:30 AM  Result Value Ref Range   WBC 13.7 (H) 4.0 - 10.5 K/uL   RBC 3.19 (L) 3.87 - 5.11 MIL/uL   Hemoglobin 8.7 (L) 12.0 - 15.0 g/dL   HCT 26.8 (L) 36.0 - 46.0 %   MCV 84.0 78.0 - 100.0 fL   MCH 27.3 26.0 - 34.0 pg   MCHC 32.5 30.0 - 36.0 g/dL   RDW 14.5  11.5 - 15.5 %   Platelets 188 150 - 400 K/uL    Comment: Performed at [facility], [place] [address], [place], [place] [address]  Basic metabolic panel     Status: Abnormal   Collection Time: 09/07/18  4:30 AM  Result Value Ref Range   Sodium 127 (L) 135 - 145 mmol/L   Potassium 4.5 3.5 - 5.1 mmol/L   Chloride 98 98 - 111 mmol/L   CO2 17 (L) 22 - 32 mmol/L   Glucose, Bld 89 70 - 99 mg/dL   BUN 65 (H) 6 - 20 mg/dL   Creatinine, Ser 3.79 (H) 0.44 - 1.00 mg/dL   Calcium 8.1 (L) 8.9 - 10.3 mg/dL   GFR calc non Af Amer 12 (L) >60 mL/min   GFR calc Af Amer 14 (L) >60 mL/min    Comment: (NOTE) The eGFR has been calculated using the CKD EPI equation. This calculation has not been validated in all clinical situations. eGFR's persistently <60 mL/min signify possible Chronic Kidney Disease.    Anion gap 12 5 - 15    Comment: Performed at [facility] [address], [place], [place] [address]  CBC     Status: Abnormal   Collection Time: 09/07/18  2:25 PM  Result Value Ref Range   WBC 14.4 (H) 4.0 - 10.5 K/uL   RBC  3.27 (L) 3.87 - 5.11 MIL/uL   Hemoglobin 9.0 (L) 12.0 - 15.0 g/dL   HCT 27.6 (L) 36.0 - 46.0 %   MCV 84.4 78.0 - 100.0 fL   MCH 27.5 26.0 - 34.0 pg   MCHC 32.6 30.0 - 36.0 g/dL   RDW 14.4 11.5 - 15.5 %   Platelets 226 150 - 400 K/uL    Comment: Performed at [facility] [address], [place], [place] [address]  Osmolality     Status: None   Collection Time: 09/07/18  2:25 PM  Result Value Ref Range   Osmolality 290 275 - 295 mOsm/kg    Comment: Performed at [facility], [place] [address] [place], [place] [address]  Osmolality, urine     Status: Abnormal   Collection Time: 09/07/18  7:43 PM  Result Value Ref Range   Osmolality, Ur 246 (L) 300 - 900 mOsm/kg    Comment: Performed at [facility] [address], [place], [place] [address]  Renal function panel     Status: Abnormal   Collection Time: 09/08/18  5:29 AM  Result Value Ref Range     Sodium 130 (L) 135 - 145 mmol/L   Potassium 4.5 3.5 - 5.1 mmol/L   Chloride 99 98 - 111 mmol/L   CO2 15 (L) 22 - 32 mmol/L   Glucose, Bld 99 70 - 99 mg/dL   BUN 69 (H) 6 - 20 mg/dL   Creatinine, Ser 3.99 (H) 0.44 - 1.00 mg/dL   Calcium 8.6 (L) 8.9 - 10.3 mg/dL   Phosphorus 5.9 (H) 2.5 - 4.6 mg/dL   Albumin 2.3 (L) 3.5 - 5.0 g/dL   GFR calc non Af Amer 11 (L) >60 mL/min   GFR calc Af Amer 13 (L) >60 mL/min    Comment: (NOTE) The eGFR has been calculated using the CKD EPI equation. This calculation has not been validated in all clinical situations. eGFR's persistently <60 mL/min signify possible Chronic Kidney Disease.    Anion gap 16 (H) 5 - 15    Comment: Performed at [facility], [place] [address] [place], [place] [address]  CBC     Status: Abnormal   Collection Time: 09/08/18  5:29 AM  Result Value Ref Range   WBC 19.4 (H) 4.0 - 10.5 K/uL   RBC 2.98 (L) 3.87 - 5.11 MIL/uL   Hemoglobin 8.3 (L) 12.0 - 15.0 g/dL   HCT 25.8 (L) 36.0 - 46.0 %   MCV 86.6 78.0 - 100.0 fL   MCH 27.9 26.0 - 34.0 pg   MCHC 32.2 30.0 - 36.0 g/dL   RDW 14.7 11.5 - 15.5 %   Platelets 216 150 - 400 K/uL    Comment: Performed at [facility] [address] [place], [place] [address]  Protein, urine, 24 hour     Status: Abnormal   Collection Time: 09/08/18 10:30 PM  Result Value Ref Range   Urine Total Volume-UPROT 700 mL   Collection Interval-UPROT 24 hours   Protein, Urine 130 mg/dL   Protein, 24H Urine 910 (H) 50 - 100 mg/day    Comment: Performed at [facility], [place] [address], [place],  [address]  Creatinine, urine, 24 hour     Status: Abnormal   Collection Time: 09/08/18 10:30 PM  Result Value Ref Range   Urine Total Volume-UCRE24 700 mL   Collection Interval-UCRE24 24 hours   Creatinine, Urine 66.03 mg/dL  Creatinine, 24H Ur 462 (L) 600 - 1,800 mg/day    Comment: Performed at [facility], [place] [address] [place], [place] [address]  CBC     Status:  Abnormal   Collection Time: 09/09/18  4:09 AM  Result Value Ref Range   WBC 18.8 (H) 4.0 - 10.5 K/uL   RBC 2.57 (L) 3.87 - 5.11 MIL/uL   Hemoglobin 7.1 (L) 12.0 - 15.0 g/dL   HCT 21.8 (L) 36.0 - 46.0 %   MCV 84.8 78.0 - 100.0 fL   MCH 27.6 26.0 - 34.0 pg   MCHC 32.6 30.0 - 36.0 g/dL   RDW 14.3 11.5 - 15.5 %   Platelets 226 150 - 400 K/uL    Comment: Performed at [facility], [place] [address] [place], [place] [address]  Basic metabolic panel     Status: Abnormal   Collection Time: 09/09/18  4:09 AM  Result Value Ref Range   Sodium 126 (L) 135 - 145 mmol/L   Potassium 3.9 3.5 - 5.1 mmol/L   Chloride 103 98 - 111 mmol/L   CO2 16 (L) 22 - 32 mmol/L   Glucose, Bld 136 (H) 70 - 99 mg/dL   BUN 70 (H) 6 - 20 mg/dL   Creatinine, Ser 4.07 (H) 0.44 - 1.00 mg/dL   Calcium 7.4 (L) 8.9 - 10.3 mg/dL   GFR calc non Af Amer 11 (L) >60 mL/min   GFR calc Af Amer 13 (L) >60 mL/min    Comment: (NOTE) The eGFR has been calculated using the CKD EPI equation. This calculation has not been validated in all clinical situations. eGFR's persistently <60 mL/min signify possible Chronic Kidney Disease.    Anion gap 7 5 - 15    Comment: Performed at [facility] [address] [place], [place] [address]  Prepare RBC     Status: None   Collection Time: 09/09/18 11:45 AM  Result Value Ref Range   Order Confirmation      ORDER PROCESSED BY BLOOD BANK Performed at [facility], [place] [address] [place], [place] [address]   Type and screen [place]     Status: None   Collection Time: 09/09/18 11:45 AM  Result Value Ref Range   ABO/RH(D) A POS    Antibody Screen NEG    Sample Expiration 09/12/2018    Unit Number [id number]    Blood Component Type RED CELLS,LR    Unit division 00    Status of Unit ISSUED,FINAL    Transfusion Status OK TO TRANSFUSE    Crossmatch Result      Compatible Performed at [facility], [place] [address] [place], [place]  [address]   BPAM RBC     Status: None   Collection Time: 09/09/18 11:45 AM  Result Value Ref Range   ISSUE DATE / TIME 151761607371    Blood Product Unit Number [id number]    PRODUCT CODE [id number]    Unit Type and Rh 6200    Blood Product Expiration Date 350093818299   Hemoglobin and hematocrit, blood     Status: Abnormal   Collection Time: 09/09/18  8:58 PM  Result Value Ref Range   Hemoglobin 9.4 (L) 12.0 - 15.0 g/dL    Comment: REPEATED TO VERIFY POST TRANSFUSION SPECIMEN    HCT 28.9 (L) 36.0 - 46.0 %    Comment: Performed at [facility], [place] [address] [place], [place] [address]  CBC     Status: Abnormal   Collection Time: 09/10/18  6:52 AM  Result Value Ref Range   WBC 24.3 (H) 4.0 - 10.5 K/uL   RBC 3.45 (L) 3.87 - 5.11 MIL/uL   Hemoglobin 9.8 (L) 12.0 - 15.0 g/dL   HCT 30.4 (L) 36.0 - 46.0 %   MCV 88.1 78.0 - 100.0 fL   MCH 28.4 26.0 - 34.0 pg   MCHC 32.2 30.0 - 36.0 g/dL   RDW 14.5 11.5 - 15.5 %   Platelets 293 150 - 400 K/uL    Comment: Performed at [facility], [place] [address], [place], [place] [address]  Basic metabolic panel     Status: Abnormal   Collection Time: 09/10/18  6:52 AM  Result Value Ref Range   Sodium 130 (L) 135 - 145 mmol/L   Potassium 4.1 3.5 - 5.1 mmol/L   Chloride 102 98 - 111 mmol/L   CO2 15 (L) 22 - 32 mmol/L   Glucose, Bld 106 (H) 70 - 99 mg/dL   BUN 78 (H) 6 - 20 mg/dL   Creatinine, Ser 4.59 (H) 0.44 - 1.00 mg/dL   Calcium 8.3 (L) 8.9 - 10.3 mg/dL   GFR calc non Af Amer 10 (L) >60 mL/min   GFR calc Af Amer 11 (L) >60 mL/min    Comment: (NOTE) The eGFR has been calculated using the CKD EPI equation. This calculation has not been validated in all clinical situations. eGFR's persistently <60 mL/min signify possible Chronic Kidney Disease.    Anion gap 13 5 - 15    Comment: Performed at [facility] [address] [place], [place] [address]  Basic metabolic panel     Status: Abnormal   Collection Time: 09/11/18   6:58 AM  Result Value Ref Range   Sodium 135 135 - 145 mmol/L   Potassium 3.5 3.5 - 5.1 mmol/L   Chloride 97 (L) 98 - 111 mmol/L   CO2 19 (L) 22 - 32 mmol/L   Glucose, Bld 144 (H) 70 - 99 mg/dL   BUN 76 (H) 6 - 20 mg/dL   Creatinine, Ser 3.80 (H) 0.44 - 1.00 mg/dL   Calcium 8.9 8.9 - 10.3 mg/dL   GFR calc non Af Amer 12 (L) >60 mL/min   GFR calc Af Amer 14 (L) >60 mL/min    Comment: (NOTE) The eGFR has been calculated using the CKD EPI equation. This calculation has not been validated in all clinical situations. eGFR's persistently <60 mL/min signify possible Chronic Kidney Disease.    Anion gap 19 (H) 5 - 15    Comment: Performed at [facility], [place] [address] [place],  [address]  CBC with Differential/Platelet     Status: Abnormal   Collection Time: 09/11/18  6:58 AM  Result Value Ref Range   WBC 24.1 (H) 4.0 - 10.5 K/uL    Comment: REPEATED TO VERIFY   RBC 2.97 (L) 3.87 - 5.11 MIL/uL   Hemoglobin 8.3 (L) 12.0 - 15.0 g/dL   HCT 25.5 (L) 36.0 - 46.0 %   MCV 85.9 78.0 - 100.0 fL   MCH 27.9 26.0 - 34.0 pg   MCHC 32.5 30.0 - 36.0 g/dL   RDW 14.6 11.5 - 15.5 %   Platelets 357 150 - 400 K/uL   Neutrophils Relative % 81 %   Lymphocytes Relative 8 %   Monocytes Relative 8 %   Eosinophils Relative 3 %   Basophils  Relative 0 %   Neutro Abs 19.6 (H) 1.7 - 7.7 K/uL   Lymphs Abs 1.9 0.7 - 4.0 K/uL   Monocytes Absolute 1.9 (H) 0.1 - 1.0 K/uL   Eosinophils Absolute 0.7 0.0 - 0.7 K/uL   Basophils Absolute 0.0 0.0 - 0.1 K/uL   RBC Morphology POLYCHROMASIA PRESENT    WBC Morphology      MODERATE LEFT SHIFT (>5% METAS AND MYELOS,OCC PRO NOTED)    Comment: Performed at [facility] [address], [place], [place] [address]  Basic metabolic panel     Status: Abnormal   Collection Time: 09/12/18  4:33 AM  Result Value Ref Range   Sodium 133 (L) 135 - 145 mmol/L   Potassium 3.1 (L) 3.5 - 5.1 mmol/L   Chloride 100 98 - 111 mmol/L   CO2 20 (L) 22 - 32 mmol/L    Glucose, Bld 163 (H) 70 - 99 mg/dL   BUN 73 (H) 6 - 20 mg/dL   Creatinine, Ser 3.08 (H) 0.44 - 1.00 mg/dL   Calcium 8.1 (L) 8.9 - 10.3 mg/dL   GFR calc non Af Amer 16 (L) >60 mL/min   GFR calc Af Amer 18 (L) >60 mL/min    Comment: (NOTE) The eGFR has been calculated using the CKD EPI equation. This calculation has not been validated in all clinical situations. eGFR's persistently <60 mL/min signify possible Chronic Kidney Disease.    Anion gap 13 5 - 15    Comment: Performed at [facility] [address] [place], [place] [address]  CBC     Status: Abnormal   Collection Time: 09/12/18  4:33 AM  Result Value Ref Range   WBC 20.8 (H) 4.0 - 10.5 K/uL   RBC 2.60 (L) 3.87 - 5.11 MIL/uL   Hemoglobin 7.3 (L) 12.0 - 15.0 g/dL   HCT 22.3 (L) 36.0 - 46.0 %   MCV 85.8 78.0 - 100.0 fL   MCH 28.1 26.0 - 34.0 pg   MCHC 32.7 30.0 - 36.0 g/dL   RDW 14.6 11.5 - 15.5 %   Platelets 312 150 - 400 K/uL    Comment: Performed at [facility] [address] [place], [place] [address]  Basic metabolic panel     Status: Abnormal   Collection Time: 09/12/18  2:07 PM  Result Value Ref Range   Sodium 133 (L) 135 - 145 mmol/L   Potassium 3.8 3.5 - 5.1 mmol/L    Comment: NO VISIBLE HEMOLYSIS   Chloride 101 98 - 111 mmol/L   CO2 20 (L) 22 - 32 mmol/L   Glucose, Bld 151 (H) 70 - 99 mg/dL   BUN 70 (H) 6 - 20 mg/dL   Creatinine, Ser 2.82 (H) 0.44 - 1.00 mg/dL   Calcium 8.0 (L) 8.9 - 10.3 mg/dL   GFR calc non Af Amer 17 (L) >60 mL/min   GFR calc Af Amer 20 (L) >60 mL/min    Comment: (NOTE) The eGFR has been calculated using the CKD EPI equation. This calculation has not been validated in all clinical situations. eGFR's persistently <60 mL/min signify possible Chronic Kidney Disease.    Anion gap 12 5 - 15    Comment: Performed at [facility] [address] [place], [place] [address]  Basic metabolic panel     Status: Abnormal   Collection Time: 09/13/18  3:29 AM  Result Value  Ref Range   Sodium 134 (L) 135 - 145 mmol/L  Potassium 3.6 3.5 - 5.1 mmol/L   Chloride 102 98 - 111 mmol/L   CO2 23 22 - 32 mmol/L   Glucose, Bld 131 (H) 70 - 99 mg/dL   BUN 59 (H) 6 - 20 mg/dL   Creatinine, Ser 2.40 (H) 0.44 - 1.00 mg/dL   Calcium 8.2 (L) 8.9 - 10.3 mg/dL   GFR calc non Af Amer 21 (L) >60 mL/min   GFR calc Af Amer 24 (L) >60 mL/min    Comment: (NOTE) The eGFR has been calculated using the CKD EPI equation. This calculation has not been validated in all clinical situations. eGFR's persistently <60 mL/min signify possible Chronic Kidney Disease.    Anion gap 9 5 - 15    Comment: Performed at [facility] [address], [place], [place] [address]  CBC     Status: Abnormal   Collection Time: 09/13/18  3:29 AM  Result Value Ref Range   WBC 20.3 (H) 4.0 - 10.5 K/uL   RBC 2.33 (L) 3.87 - 5.11 MIL/uL   Hemoglobin 6.6 (LL) 12.0 - 15.0 g/dL    Comment: REPEATED TO VERIFY SPECIMEN CHECKED FOR CLOTS CRITICAL RESULT CALLED TO, READ BACK BY AND VERIFIED WITH: Vicenta Aly RN [id number] SHORTT    HCT 20.5 (L) 36.0 - 46.0 %   MCV 88.0 78.0 - 100.0 fL   MCH 28.3 26.0 - 34.0 pg   MCHC 32.2 30.0 - 36.0 g/dL   RDW 14.6 11.5 - 15.5 %   Platelets 275 150 - 400 K/uL    Comment: Performed at [facility], [place] [address], [place], [place] [address]  Type and screen [place]     Status: None   Collection Time: 09/13/18  8:10 AM  Result Value Ref Range   ABO/RH(D) A POS    Antibody Screen NEG    Sample Expiration 09/16/2018    Unit Number [id number]    Blood Component Type RED CELLS,LR    Unit division 00    Status of Unit ISSUED,FINAL    Transfusion Status OK TO TRANSFUSE    Crossmatch Result Compatible    Unit Number [id number]    Blood Component Type RED CELLS,LR    Unit division 00    Status of Unit ISSUED,FINAL    Transfusion Status OK TO TRANSFUSE    Crossmatch Result Compatible    Unit Number [id number]    Blood  Component Type RED CELLS,LR    Unit division 00    Status of Unit ISSUED,FINAL    Transfusion Status OK TO TRANSFUSE    Crossmatch Result Compatible    Unit Number [id number]    Blood Component Type RBC LR PHER2    Unit division 00    Status of Unit ISSUED,FINAL    Transfusion Status OK TO TRANSFUSE    Crossmatch Result      Compatible Performed at [facility], [place] [address] [place], [place] [address]   BPAM RBC     Status: None   Collection Time: 09/13/18  8:10 AM  Result Value Ref Range   ISSUE DATE / TIME [id number]    Blood Product Unit Number [id number]    PRODUCT CODE E0336V00    Unit Type and Rh 6200    Blood Product Expiration Date 092330076226    ISSUE DATE / TIME [id number]    Blood Product Unit Number [id number]    PRODUCT CODE [id number]    Unit Type and Rh  6200    Blood Product Expiration Date 761950932671    ISSUE DATE / TIME 245809983382    Blood Product Unit Number [id number]    PRODUCT CODE X9024O97    Unit Type and Rh 3532    Blood Product Expiration Date 992426834196    ISSUE DATE / TIME 222979892119    Blood Product Unit Number [id number]    PRODUCT CODE [id number]    Unit Type and Rh 6378    Blood Product Expiration Date 588502774128   Prepare RBC     Status: None   Collection Time: 09/13/18  8:12 AM  Result Value Ref Range   Order Confirmation      ORDER PROCESSED BY BLOOD BANK Performed at [facility], [place] [address], [place], [place] [address]   Basic metabolic panel     Status: Abnormal   Collection Time: 09/13/18  4:04 PM  Result Value Ref Range   Sodium 133 (L) 135 - 145 mmol/L   Potassium 3.5 3.5 - 5.1 mmol/L   Chloride 102 98 - 111 mmol/L   CO2 22 22 - 32 mmol/L   Glucose, Bld 165 (H) 70 - 99 mg/dL   BUN 55 (H) 6 - 20 mg/dL   Creatinine, Ser 2.03 (H) 0.44 - 1.00 mg/dL   Calcium 7.9 (L) 8.9 - 10.3 mg/dL   GFR calc non Af Amer 26 (L) >60 mL/min   GFR calc Af Amer 30 (L) >60 mL/min    Comment:  (NOTE) The eGFR has been calculated using the CKD EPI equation. This calculation has not been validated in all clinical situations. eGFR's persistently <60 mL/min signify possible Chronic Kidney Disease.    Anion gap 9 5 - 15    Comment: Performed at [facility] [address] [place], [place] [address]  Hemoglobin and hematocrit, blood     Status: Abnormal   Collection Time: 09/13/18  5:50 PM  Result Value Ref Range   Hemoglobin 7.9 (L) 12.0 - 15.0 g/dL   HCT 24.4 (L) 36.0 - 46.0 %    Comment: Performed at [facility], [place] [address] [place] [address], [place], [place] [address]  Prepare RBC     Status: None   Collection Time: 09/13/18  7:32 PM  Result Value Ref Range   Order Confirmation      ORDER PROCESSED BY BLOOD BANK Performed at [facility], [place] [address], [place], [place] [address]   Basic metabolic panel     Status: Abnormal   Collection Time: 09/14/18  3:05 AM  Result Value Ref Range   Sodium 130 (L) 135 - 145 mmol/L   Potassium 4.0 3.5 - 5.1 mmol/L   Chloride 99 98 - 111 mmol/L   CO2 21 (L) 22 - 32 mmol/L   Glucose, Bld 162 (H) 70 - 99 mg/dL   BUN 54 (H) 6 - 20 mg/dL   Creatinine, Ser 1.85 (H) 0.44 - 1.00 mg/dL   Calcium 7.9 (L) 8.9 - 10.3 mg/dL   GFR calc non Af Amer 29 (L) >60 mL/min   GFR calc Af Amer 33 (L) >60 mL/min    Comment: (NOTE) The eGFR has been calculated using the CKD EPI equation. This calculation has not been validated in all clinical situations. eGFR's persistently <60 mL/min signify possible Chronic Kidney Disease.    Anion gap 10 5 - 15    Comment: Performed at [facility] [address], [place], [place] [address]  CBC  Status: Abnormal   Collection Time: 09/14/18  3:05 AM  Result Value Ref Range   WBC 41.0 (H) 4.0 - 10.5 K/uL    Comment: REPEATED TO VERIFY   RBC 2.54 (L) 3.87 - 5.11 MIL/uL   Hemoglobin 7.5 (L) 12.0 - 15.0 g/dL   HCT 22.2 (L) 36.0 - 46.0 %   MCV 87.4 78.0 - 100.0 fL   MCH 29.5 26.0 - 34.0 pg   MCHC  33.8 30.0 - 36.0 g/dL   RDW 13.7 11.5 - 15.5 %   Platelets 233 150 - 400 K/uL    Comment: Performed at [facility], [place] [address], [place], [place] [address]  CBC     Status: Abnormal   Collection Time: 09/14/18  9:45 AM  Result Value Ref Range   WBC 36.2 (H) 4.0 - 10.5 K/uL   RBC 2.24 (L) 3.87 - 5.11 MIL/uL   Hemoglobin 6.6 (LL) 12.0 - 15.0 g/dL    Comment: REPEATED TO VERIFY CRITICAL RESULT CALLED TO, READ BACK BY AND VERIFIED WITH: K.MOORE RN 09/14/18 1041 [email or web address]    HCT 19.4 (L) 36.0 - 46.0 %   MCV 86.6 78.0 - 100.0 fL   MCH 29.5 26.0 - 34.0 pg   MCHC 34.0 30.0 - 36.0 g/dL   RDW 13.9 11.5 - 15.5 %   Platelets 248 150 - 400 K/uL    Comment: Performed at [facility], [place] [address] [place], [place] [address]  Prepare RBC     Status: None   Collection Time: 09/14/18 11:58 AM  Result Value Ref Range   Order Confirmation      ORDER PROCESSED BY BLOOD BANK Performed at [facility], [place] [address], [place], [place] [address]   Protime-INR     Status: Abnormal   Collection Time: 09/14/18  3:13 PM  Result Value Ref Range   Prothrombin Time 16.7 (H) 11.4 - 15.2 seconds   INR 1.36     Comment: Performed at [facility] [address] [place], [place] [address]  MRSA PCR Screening     Status: None   Collection Time: 09/14/18  7:50 PM  Result Value Ref Range   MRSA by PCR NEGATIVE NEGATIVE    Comment:        The GeneXpert MRSA Assay (FDA approved for NASAL specimens only), is one component of a comprehensive MRSA colonization surveillance program. It is not intended to diagnose MRSA infection nor to guide or monitor treatment for MRSA infections. Performed at [facility], [place] [address], [place], [place] [address]   Hemoglobin and hematocrit, blood     Status: Abnormal   Collection Time: 09/14/18 11:15 PM  Result Value Ref Range   Hemoglobin 7.2 (L) 12.0 - 15.0 g/dL   HCT 21.1 (L) 36.0 - 46.0 %    Comment: Performed at [facility], [place] [address] [place], [place] [address]  Prepare RBC     Status: None   Collection Time: 09/14/18 11:58 PM  Result Value Ref Range   Order Confirmation      ORDER PROCESSED BY BLOOD BANK Performed at [facility], [place] [address], [place], [place] [address]   Basic metabolic panel     Status: Abnormal   Collection Time: 09/15/18  8:29 AM  Result Value Ref Range   Sodium 132 (L) 135 - 145 mmol/L   Potassium 4.0 3.5 - 5.1 mmol/L   Chloride 98 98 -  111 mmol/L   CO2 23 22 - 32 mmol/L   Glucose, Bld 99 70 - 99 mg/dL   BUN 42 (H) 6 - 20 mg/dL   Creatinine, Ser 1.72 (H) 0.44 - 1.00 mg/dL   Calcium 8.2 (L) 8.9 - 10.3 mg/dL   GFR calc non Af Amer 31 (L) >60 mL/min   GFR calc Af Amer 36 (L) >60 mL/min    Comment: (NOTE) The eGFR has been calculated using the CKD EPI equation. This calculation has not been validated in all clinical situations. eGFR's persistently <60 mL/min signify possible Chronic Kidney Disease.    Anion gap 11 5 - 15    Comment: Performed at [facility] [address], [place], [place] [address]  CBC     Status: Abnormal   Collection Time: 09/15/18  8:29 AM  Result Value Ref Range   WBC 47.2 (H) 4.0 - 10.5 K/uL   RBC 2.77 (L) 3.87 - 5.11 MIL/uL   Hemoglobin 8.2 (L) 12.0 - 15.0 g/dL   HCT 24.1 (L) 36.0 - 46.0 %   MCV 87.0 80.0 - 100.0 fL   MCH 29.6 26.0 - 34.0 pg   MCHC 34.0 30.0 - 36.0 g/dL   RDW 14.5 11.5 - 15.5 %   Platelets 217 150 - 400 K/uL    Comment: Performed at [facility], [place] [address], [place], [place] [address]  Hemoglobin and hematocrit, blood     Status: Abnormal   Collection Time: 09/15/18  9:20 PM  Result Value Ref Range   Hemoglobin 8.3 (L) 12.0 - 15.0 g/dL   HCT 24.3 (L) 36.0 - 46.0 %    Comment: Performed at [facility], [place] [address] [place], [place] [address]  Basic metabolic panel     Status: Abnormal   Collection Time: 09/16/18  4:04 AM  Result Value Ref Range   Sodium 124 (L) 135 - 145  mmol/L    Comment: [place] CHECK NOTED   Potassium 4.1 3.5 - 5.1 mmol/L   Chloride 90 (L) 98 - 111 mmol/L   CO2 25 22 - 32 mmol/L   Glucose, Bld 133 (H) 70 - 99 mg/dL   BUN 42 (H) 6 - 20 mg/dL   Creatinine, Ser 1.56 (H) 0.44 - 1.00 mg/dL   Calcium 7.8 (L) 8.9 - 10.3 mg/dL   GFR calc non Af Amer 35 (L) >60 mL/min   GFR calc Af Amer 41 (L) >60 mL/min    Comment: (NOTE) The eGFR has been calculated using the CKD EPI equation. This calculation has not been validated in all clinical situations. eGFR's persistently <60 mL/min signify possible Chronic Kidney Disease.    Anion gap 9 5 - 15    Comment: Performed at [facility] [address] [place], [place] [address]  Hemoglobin and hematocrit, blood     Status: Abnormal   Collection Time: 09/16/18  8:00 AM  Result Value Ref Range   Hemoglobin 7.8 (L) 12.0 - 15.0 g/dL   HCT 23.6 (L) 36.0 - 46.0 %    Comment: Performed at [facility], [place] [address], [place], [place] [address]  Osmolality, urine     Status: None   Collection Time: 09/16/18 11:16 AM  Result Value Ref Range   Osmolality, Ur 311 300 - 900 mOsm/kg    Comment: Performed at [facility] [address], [place], [place] [address]  Sodium, urine, random     Status:  None   Collection Time: 09/16/18 11:16 AM  Result Value Ref Range   Sodium, Ur <10 mmol/L    Comment: Performed at [facility] [address], [place], [place] [address]  Osmolality     Status: None   Collection Time: 09/16/18 12:00 PM  Result Value Ref Range   Osmolality 276 275 - 295 mOsm/kg    Comment: Performed at [facility], [place] [address], [place], [place] [address]  CBC     Status: Abnormal   Collection Time: 09/16/18 10:18 PM  Result Value Ref Range   WBC 38.8 (H) 4.0 - 10.5 K/uL   RBC 2.82 (L) 3.87 - 5.11 MIL/uL   Hemoglobin 8.3 (L) 12.0 - 15.0 g/dL   HCT 25.0 (L) 36.0 - 46.0 %   MCV 88.7 80.0 - 100.0 fL   MCH 29.4 26.0 - 34.0 pg   MCHC 33.2 30.0 - 36.0 g/dL    RDW 14.5 11.5 - 15.5 %   Platelets 329 150 - 400 K/uL   nRBC 0.0 0.0 - 0.2 %    Comment: Performed at [facility], [place] [address] [place], [place] [address]  CBC     Status: Abnormal   Collection Time: 09/17/18  8:08 AM  Result Value Ref Range   WBC 34.4 (H) 4.0 - 10.5 K/uL   RBC 2.68 (L) 3.87 - 5.11 MIL/uL   Hemoglobin 7.7 (L) 12.0 - 15.0 g/dL   HCT 24.2 (L) 36.0 - 46.0 %   MCV 90.3 80.0 - 100.0 fL   MCH 28.7 26.0 - 34.0 pg   MCHC 31.8 30.0 - 36.0 g/dL   RDW 14.6 11.5 - 15.5 %   Platelets 347 150 - 400 K/uL   nRBC 0.0 0.0 - 0.2 %    Comment: Performed at [facility], [place] [address] [place], [place] [address]  Basic metabolic panel     Status: Abnormal   Collection Time: 09/17/18  8:08 AM  Result Value Ref Range   Sodium 131 (L) 135 - 145 mmol/L   Potassium 4.1 3.5 - 5.1 mmol/L   Chloride 95 (L) 98 - 111 mmol/L   CO2 25 22 - 32 mmol/L   Glucose, Bld 122 (H) 70 - 99 mg/dL   BUN 36 (H) 6 - 20 mg/dL   Creatinine, Ser 1.29 (H) 0.44 - 1.00 mg/dL   Calcium 8.4 (L) 8.9 - 10.3 mg/dL   GFR calc non Af Amer 44 (L) >60 mL/min   GFR calc Af Amer 51 (L) >60 mL/min    Comment: (NOTE) The eGFR has been calculated using the CKD EPI equation. This calculation has not been validated in all clinical situations. eGFR's persistently <60 mL/min signify possible Chronic Kidney Disease.    Anion gap 11 5 - 15    Comment: Performed at [facility] [address] [place], [place] [address]  Basic metabolic panel     Status: Abnormal   Collection Time: 09/18/18  4:11 AM  Result Value Ref Range   Sodium 135 135 - 145 mmol/L   Potassium 4.3 3.5 - 5.1 mmol/L   Chloride 95 (L) 98 - 111 mmol/L   CO2 28 22 - 32 mmol/L   Glucose, Bld 116 (H) 70 - 99 mg/dL   BUN 37 (H) 6 - 20 mg/dL   Creatinine, Ser 1.17 (H) 0.44 - 1.00 mg/dL   Calcium 8.5 (L) 8.9 - 10.3 mg/dL   GFR calc non  Af Amer 50 (L) >60 mL/min   GFR calc Af Amer 58 (L) >60 mL/min    Comment: (NOTE) The eGFR has been  calculated using the CKD EPI equation. This calculation has not been validated in all clinical situations. eGFR's persistently <60 mL/min signify possible Chronic Kidney Disease.    Anion gap 12 5 - 15    Comment: Performed at [facility] [address], [place], [place] [address]  CBC     Status: Abnormal   Collection Time: 09/18/18  4:11 AM  Result Value Ref Range   WBC 26.7 (H) 4.0 - 10.5 K/uL   RBC 2.83 (L) 3.87 - 5.11 MIL/uL   Hemoglobin 7.9 (L) 12.0 - 15.0 g/dL   HCT 25.8 (L) 36.0 - 46.0 %   MCV 91.2 80.0 - 100.0 fL   MCH 27.9 26.0 - 34.0 pg   MCHC 30.6 30.0 - 36.0 g/dL   RDW 14.5 11.5 - 15.5 %   Platelets 372 150 - 400 K/uL   nRBC 0.0 0.0 - 0.2 %    Comment: Performed at [facility], [place] [address] [place], [place] [address]  Basic metabolic panel     Status: Abnormal   Collection Time: 09/19/18  3:17 AM  Result Value Ref Range   Sodium 135 135 - 145 mmol/L   Potassium 4.1 3.5 - 5.1 mmol/L   Chloride 95 (L) 98 - 111 mmol/L   CO2 30 22 - 32 mmol/L   Glucose, Bld 142 (H) 70 - 99 mg/dL   BUN 23 (H) 6 - 20 mg/dL   Creatinine, Ser 1.13 (H) 0.44 - 1.00 mg/dL   Calcium 8.8 (L) 8.9 - 10.3 mg/dL   GFR calc non Af Amer 52 (L) >60 mL/min   GFR calc Af Amer >60 >60 mL/min    Comment: (NOTE) The eGFR has been calculated using the CKD EPI equation. This calculation has not been validated in all clinical situations. eGFR's persistently <60 mL/min signify possible Chronic Kidney Disease.    Anion gap 10 5 - 15    Comment: Performed at [facility] [address] [place], [place] [address]  CBC     Status: Abnormal   Collection Time: 09/19/18  3:17 AM  Result Value Ref Range   WBC 25.0 (H) 4.0 - 10.5 K/uL   RBC 2.82 (L) 3.87 - 5.11 MIL/uL   Hemoglobin 8.1 (L) 12.0 - 15.0 g/dL   HCT 25.7 (L) 36.0 - 46.0 %   MCV 91.1 80.0 - 100.0 fL   MCH 28.7 26.0 - 34.0 pg   MCHC 31.5 30.0 - 36.0 g/dL   RDW 14.5 11.5 - 15.5 %   Platelets 372 150 - 400 K/uL   nRBC  0.0 0.0 - 0.2 %    Comment: Performed at [facility], [place] [place] [address], [place], [place] [address]  Prepare RBC     Status: None   Collection Time: 09/19/18 11:54 AM  Result Value Ref Range   Order Confirmation      ORDER PROCESSED BY BLOOD BANK Performed at [facility], [place] [address] [place], [place] [address]   Type and screen [place]     Status: None   Collection Time: 09/19/18  1:52 PM  Result Value Ref Range   ABO/RH(D) A POS    Antibody Screen NEG    Sample Expiration 09/22/2018    Unit Number [id number]    Blood Component  Type RED CELLS,LR    Unit division 00    Status of Unit ISSUED,FINAL    Transfusion Status OK TO TRANSFUSE    Crossmatch Result Compatible    Unit Number [id number]    Blood Component Type RED CELLS,LR    Unit division 00    Status of Unit ISSUED,FINAL    Transfusion Status OK TO TRANSFUSE    Crossmatch Result      Compatible Performed at [facility], [place] [address], [place], [place] [address]   BPAM RBC     Status: None   Collection Time: 09/19/18  1:52 PM  Result Value Ref Range   ISSUE DATE / TIME 390300923300    Blood Product Unit Number [id number]    PRODUCT CODE B6389H73    Unit Type and Rh 6200    Blood Product Expiration Date 428768115726    ISSUE DATE / TIME 203559741638    Blood Product Unit Number [id number]    PRODUCT CODE Q8250I37    Unit Type and Rh 6200    Blood Product Expiration Date 048889169450   Basic metabolic panel     Status: Abnormal   Collection Time: 09/20/18  4:28 AM  Result Value Ref Range   Sodium 137 135 - 145 mmol/L   Potassium 4.2 3.5 - 5.1 mmol/L   Chloride 96 (L) 98 - 111 mmol/L   CO2 30 22 - 32 mmol/L   Glucose, Bld 123 (H) 70 - 99 mg/dL   BUN 22 (H) 6 - 20 mg/dL   Creatinine, Ser 0.82 0.44 - 1.00 mg/dL   Calcium 8.5 (L) 8.9 - 10.3 mg/dL   GFR calc non Af Amer >60 >60 mL/min   GFR calc Af Amer >60 >60 mL/min    Comment: (NOTE) The eGFR has been  calculated using the CKD EPI equation. This calculation has not been validated in all clinical situations. eGFR's persistently <60 mL/min signify possible Chronic Kidney Disease.    Anion gap 11 5 - 15    Comment: Performed at [place] [address] [place], [place] [address]  CBC     Status: Abnormal   Collection Time: 09/20/18  4:28 AM  Result Value Ref Range   WBC 25.1 (H) 4.0 - 10.5 K/uL   RBC 2.97 (L) 3.87 - 5.11 MIL/uL   Hemoglobin 8.6 (L) 12.0 - 15.0 g/dL   HCT 27.3 (L) 36.0 - 46.0 %   MCV 91.9 80.0 - 100.0 fL   MCH 29.0 26.0 - 34.0 pg   MCHC 31.5 30.0 - 36.0 g/dL   RDW 14.1 11.5 - 15.5 %   Platelets 342 150 - 400 K/uL   nRBC 0.0 0.0 - 0.2 %    Comment: Performed at [facility], [place] [address], [place], [place] [address]  Prepare RBC     Status: None   Collection Time: 09/20/18  8:19 AM  Result Value Ref Range   Order Confirmation      ORDER PROCESSED BY BLOOD BANK Performed at [facility], [place] [address], [place], [place] [address]   MRSA PCR Screening     Status: Abnormal   Collection Time: 09/20/18  5:17 PM  Result Value Ref Range   MRSA by PCR POSITIVE (A) NEGATIVE    Comment:        The GeneXpert MRSA Assay (FDA approved for NASAL specimens only), is one component of a comprehensive MRSA colonization surveillance program. It is not intended to  diagnose MRSA infection nor to guide or monitor treatment for MRSA infections. RESULT CALLED TO, READ BACK BY AND VERIFIED WITH: Felipa Evener RN [phone] 09/20/2018 [place] Performed at [facility], [place] [address], [place], [place] [address]   ABO/Rh     Status: None   Collection Time: 09/20/18  8:07 PM  Result Value Ref Range   ABO/RH(D)      A POS Performed at [facility], [place] [address] [place], [place] [address]   Type and screen [place]     Status: None   Collection Time: 09/20/18  8:11 PM  Result Value Ref Range   ABO/RH(D)  A POS    Antibody Screen NEG    Sample Expiration 09/23/2018    Unit Number [id number]    Blood Component Type RED CELLS,LR    Unit division 00    Status of Unit ISSUED,FINAL    Transfusion Status OK TO TRANSFUSE    Crossmatch Result Compatible    Unit Number [id number]    Blood Component Type RED CELLS,LR    Unit division 00    Status of Unit ISSUED,FINAL    Transfusion Status OK TO TRANSFUSE    Crossmatch Result Compatible    Unit Number [id number]    Blood Component Type RED CELLS,LR    Unit division 00    Status of Unit ISSUED,FINAL    Transfusion Status OK TO TRANSFUSE    Crossmatch Result Compatible    Unit Number [id number]    Blood Component Type RED CELLS,LR    Unit division 00    Status of Unit REL FROM [facility]    Transfusion Status OK TO TRANSFUSE    Crossmatch Result      Compatible Performed at [place] [address], [place], [place] [address]   BPAM RBC     Status: None   Collection Time: 09/20/18  8:11 PM  Result Value Ref Range   ISSUE DATE / TIME 144315400867    Blood Product Unit Number [id number]    PRODUCT CODE E0336V00    Unit Type and Rh 6200    Blood Product Expiration Date 580998338250    ISSUE DATE / TIME 539767341937    Blood Product Unit Number [id number]    PRODUCT CODE M4268T41    Unit Type and Rh 9622    Blood Product Expiration Date 297989211941    ISSUE DATE / TIME 740814481856    Blood Product Unit Number [id number]    PRODUCT CODE I5027X41    Unit Type and Rh 2878    Blood Product Expiration Date 676720947096    Blood Product Unit Number [id number]    Unit Type and Rh 6200    Blood Product Expiration Date 503546568127   Magnesium     Status: Abnormal   Collection Time: 09/21/18  3:56 AM  Result Value Ref Range   Magnesium 1.5 (L) 1.7 - 2.4 mg/dL    Comment: Performed at [facility], [place] [address], [place], [place] [address]  CBC     Status: Abnormal    Collection Time: 09/21/18  3:56 AM  Result Value Ref Range   WBC 23.6 (H) 4.0 - 10.5 K/uL   RBC 3.48 (L) 3.87 - 5.11 MIL/uL   Hemoglobin 10.2 (L) 12.0 - 15.0 g/dL   HCT 32.9 (L) 36.0 - 46.0 %   MCV 94.5 80.0 - 100.0 fL   MCH 29.3 26.0 - 34.0 pg  MCHC 31.0 30.0 - 36.0 g/dL   RDW 14.6 11.5 - 15.5 %   Platelets 308 150 - 400 K/uL   nRBC 0.0 0.0 - 0.2 %    Comment: Performed at [facility], [place] [address], [place], [place] [address]  Basic metabolic panel     Status: Abnormal   Collection Time: 09/22/18  3:21 AM  Result Value Ref Range   Sodium 142 135 - 145 mmol/L   Potassium 3.9 3.5 - 5.1 mmol/L   Chloride 96 (L) 98 - 111 mmol/L   CO2 36 (H) 22 - 32 mmol/L   Glucose, Bld 120 (H) 70 - 99 mg/dL   BUN 15 6 - 20 mg/dL   Creatinine, Ser 0.69 0.44 - 1.00 mg/dL   Calcium 9.0 8.9 - 10.3 mg/dL   GFR calc non Af Amer >60 >60 mL/min   GFR calc Af Amer >60 >60 mL/min    Comment: (NOTE) The eGFR has been calculated using the CKD EPI equation. This calculation has not been validated in all clinical situations. eGFR's persistently <60 mL/min signify possible Chronic Kidney Disease.    Anion gap 10 5 - 15    Comment: Performed at [facility], [place] [address], [place], [place] [address]  CBC with Differential/Platelet     Status: Abnormal   Collection Time: 09/22/18  3:21 AM  Result Value Ref Range   WBC 18.8 (H) 4.0 - 10.5 K/uL   RBC 3.48 (L) 3.87 - 5.11 MIL/uL   Hemoglobin 10.0 (L) 12.0 - 15.0 g/dL   HCT 33.1 (L) 36.0 - 46.0 %   MCV 95.1 80.0 - 100.0 fL   MCH 28.7 26.0 - 34.0 pg   MCHC 30.2 30.0 - 36.0 g/dL   RDW 14.9 11.5 - 15.5 %   Platelets 308 150 - 400 K/uL   nRBC 0.0 0.0 - 0.2 %   Neutrophils Relative % 77 %   Neutro Abs 14.6 (H) 1.7 - 7.7 K/uL   Lymphocytes Relative 8 %   Lymphs Abs 1.5 0.7 - 4.0 K/uL   Monocytes Relative 7 %   Monocytes Absolute 1.3 (H) 0.1 - 1.0 K/uL   Eosinophils Relative 2 %   Eosinophils Absolute 0.3 0.0 - 0.5  K/uL   Basophils Relative 1 %   Basophils Absolute 0.1 0.0 - 0.1 K/uL   Immature Granulocytes 5 %   Abs Immature Granulocytes 0.96 (H) 0.00 - 0.07 K/uL    Comment: Performed at [facility], [place] [address] [place], [place] [address]  Prepare RBC     Status: None   Collection Time: 09/22/18 11:40 AM  Result Value Ref Range   Order Confirmation      ORDER PROCESSED BY BLOOD BANK Performed at [address] [place], [place] [address]   I-STAT 4, (NA,K, GLUC, HGB,HCT)     Status: Abnormal   Collection Time: 09/22/18  1:19 PM  Result Value Ref Range   Sodium 138 135 - 145 mmol/L   Potassium 3.7 3.5 - 5.1 mmol/L   Glucose, Bld 120 (H) 70 - 99 mg/dL   HCT 23.0 (L) 36.0 - 46.0 %   Hemoglobin 7.8 (L) 12.0 - 15.0 g/dL  I-STAT 4, (NA,K, GLUC, HGB,HCT)     Status: Abnormal   Collection Time: 09/22/18  2:09 PM  Result Value Ref Range   Sodium 139 135 - 145 mmol/L   Potassium 3.7 3.5 - 5.1 mmol/L   Glucose,  Bld 168 (H) 70 - 99 mg/dL   HCT 24.0 (L) 36.0 - 46.0 %   Hemoglobin 8.2 (L) 12.0 - 15.0 g/dL  Prepare RBC     Status: None   Collection Time: 09/22/18  3:49 PM  Result Value Ref Range   Order Confirmation      ORDER PROCESSED BY BLOOD BANK Performed at [facility], [place] [address] [place], [place] [address]   CBC     Status: Abnormal   Collection Time: 09/22/18  8:00 PM  Result Value Ref Range   WBC 15.0 (H) 4.0 - 10.5 K/uL   RBC 3.77 (L) 3.87 - 5.11 MIL/uL   Hemoglobin 11.0 (L) 12.0 - 15.0 g/dL    Comment: DELTA CHECK NOTED POST TRANSFUSION SPECIMEN    HCT 33.6 (L) 36.0 - 46.0 %   MCV 89.1 80.0 - 100.0 fL    Comment: POST TRANSFUSION SPECIMEN REPEATED TO VERIFY DELTA CHECK NOTED    MCH 29.2 26.0 - 34.0 pg   MCHC 32.7 30.0 - 36.0 g/dL   RDW 15.5 11.5 - 15.5 %   Platelets 235 150 - 400 K/uL   nRBC 0.0 0.0 - 0.2 %    Comment: Performed at [facility], [place] [address],  [place], [place] [address]  Basic metabolic panel     Status: Abnormal   Collection Time: 09/22/18  8:00 PM  Result Value Ref Range   Sodium 139 135 - 145 mmol/L   Potassium 4.2 3.5 - 5.1 mmol/L   Chloride 101 98 - 111 mmol/L   CO2 31 22 - 32 mmol/L   Glucose, Bld 257 (H) 70 - 99 mg/dL   BUN 16 6 - 20 mg/dL   Creatinine, Ser 1.02 (H) 0.44 - 1.00 mg/dL   Calcium 7.7 (L) 8.9 - 10.3 mg/dL   GFR calc non Af Amer 59 (L) >60 mL/min   GFR calc Af Amer >60 >60 mL/min    Comment: (NOTE) The eGFR has been calculated using the CKD EPI equation. This calculation has not been validated in all clinical situations. eGFR's persistently <60 mL/min signify possible Chronic Kidney Disease.    Anion gap 7 5 - 15    Comment: Performed at [facility], [place] [address] [place], [place] [address]  Basic metabolic panel     Status: Abnormal   Collection Time: 09/23/18  8:09 AM  Result Value Ref Range   Sodium 138 135 - 145 mmol/L   Potassium 4.1 3.5 - 5.1 mmol/L   Chloride 101 98 - 111 mmol/L   CO2 29 22 - 32 mmol/L   Glucose, Bld 136 (H) 70 - 99 mg/dL   BUN 18 6 - 20 mg/dL   Creatinine, Ser 1.09 (H) 0.44 - 1.00 mg/dL   Calcium 7.8 (L) 8.9 - 10.3 mg/dL   GFR calc non Af Amer 54 (L) >60 mL/min   GFR calc Af Amer >60 >60 mL/min    Comment: (NOTE) The eGFR has been calculated using the CKD EPI equation. This calculation has not been validated in all clinical situations. eGFR's persistently <60 mL/min signify possible Chronic Kidney Disease.    Anion gap 8 5 - 15    Comment: Performed at [facility], [place] [address] [place], [place] [address]  CBC     Status: Abnormal   Collection Time: 09/23/18  9:11 AM  Result Value Ref Range   WBC 19.5 (H) 4.0 - 10.5 K/uL   RBC  3.79 (L) 3.87 - 5.11 MIL/uL   Hemoglobin 11.0 (L) 12.0 - 15.0 g/dL   HCT 34.3 (L) 36.0 - 46.0 %   MCV 90.5 80.0 - 100.0 fL   MCH 29.0 26.0 - 34.0 pg   MCHC 32.1 30.0 - 36.0 g/dL   RDW 15.7 (H) 11.5 -  15.5 %   Platelets 261 150 - 400 K/uL   nRBC 0.0 0.0 - 0.2 %    Comment: Performed at [facility], [place] [address], [place], [place] [address]  CBC     Status: Abnormal   Collection Time: 09/24/18  3:12 AM  Result Value Ref Range   WBC 15.8 (H) 4.0 - 10.5 K/uL   RBC 3.43 (L) 3.87 - 5.11 MIL/uL   Hemoglobin 9.9 (L) 12.0 - 15.0 g/dL   HCT 30.7 (L) 36.0 - 46.0 %   MCV 89.5 80.0 - 100.0 fL   MCH 28.9 26.0 - 34.0 pg   MCHC 32.2 30.0 - 36.0 g/dL   RDW 15.0 11.5 - 15.5 %   Platelets 279 150 - 400 K/uL   nRBC 0.0 0.0 - 0.2 %    Comment: Performed at [facility], [place] [address], [place], [place] [address]  Basic metabolic panel     Status: Abnormal   Collection Time: 09/24/18  3:12 AM  Result Value Ref Range   Sodium 138 135 - 145 mmol/L   Potassium 3.8 3.5 - 5.1 mmol/L   Chloride 102 98 - 111 mmol/L   CO2 30 22 - 32 mmol/L   Glucose, Bld 108 (H) 70 - 99 mg/dL   BUN 20 6 - 20 mg/dL   Creatinine, Ser 1.36 (H) 0.44 - 1.00 mg/dL   Calcium 8.0 (L) 8.9 - 10.3 mg/dL   GFR calc non Af Amer 42 (L) >60 mL/min   GFR calc Af Amer 48 (L) >60 mL/min    Comment: (NOTE) The eGFR has been calculated using the CKD EPI equation. This calculation has not been validated in all clinical situations. eGFR's persistently <60 mL/min signify possible Chronic Kidney Disease.    Anion gap 6 5 - 15    Comment: Performed at [facility], [place] [address], [place], [place] [address]  CBC     Status: Abnormal   Collection Time: 09/24/18  8:24 AM  Result Value Ref Range   WBC 17.3 (H) 4.0 - 10.5 K/uL   RBC 3.88 3.87 - 5.11 MIL/uL   Hemoglobin 11.2 (L) 12.0 - 15.0 g/dL   HCT 34.8 (L) 36.0 - 46.0 %   MCV 89.7 80.0 - 100.0 fL   MCH 28.9 26.0 - 34.0 pg   MCHC 32.2 30.0 - 36.0 g/dL   RDW 15.0 11.5 - 15.5 %   Platelets 294 150 - 400 K/uL   nRBC 0.0 0.0 - 0.2 %    Comment: Performed at [facility], [place] [address] [place], [place]  [address]  Creatinine, urine, random     Status: None   Collection Time: 09/24/18  9:13 AM  Result Value Ref Range   Creatinine, Urine 37.16 mg/dL    Comment: Performed at [facility], [place] [address], [place], [place] [address]  Sodium, urine, random     Status: None   Collection Time: 09/24/18  9:13 AM  Result Value Ref Range   Sodium, Ur 26 mmol/L    Comment: Performed at [facility], [place] [address] [place], [place] [address]  Basic metabolic panel     Status: Abnormal   Collection Time: 09/25/18  4:02 AM  Result Value Ref Range   Sodium 137 135 - 145 mmol/L   Potassium 4.1 3.5 - 5.1 mmol/L   Chloride 101 98 - 111 mmol/L   CO2 26 22 - 32 mmol/L   Glucose, Bld 97 70 - 99 mg/dL   BUN 22 (H) 6 - 20 mg/dL   Creatinine, Ser 1.20 (H) 0.44 - 1.00 mg/dL   Calcium 8.0 (L) 8.9 - 10.3 mg/dL   GFR calc non Af Amer 48 (L) >60 mL/min   GFR calc Af Amer 56 (L) >60 mL/min    Comment: (NOTE) The eGFR has been calculated using the CKD EPI equation. This calculation has not been validated in all clinical situations. eGFR's persistently <60 mL/min signify possible Chronic Kidney Disease.    Anion gap 10 5 - 15    Comment: Performed at [facility], [place] [address] [place], [place] [address]  CBC     Status: Abnormal   Collection Time: 09/25/18  4:02 AM  Result Value Ref Range   WBC 16.9 (H) 4.0 - 10.5 K/uL   RBC 3.55 (L) 3.87 - 5.11 MIL/uL   Hemoglobin 10.3 (L) 12.0 - 15.0 g/dL   HCT 32.0 (L) 36.0 - 46.0 %   MCV 90.1 80.0 - 100.0 fL   MCH 29.0 26.0 - 34.0 pg   MCHC 32.2 30.0 - 36.0 g/dL   RDW 14.6 11.5 - 15.5 %   Platelets 305 150 - 400 K/uL   nRBC 0.0 0.0 - 0.2 %    Comment: Performed at [facility], [place] [address] [place], [place] [address]  Amylase, pleural or peritoneal fluid     Status: None   Collection Time: 09/25/18  8:46 AM  Result Value Ref Range   Amylase, Fluid 40 U/L    Comment: NO NORMAL  RANGE ESTABLISHED FOR THIS TEST Performed at [facility], [address], [place], [place] [address]    Fluid Type-FAMY JP DRAINAGE     Comment: Performed at [facility], [place] [address], [place], [place] [address]  Triglycerides, Body Fluid     Status: None   Collection Time: 09/25/18  8:46 AM  Result Value Ref Range   Triglycerides, Fluid 588 Not Estab. mg/dL    Comment: (NOTE) The reference intervals and other method performance specifications have not been established for this test. The test result should be integrated into the clinical context for interpretation. The reference interval(s) and other method performance specifications have not been established for this body fluid. The test result must be integrated into the clinical context for interpretation. Performed At: [facility] [place], [place] [id number] Rush Farmer MD VQ:[id number]    Fluid Type-FTRIG JP DRAINAGE     Comment: Performed at [place] [address], [place], [place] [address]  Lipase, Fluid     Status: None   Collection Time: 09/25/18  8:46 AM  Result Value Ref Range   Lipase-Fluid 21 U/L    Comment: (NOTE) INTERPRETIVE INFORMATION: Lipase, Fluid For information on body fluid reference ranges and/or interpretive guidance visit [email or web address] Test developed and characteristics determined by [facility] See Compliance Statement B: [email or web address] Performed At: [facility] [address] [place], [place] [id number] Esau Grew MD PJ:[id number]    Source of Sample JP DRAINAGE     Comment: Performed at [facility]  [place] [address], [place], [place] [address]  CBC w/Diff     Status: Abnormal   Collection Time: 10/30/18  1:45 PM  Result Value Ref Range   WBC 6.4 4.0 - 10.5 K/uL   RBC 3.65 (L) 3.87 - 5.11 Mil/uL   Hemoglobin 10.5 (L) 12.0 - 15.0 g/dL   HCT 31.6 (L)  36.0 - 46.0 %   MCV 86.6 78.0 - 100.0 fl   MCHC 33.3 30.0 - 36.0 g/dL   RDW 14.4 11.5 - 15.5 %   Platelets 282.0 150.0 - 400.0 K/uL   Neutrophils Relative % 72.4 43.0 - 77.0 %   Lymphocytes Relative 14.6 12.0 - 46.0 %   Monocytes Relative 7.6 3.0 - 12.0 %   Eosinophils Relative 4.2 0.0 - 5.0 %   Basophils Relative 1.2 0.0 - 3.0 %   Neutro Abs 4.6 1.4 - 7.7 K/uL   Lymphs Abs 0.9 0.7 - 4.0 K/uL   Monocytes Absolute 0.5 0.1 - 1.0 K/uL   Eosinophils Absolute 0.3 0.0 - 0.7 K/uL   Basophils Absolute 0.1 0.0 - 0.1 K/uL    Ct Renal Stone Study Result Date: 09/05/2018 IMPRESSION: 1. Very large hemorrhagic mass arising from the left kidney, highly worrisome for renal cell carcinoma. These results were called by telephone at the time of interpretation on 09/05/2018 at 10:34 am to Dr. Jola Schmidt , who verbally acknowledged these results. 2. High attenuation within the left renal pelvis is indicative of hemorrhage/clot, with probable mild associated left hydronephrosis. 3. Small ascites. 4. Small left pleural effusion. 5.  Aortic atherosclerosis (ICD10-170.0). Electronically Signed   By: Lorin Picket M.D.   On: 09/05/2018 10:34    ROS: 14 pt review of systems performed and negative (unless mentioned in an HPI)  Objective: BP 126/90 (BP Location: Right Arm, Patient Position: Sitting, Cuff Size: Normal)   Pulse 84   Temp 98.3 F (36.8 C)   Resp 20   Ht 5' (1.524 m)   Wt 99 lb 3.2 oz (45 kg)   SpO2 96%   BMI 19.37 kg/m  Gen: Afebrile. No acute distress. Nontoxic in appearance, well-developed, well-nourished,  Pleasant Caucasian female.  HENT: AT. [place].  MMM, no oral lesions, adequate dentition.no Cough on exam, no hoarseness on exam. Eyes:Pupils Equal Round Reactive to light, Extraocular movements intact,  Conjunctiva without redness, discharge or icterus. Neck/lymp/endocrine: Supple,no lymphadenopathy CV: RRR no murmur, no edema, +2/4 P posterior tibialis pulses. Chest: CTAB, no wheeze,  rhonchi or crackles.  Abd: Soft. NTND. BS present. Skin: no rashes, purpura or petechiae.  Neuro/Msk: Normal gait. PERLA. EOMi. Alert. Oriented x3.   Psych: Normal affect, dress and demeanor. Normal speech. Normal thought content and judgment.  Assessment/plan: Sharon Henson is a 57 y.o. female present for EST Anemia, unspecified type/Renal cell carcinoma of left kidney (HCC)/Solitary kidney, acquired - Check CBC today to ensure she is maintaining blood levels.  - referral to Nephrology placed.  - CBC w/Diff  Essential hypertension - stable. Refills provided on metoprolol 12.5 mg BID.  - F/U 6 mos. (Can be her CPE- if scheduled appropriately)    Return in about 6 months (around 04/30/2019) for CPE w/ PAP.  Greater than 45 minutes was spent with patient, greater than 50% of that time was spent face-to-face with patient counseling and coordinating care, reviewing records.    Note is dictated utilizing voice recognition software. Although note has been proof read prior to signing, occasional typographical errors still can be  missed. If any questions arise, please do not hesitate to call for verification.  Electronically signed by: Howard Pouch, DO [place]

## 2018-11-02 ENCOUNTER — Encounter: Payer: Self-pay | Admitting: Family Medicine

## 2018-11-04 ENCOUNTER — Ambulatory Visit: Payer: BLUE CROSS/BLUE SHIELD | Admitting: Neurology

## 2018-11-04 ENCOUNTER — Encounter: Payer: Self-pay | Admitting: Neurology

## 2018-11-04 VITALS — BP 133/86 | HR 74 | Ht 60.0 in | Wt 99.5 lb

## 2018-11-04 DIAGNOSIS — R1032 Left lower quadrant pain: Secondary | ICD-10-CM | POA: Diagnosis not present

## 2018-11-04 DIAGNOSIS — G588 Other specified mononeuropathies: Secondary | ICD-10-CM | POA: Insufficient documentation

## 2018-11-04 DIAGNOSIS — R29898 Other symptoms and signs involving the musculoskeletal system: Secondary | ICD-10-CM

## 2018-11-04 HISTORY — DX: Other symptoms and signs involving the musculoskeletal system: R29.898

## 2018-11-04 MED ORDER — GABAPENTIN 100 MG PO CAPS: ORAL_CAPSULE | ORAL | 5 refills | Status: DC

## 2018-11-04 NOTE — Progress Notes (Signed)
[facility]  PATIENT: Sharon Henson DOB: 01/01/1961  REFERRING DOCTOR OR PCP:    Dr. Link Snuffer. SOURCE: Patient, notes from Dr. Gloriann Loan, laboratory reports, imaging reports.  _________________________________   HISTORICAL  CHIEF COMPLAINT:  Chief Complaint  Patient presents with  . New Patient (Initial Visit)    RM 30 with husband. Paper referral from Link Snuffer, MD for numbness/weakness of the left side. Started after she had nephrectomy 09/22/18.    HISTORY OF PRESENT ILLNESS:  I had the pleasure of seeing your patient, Sharon Henson, at [facility] for a neurologic consultation regarding her numbness.  She is a 57 year old woman who began to experience numbness and weakness following a nephrectomy on 09/22/2018.       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  The tumor was 7 pounds and she reports being told that the tumor was difficult to remove.    Since then, she has had numbness in the left hip, groin and thigh.  She noted discomfort while still in the hospital.  She gets a tingling sensation.   Numbness is constant.   Nothing makes it worse but pain occurs when she lifts her thigh up (like to put on a sock or shoes).  At times, numbness seems to go below the knee.    She notes weakness in her proximal leg, especially lifting the leg.    She notes no weakness in her feet.     Symptoms have been stable the past few weeks.   Also of note, she was on bedrest before the operation and had lost weight due to low fat low fat diet.     She had unstable BP before the surgery.       REVIEW OF SYSTEMS: Constitutional: No fevers, chills, sweats, or change in appetite Eyes: No visual changes, double vision, eye pain Ear, nose and throat: No hearing loss, ear pain, nasal congestion, sore throat Cardiovascular: No chest pain, palpitations Respiratory: No shortness of breath at rest or with exertion.    No wheezes GastrointestinaI: No nausea, vomiting, diarrhea, abdominal pain, fecal incontinence Genitourinary: No dysuria, urinary retention or frequency.  No nocturia. Musculoskeletal: No neck pain, back pain Integumentary: No rash, pruritus, skin lesions Neurological: as above Psychiatric: No depression at this time.  No anxiety Endocrine: No palpitations, diaphoresis, change in appetite, change in weigh or increased thirst Hematologic/Lymphatic: No anemia, purpura, petechiae. Allergic/Immunologic: No itchy/runny eyes, nasal congestion, recent allergic reactions, rashes  ALLERGIES: No Known Allergies  HOME MEDICATIONS:  Current Outpatient Medications:  .  metoprolol tartrate (LOPRESSOR) 25 MG tablet, Take 0.5 tablets (12.5 mg total) by mouth 2 (two) times daily., Disp: 90 tablet, Rfl: 1  PAST MEDICAL HISTORY: Past Medical History:  Diagnosis Date  . Anemia 09/05/2018  . Chronic kidney disease   . Complication of anesthesia    "I don't get knocked out w/sodium pentothal" (09/09/2018)  . Depression   . Frequent UTI   . GERD (gastroesophageal reflux disease)   . Hiatal hernia   . History of blood transfusion 08/2018; 09/2018   /notes10/01/2018  . Hypertension   . Renal cell carcinoma ([place]) 09/2018   kidney  . Retroperitoneal hematoma    /notes10/01/2018    PAST SURGICAL HISTORY: Past Surgical History:  Procedure Laterality Date  . AUGMENTATION MAMMAPLASTY Bilateral 2006  . [place]; 1993  . DILATION AND CURETTAGE OF UTERUS  1979   "to remove blood clot  in my uterus"  . IR ANGIO/SPINAL LEFT  09/14/2018  . IR ANGIO/SPINAL LEFT  09/14/2018  . IR ANGIOGRAM VISCERAL SELECTIVE  09/14/2018  . IR ANGIOGRAM VISCERAL SELECTIVE  09/14/2018  . IR FLUORO GUIDE CV LINE RIGHT  09/10/2018  . IR RENAL SELECTIVE  UNI INC S&I MOD SED  09/14/2018  . IR US GUIDE VASC ACCESS RIGHT  09/10/2018  . IR US GUIDE VASC ACCESS RIGHT  09/14/2018  . NEPHRECTOMY Left 09/22/2018    Procedure: NEPHRECTOMY;  Surgeon: Lucas Mallow, MD;  Location: [facility];  Service: Urology;  Laterality: Left;  3 HRS  . UMBILICAL HERNIA REPAIR  04/08/2007    FAMILY HISTORY: History reviewed. No pertinent family history.  SOCIAL HISTORY:  Social History   Socioeconomic History  . Marital status: Married    Spouse name: Not on file  . Number of children: 3  . Years of education: Not on file  . Highest education level: Not on file  Occupational History  . Not on file  Social Needs  . Financial resource strain: Not hard at all  . Food insecurity:    Worry: Never true    Inability: Never true  . Transportation needs:    Medical: No    Non-medical: No  Tobacco Use  . Smoking status: Former Smoker    Packs/day: 0.10    Years: 2.00    Pack years: 0.20    Types: Cigarettes  . Smokeless tobacco: Never Used  . Tobacco comment: 09/09/2018 "quit smoking in the late 1980s"  Substance and Sexual Activity  . Alcohol use: Not Currently    Frequency: Never    Comment: 09/09/2018 "might have 1 drink/month"  . Drug use: Never  . Sexual activity: Yes    Partners: Male  Lifestyle  . Physical activity:    Days per week: 7 days    Minutes per session: 60 min  . Stress: Not at all  Relationships  . Social connections:    Talks on phone: More than three times a week    Gets together: More than three times a week    Attends religious service: Never    Active member of club or organization: No    Attends meetings of clubs or organizations: Never    Relationship status: Married  . Intimate partner violence:    Fear of current or ex partner: Patient refused    Emotionally abused: Patient refused    Physically abused: Patient refused    Forced sexual activity: Patient refused  Other Topics Concern  . Not on file  Social History Narrative   Marital status/children/pets: married, 3 children.    Education/employment: Some college, [employer]   Safety:      -Wears a bicycle  helmet riding a bike: Yes     -smoke alarm in the home:Yes     - wears seatbelt: Yes     - Feels safe in their relationships: Yes      Lives with husband   Caffeine use: 1 cup per day   Right handed      PHYSICAL EXAM  Vitals:   11/04/18 0812  BP: 133/86  Pulse: 74  Weight: 99 lb 8 oz (45.1 kg)  Height: 5' (1.524 m)    Body mass index is 19.43 kg/m.   General: The patient is well-developed and well-nourished and in no acute distress  Neck: The neck is supple, no carotid bruits are noted.  The neck is nontender.  Cardiovascular:  The heart has a regular rate and rhythm with a normal S1 and S2. There were no murmurs, gallops or rubs.   Skin: Extremities are without rash or edema.  Abdominal healing wound  Musculoskeletal:  Back is nontender  Neurologic Exam  Mental status: The patient is alert and oriented x 3 at the time of the examination. The patient has apparent normal recent and remote memory, with an apparently normal attention span and concentration ability.   Speech is normal.  Cranial nerves: Extraocular movements are full. Pupils are equal, round, and reactive to light and accomodation.    Facial symmetry is present. There is good facial sensation to soft touch bilaterally.Facial strength is normal.  Trapezius and sternocleidomastoid strength is normal. No dysarthria is noted.  The tongue is midline, and the patient has symmetric elevation of the soft palate. No obvious hearing deficits are noted.  Motor:  Muscle bulk is normal.   Tone is normal. Strength is  5 / 5 in all 4 extremities except 4+/5 in the left iliopsoas  Sensory: Sensory testing is intact to pinprick, soft touch and vibration sensation in the arms.  She has reduced sensation to touch and temperature in the distribution of the genitofemoral nerve on the left.  This involves the groin and lower anterolateral hip region.     Coordination: Cerebellar testing reveals good finger-nose-finger and  heel-to-shin is painful.  Gait and station: Station is normal.   Due to her recent surgery, she has a cautious gait.  She is unable to tandem walk.  The Romberg is negative.  Reflexes: Deep tendon reflexes are symmetric and normal bilaterally.   Plantar responses are flexor.    DIAGNOSTIC DATA (LABS, IMAGING, TESTING) - I reviewed patient records, labs, notes, testing and imaging myself where available.  Lab Results  Component Value Date   WBC 6.4 10/30/2018   HGB 10.5 (L) 10/30/2018   HCT 31.6 (L) 10/30/2018   MCV 86.6 10/30/2018   PLT 282.0 10/30/2018      Component Value Date/Time   NA 137 09/25/2018 0402   K 4.1 09/25/2018 0402   CL 101 09/25/2018 0402   CO2 26 09/25/2018 0402   GLUCOSE 97 09/25/2018 0402   BUN 22 (H) 09/25/2018 0402   CREATININE 1.20 (H) 09/25/2018 0402   CALCIUM 8.0 (L) 09/25/2018 0402   PROT 5.8 (L) 09/05/2018 1619   ALBUMIN 2.3 (L) 09/08/2018 0529   AST 160 (H) 09/05/2018 1619   ALT 10 09/05/2018 1619   ALKPHOS 72 09/05/2018 1619   BILITOT 1.0 09/05/2018 1619   GFRNONAA 48 (L) 09/25/2018 0402   GFRAA 56 (L) 09/25/2018 0402      ASSESSMENT AND PLAN  Genitofemoral neuralgia of left side  Weakness of left lower extremity - Plan: Ambulatory referral to Physical Therapy  Groin pain, left   In summary, Mrs. Mattie 57 year old woman began to experience left hip and groin pain after a left nephrectomy for a complex tumor mass.   Although the pain is more difficult to localize, she has numbness in the distribution of the left genitofemoral nerve.   There is probable mild weakness involving the iliopsoas muscle left.  However, due to pain she had difficulty giving full effort.    I discussed with her and her husband that her pain is most likely neuropathic from an injury to the nerve.  Medications can often help and recovery often occurs after nerve injury, though not always.  I will place her on gabapentin initially at  a low dose of 100 mg in the  morning, 100 mg in the afternoon and 200 mg at night due to her reduced kidney function but will titrate this up further based on efficacy and tolerability.  If she does not get a benefit, consider other medications such as lamotrigine or a tricyclic.  As there is only apparent mild weakness of the psoas muscle I would expect that to completely recover.  We discussed that sensory recovery is less predictable and she could have a full recovery or she may have none.  If recovery was to occur she should note some improvement within a few more months.    As the weakness is minimal and could be pain related I will hold off on an EMG/NCV at this time but consider one in the future if symptoms worsen.  Additionally, if oral medications are unable to control her pain consider referral to an anesthesiologic pain clinic for possible radiofrequency ablation.   I will see her back in couple months but she should call sooner if new or worsening neurologic symptoms.  Thank you for asking me to see Mrs. Stefanelli.  Please let me know if I can be of further assistance with her or other patients in the future.   Kristian Mogg A. Felecia Shelling, MD, [facility] 39/02/91, 3:30 AM Certified in Neurology, Clinical Neurophysiology, Sleep Medicine, Pain Medicine and Neuroimaging  [facility] [address], [place], [place] [address] [phone]

## 2018-11-17 DIAGNOSIS — R29898 Other symptoms and signs involving the musculoskeletal system: Secondary | ICD-10-CM | POA: Diagnosis not present

## 2018-11-17 DIAGNOSIS — Z905 Acquired absence of kidney: Secondary | ICD-10-CM | POA: Diagnosis not present

## 2018-11-17 DIAGNOSIS — N189 Chronic kidney disease, unspecified: Secondary | ICD-10-CM | POA: Diagnosis not present

## 2018-11-17 DIAGNOSIS — I129 Hypertensive chronic kidney disease with stage 1 through stage 4 chronic kidney disease, or unspecified chronic kidney disease: Secondary | ICD-10-CM | POA: Diagnosis not present

## 2018-11-17 DIAGNOSIS — N183 Chronic kidney disease, stage 3 (moderate): Secondary | ICD-10-CM | POA: Diagnosis not present

## 2018-11-17 DIAGNOSIS — D631 Anemia in chronic kidney disease: Secondary | ICD-10-CM | POA: Diagnosis not present

## 2018-11-18 LAB — LAB REPORT - SCANNED
Albumin: 4.2
BASO: 0 %
BASO: 1 %
BUN: 16 (ref 4–21)
Calcium: 10.4
Carbon Dioxide, Total: 29
Chloride: 103
Creat: 0.86
EGFR (Non-African Amer.): 74
EOS%: 4 %
EOS: 0 %
Ferritin: 1202
GFR CALC AF AMER: 86
Glucose: 92
HCT: 31 (ref 29–41)
Hemoglobin: 10.1
IRON: 44
Iron Saturation: 23
LYMPH#: 0.8
MCH: 28.3
MCHC: 32.5
MCV: 87 (ref 76–111)
MONOS PCT: 12
Monocytes: 0.8
Neutrophils: 4.8
Neutrophils: 71
Phosphorus: 4.4
Platelet: 267
Potassium: 4.8
RBC: 3.57 — AB (ref 3.87–5.11)
RDW: 14.6
SODIUM: 140
UIBC: 146
WBC: 6.8
lymph#: 12

## 2018-11-20 DIAGNOSIS — R29898 Other symptoms and signs involving the musculoskeletal system: Secondary | ICD-10-CM | POA: Diagnosis not present

## 2018-11-23 DIAGNOSIS — R29898 Other symptoms and signs involving the musculoskeletal system: Secondary | ICD-10-CM | POA: Diagnosis not present

## 2018-11-26 DIAGNOSIS — F339 Major depressive disorder, recurrent, unspecified: Secondary | ICD-10-CM

## 2018-11-26 DIAGNOSIS — I270 Primary pulmonary hypertension: Secondary | ICD-10-CM

## 2018-11-26 DIAGNOSIS — D619 Aplastic anemia, unspecified: Secondary | ICD-10-CM | POA: Insufficient documentation

## 2018-11-26 DIAGNOSIS — C649 Malignant neoplasm of unspecified kidney, except renal pelvis: Secondary | ICD-10-CM | POA: Insufficient documentation

## 2018-11-26 DIAGNOSIS — K219 Gastro-esophageal reflux disease without esophagitis: Secondary | ICD-10-CM | POA: Insufficient documentation

## 2018-11-26 DIAGNOSIS — Z85528 Personal history of other malignant neoplasm of kidney: Secondary | ICD-10-CM | POA: Insufficient documentation

## 2018-11-26 HISTORY — DX: Major depressive disorder, recurrent, unspecified: F33.9

## 2018-11-26 HISTORY — DX: Primary pulmonary hypertension: I27.0

## 2018-11-26 HISTORY — DX: Aplastic anemia, unspecified: D61.9

## 2018-11-27 DIAGNOSIS — R29898 Other symptoms and signs involving the musculoskeletal system: Secondary | ICD-10-CM | POA: Diagnosis not present

## 2018-12-03 ENCOUNTER — Telehealth: Payer: Self-pay | Admitting: *Deleted

## 2018-12-03 NOTE — Telephone Encounter (Signed)
Faxed signed orders back to [facility]. 2x/week for 6 weeks. Fax: [phone]. Received fax confirmation.

## 2018-12-15 DIAGNOSIS — R29898 Other symptoms and signs involving the musculoskeletal system: Secondary | ICD-10-CM | POA: Diagnosis not present

## 2018-12-18 DIAGNOSIS — R29898 Other symptoms and signs involving the musculoskeletal system: Secondary | ICD-10-CM | POA: Diagnosis not present

## 2018-12-21 DIAGNOSIS — R29898 Other symptoms and signs involving the musculoskeletal system: Secondary | ICD-10-CM | POA: Diagnosis not present

## 2018-12-23 DIAGNOSIS — C642 Malignant neoplasm of left kidney, except renal pelvis: Secondary | ICD-10-CM | POA: Diagnosis not present

## 2018-12-25 DIAGNOSIS — R29898 Other symptoms and signs involving the musculoskeletal system: Secondary | ICD-10-CM | POA: Diagnosis not present

## 2018-12-30 DIAGNOSIS — C642 Malignant neoplasm of left kidney, except renal pelvis: Secondary | ICD-10-CM | POA: Diagnosis not present

## 2018-12-30 DIAGNOSIS — K7689 Other specified diseases of liver: Secondary | ICD-10-CM | POA: Diagnosis not present

## 2018-12-30 DIAGNOSIS — R918 Other nonspecific abnormal finding of lung field: Secondary | ICD-10-CM | POA: Diagnosis not present

## 2019-01-07 DIAGNOSIS — N183 Chronic kidney disease, stage 3 (moderate): Secondary | ICD-10-CM | POA: Diagnosis not present

## 2019-01-07 DIAGNOSIS — C642 Malignant neoplasm of left kidney, except renal pelvis: Secondary | ICD-10-CM | POA: Diagnosis not present

## 2019-02-02 NOTE — Progress Notes (Signed)
PATIENT: Sharon Henson DOB: [birth year]  REASON FOR VISIT: follow up HISTORY FROM: patient  Chief Complaint  Patient presents with  . Follow-up    2 month follow up. Alone. Rm 5. Patient mentioned that she has slowly stopped taking her Gabapentin due to her hair falling out but she did mentioned that that the pinching and burning has stopped.      HISTORY OF PRESENT ILLNESS: Today 02/03/19 Sharon Henson is a 58 y.o. female here today for follow up for left hip, groin and thigh numbness and weakness. She was started on gabapentin 100mg  in am and lunch and 200mg  at night.  She reports that when taking this medication she started noticing that her hair was thinning.  She also felt very groggy and had brain fog.  She felt that it made her nose run more than usual.  She gradually weaned herself off this medication.  She reports that since discontinuation she has felt much better.  B grabbing and burning pain in her groin is gone.  She does continue to have some numbness of the left groin but that does not bother her.  She is able to do daily activities.  She does not feel that her gait is disturbed by this numbness.  She does not notice significant weakness however does feel that her right leg is stronger.  She is able to ride her horse again.   HISTORY: (copied from Dr Garth Bigness note on 11/04/2018) I had the pleasure of seeing your patient, Veleria.Peyton Najjar, at [facility] for a neurologic consultation regarding her numbness.  She is a 58 year old woman who began to experience numbness and weakness following a nephrectomy on 09/22/2018.       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  The tumor was 7 pounds and she reports being told that the tumor was difficult to remove.    Since then, she has had numbness in the left hip, groin and thigh.  She noted discomfort while still in the hospital.  She gets a tingling sensation.    Numbness is constant.   Nothing makes it worse but pain occurs when she lifts her thigh up (like to put on a sock or shoes).  At times, numbness seems to go below the knee.    She notes weakness in her proximal leg, especially lifting the leg.    She notes no weakness in her feet.     Symptoms have been stable the past few weeks.   Also of note, she was on bedrest before the operation and had lost weight due to low fat low fat diet.     She had unstable BP before the surgery.   REVIEW OF SYSTEMS: Out of a complete 14 system review of symptoms, the patient complains only of the following symptoms, runny nose, memory loss and all other reviewed systems are negative.  ALLERGIES: No Known Allergies  HOME MEDICATIONS: Outpatient Medications Prior to Visit  Medication Sig Dispense Refill  . metoprolol tartrate (LOPRESSOR) 25 MG tablet Take 0.5 tablets (12.5 mg total) by mouth 2 (two) times daily. (Patient not taking: Reported on 02/03/2019) 90 tablet 1  . gabapentin (NEURONTIN) 100 MG capsule One po qAM, One po qPM and two po qHS (Patient not taking: Reported on 02/03/2019) 120 capsule 5   No facility-administered medications prior to visit.     PAST MEDICAL HISTORY: Past Medical History:  Diagnosis Date  . Anemia  09/05/2018  . Chronic kidney disease   . Complication of anesthesia    "I don't get knocked out w/sodium pentothal" (09/09/2018)  . Depression   . Frequent UTI   . GERD (gastroesophageal reflux disease)   . Hiatal hernia   . History of blood transfusion 08/2018; 09/2018   /notes10/01/2018  . Hypertension   . Renal cell carcinoma ([place]) 09/2018   kidney  . Retroperitoneal hematoma    /notes10/01/2018    PAST SURGICAL HISTORY: Past Surgical History:  Procedure Laterality Date  . AUGMENTATION MAMMAPLASTY Bilateral 2006  . [place]; 1993  . DILATION AND CURETTAGE OF UTERUS  1979   "to remove blood clot in my uterus"  . IR ANGIO/SPINAL LEFT  09/14/2018  . IR  ANGIO/SPINAL LEFT  09/14/2018  . IR ANGIOGRAM VISCERAL SELECTIVE  09/14/2018  . IR ANGIOGRAM VISCERAL SELECTIVE  09/14/2018  . IR FLUORO GUIDE CV LINE RIGHT  09/10/2018  . IR RENAL SELECTIVE  UNI INC S&I MOD SED  09/14/2018  . IR US GUIDE VASC ACCESS RIGHT  09/10/2018  . IR US GUIDE VASC ACCESS RIGHT  09/14/2018  . NEPHRECTOMY Left 09/22/2018   Procedure: NEPHRECTOMY;  Surgeon: Lucas Mallow, MD;  Location: [facility];  Service: Urology;  Laterality: Left;  3 HRS  . UMBILICAL HERNIA REPAIR  04/08/2007    FAMILY HISTORY: History reviewed. No pertinent family history.  SOCIAL HISTORY: Social History   Socioeconomic History  . Marital status: Married    Spouse name: Not on file  . Number of children: 3  . Years of education: Not on file  . Highest education level: Not on file  Occupational History  . Not on file  Social Needs  . Financial resource strain: Not hard at all  . Food insecurity:    Worry: Never true    Inability: Never true  . Transportation needs:    Medical: No    Non-medical: No  Tobacco Use  . Smoking status: Former Smoker    Packs/day: 0.10    Years: 2.00    Pack years: 0.20    Types: Cigarettes  . Smokeless tobacco: Never Used  . Tobacco comment: 09/09/2018 "quit smoking in the late 1980s"  Substance and Sexual Activity  . Alcohol use: Not Currently    Frequency: Never    Comment: 09/09/2018 "might have 1 drink/month"  . Drug use: Never  . Sexual activity: Yes    Partners: Male  Lifestyle  . Physical activity:    Days per week: 7 days    Minutes per session: 60 min  . Stress: Not at all  Relationships  . Social connections:    Talks on phone: More than three times a week    Gets together: More than three times a week    Attends religious service: Never    Active member of club or organization: No    Attends meetings of clubs or organizations: Never    Relationship status: Married  . Intimate partner violence:    Fear of current or ex partner:  Patient refused    Emotionally abused: Patient refused    Physically abused: Patient refused    Forced sexual activity: Patient refused  Other Topics Concern  . Not on file  Social History Narrative   Marital status/children/pets: married, 3 children.    Education/employment: Some college, [employer]   Safety:      -Wears a bicycle helmet riding a bike: Yes     -  smoke alarm in the home:Yes     - wears seatbelt: Yes     - Feels safe in their relationships: Yes      Lives with husband   Caffeine use: 1 cup per day   Right handed       PHYSICAL EXAM  Vitals:   02/03/19 1040  BP: 135/89  Pulse: 60  Weight: 110 lb (49.9 kg)  Height: 5' (1.524 m)   Body mass index is 21.48 kg/m.  Generalized: Well developed, in no acute distress  Cardiology: normal rate and rhythm, no murmur noted Neurological examination  Mentation: Alert oriented to time, place, history taking. Follows all commands speech and language fluent Cranial nerve II-XII: Pupils were equal round reactive to light. Extraocular movements were full, visual field were full on confrontational test. Facial sensation and strength were normal. Uvula tongue midline. Head turning and shoulder shrug  were normal and symmetric. Motor: The motor testing reveals 5 over 5 strength of all 4 extremities. Good symmetric motor tone is noted throughout.  Sensory: Sensory testing is intact to soft touch on all 4 extremities. No evidence of extinction is noted.  Coordination: Cerebellar testing reveals good finger-nose-finger bilaterally.  Gait and station: Gait is normal.     DIAGNOSTIC DATA (LABS, IMAGING, TESTING) - I reviewed patient records, labs, notes, testing and imaging myself where available.  No flowsheet data found.   Lab Results  Component Value Date   WBC 6.8 11/18/2018   HGB 10.5 (L) 10/30/2018   HCT 31 11/18/2018   MCV 87 11/18/2018   PLT 267 11/18/2018      Component Value Date/Time   NA 140 11/18/2018    K 4.8 11/18/2018   CL 103 11/18/2018   CO2 29 11/18/2018   GLUCOSE 97 09/25/2018 0402   BUN 16 11/18/2018   CREATININE 0.86 11/18/2018   CALCIUM 10.4 11/18/2018   PROT 5.8 (L) 09/05/2018 1619   ALBUMIN 4.2 11/18/2018   AST 160 (H) 09/05/2018 1619   ALT 10 09/05/2018 1619   ALKPHOS 72 09/05/2018 1619   BILITOT 1.0 09/05/2018 1619   GFRNONAA 74 11/18/2018   GFRAA 86 11/18/2018   No results found for: CHOL, HDL, LDLCALC, LDLDIRECT, TRIG, CHOLHDL No results found for: HGBA1C No results found for: VITAMINB12 No results found for: TSH   ASSESSMENT AND PLAN 58 y.o. year old female  has a past medical history of Anemia (09/05/2018), Chronic kidney disease, Complication of anesthesia, Depression, Frequent UTI, GERD (gastroesophageal reflux disease), Hiatal hernia, History of blood transfusion (08/2018; 09/2018), Hypertension, Renal cell carcinoma ([place]) (09/2018), and Retroperitoneal hematoma. here with     ICD-10-CM   1. Genitofemoral neuralgia of left side G58.8     Mrs. Daryll Drown reports that symptoms have significantly improved with time.  She does not feel that gabapentin helped her at all, in fact, there were several side effects with this medication she could not tolerate.  She is doing well since addition.  She is able to ride her horse again and is very pleased.  She denies any concerns of falls or significant weakness.  I have advised that she follow-up with [place] [facility].  She is aware that she can call with any worsening of symptoms.   No orders of the defined types were placed in this encounter.    No orders of the defined types were placed in this encounter.    I spent 15 minutes with the patient. 50% of this time was spent counseling and educating  patient on plan of care and medications.    Debbora Presto, FNP-C 02/03/2019, 11:12 AM [facility] [address] [place], [place] [address] [phone]

## 2019-02-03 ENCOUNTER — Encounter: Payer: Self-pay | Admitting: Family Medicine

## 2019-02-03 ENCOUNTER — Ambulatory Visit: Payer: BLUE CROSS/BLUE SHIELD | Admitting: Family Medicine

## 2019-02-03 ENCOUNTER — Ambulatory Visit: Payer: BLUE CROSS/BLUE SHIELD | Admitting: Neurology

## 2019-02-03 VITALS — BP 135/89 | HR 60 | Ht 60.0 in | Wt 110.0 lb

## 2019-02-03 DIAGNOSIS — G588 Other specified mononeuropathies: Secondary | ICD-10-CM | POA: Diagnosis not present

## 2019-02-03 NOTE — Patient Instructions (Signed)
Stop gabapentin  Follow up as needed, Call with worsening symptoms

## 2019-02-04 NOTE — Progress Notes (Signed)
I have read the note, and I agree with the clinical assessment and plan.  Harvest Deist A. Daveda Larock, MD, PhD, FAAN Certified in Neurology, Clinical Neurophysiology, Sleep Medicine, Pain Medicine and Neuroimaging  [facility] [address], [address] [place], [place] [address] [phone]  

## 2019-03-31 DIAGNOSIS — C642 Malignant neoplasm of left kidney, except renal pelvis: Secondary | ICD-10-CM | POA: Diagnosis not present

## 2019-03-31 DIAGNOSIS — R911 Solitary pulmonary nodule: Secondary | ICD-10-CM | POA: Diagnosis not present

## 2019-04-05 DIAGNOSIS — C642 Malignant neoplasm of left kidney, except renal pelvis: Secondary | ICD-10-CM | POA: Diagnosis not present

## 2019-04-05 DIAGNOSIS — R911 Solitary pulmonary nodule: Secondary | ICD-10-CM | POA: Diagnosis not present

## 2019-04-07 DIAGNOSIS — C642 Malignant neoplasm of left kidney, except renal pelvis: Secondary | ICD-10-CM | POA: Diagnosis not present

## 2019-04-28 ENCOUNTER — Ambulatory Visit (INDEPENDENT_AMBULATORY_CARE_PROVIDER_SITE_OTHER): Payer: BLUE CROSS/BLUE SHIELD | Admitting: Family Medicine

## 2019-04-28 ENCOUNTER — Encounter: Payer: Self-pay | Admitting: Family Medicine

## 2019-04-28 ENCOUNTER — Encounter: Payer: BLUE CROSS/BLUE SHIELD | Admitting: Family Medicine

## 2019-04-28 ENCOUNTER — Other Ambulatory Visit: Payer: Self-pay

## 2019-04-28 VITALS — BP 110/73 | Temp 97.1°F | Ht 60.0 in | Wt 110.0 lb

## 2019-04-28 DIAGNOSIS — I700 Atherosclerosis of aorta: Secondary | ICD-10-CM

## 2019-04-28 DIAGNOSIS — C649 Malignant neoplasm of unspecified kidney, except renal pelvis: Secondary | ICD-10-CM | POA: Diagnosis not present

## 2019-04-28 DIAGNOSIS — G588 Other specified mononeuropathies: Secondary | ICD-10-CM

## 2019-04-28 DIAGNOSIS — Z23 Encounter for immunization: Secondary | ICD-10-CM

## 2019-04-28 DIAGNOSIS — Z0000 Encounter for general adult medical examination without abnormal findings: Secondary | ICD-10-CM | POA: Diagnosis not present

## 2019-04-28 DIAGNOSIS — Z131 Encounter for screening for diabetes mellitus: Secondary | ICD-10-CM | POA: Diagnosis not present

## 2019-04-28 DIAGNOSIS — D619 Aplastic anemia, unspecified: Secondary | ICD-10-CM | POA: Diagnosis not present

## 2019-04-28 DIAGNOSIS — Z1211 Encounter for screening for malignant neoplasm of colon: Secondary | ICD-10-CM | POA: Diagnosis not present

## 2019-04-28 DIAGNOSIS — Z1159 Encounter for screening for other viral diseases: Secondary | ICD-10-CM | POA: Diagnosis not present

## 2019-04-28 NOTE — Patient Instructions (Addendum)
Lab and nurse visit next week-labs and Shingrix No. 1 Nurse visit 4 weeks for Td 3 months follow-up for Pap-pelvic and  Shingrix No. 2 1 year follow-up for CPE  Health Maintenance, Female Adopting a healthy lifestyle and getting preventive care can go a long way to promote health and wellness. Talk with your health care provider about what schedule of regular examinations is right for you. This is a good chance for you to check in with your provider about disease prevention and staying healthy. In between checkups, there are plenty of things you can do on your own. Experts have done a lot of research about which lifestyle changes and preventive measures are most likely to keep you healthy. Ask your health care provider for more information. Weight and diet Eat a healthy diet  Be sure to include plenty of vegetables, fruits, low-fat dairy products, and lean protein.  Do not eat a lot of foods high in solid fats, added sugars, or salt.  Get regular exercise. This is one of the most important things you can do for your health. ? Most adults should exercise for at least 150 minutes each week. The exercise should increase your heart rate and make you sweat (moderate-intensity exercise). ? Most adults should also do strengthening exercises at least twice a week. This is in addition to the moderate-intensity exercise. Maintain a healthy weight  Body mass index (BMI) is a measurement that can be used to identify possible weight problems. It estimates body fat based on height and weight. Your health care provider can help determine your BMI and help you achieve or maintain a healthy weight.  For females 42 years of age and older: ? A BMI below 18.5 is considered underweight. ? A BMI of 18.5 to 24.9 is normal. ? A BMI of 25 to 29.9 is considered overweight. ? A BMI of 30 and above is considered obese. Watch levels of cholesterol and blood lipids  You should start having your blood tested for  lipids and cholesterol at 60 years of age, then have this test every 5 years.  You may need to have your cholesterol levels checked more often if: ? Your lipid or cholesterol levels are high. ? You are older than 60 years of age. ? You are at high risk for heart disease. Cancer screening Lung Cancer  Lung cancer screening is recommended for adults 28-106 years old who are at high risk for lung cancer because of a history of smoking.  A yearly low-dose CT scan of the lungs is recommended for people who: ? Currently smoke. ? Have quit within the past 15 years. ? Have at least a 30-pack-year history of smoking. A pack year is smoking an average of one pack of cigarettes a day for 1 year.  Yearly screening should continue until it has been 15 years since you quit.  Yearly screening should stop if you develop a health problem that would prevent you from having lung cancer treatment. Breast Cancer  Practice breast self-awareness. This means understanding how your breasts normally appear and feel.  It also means doing regular breast self-exams. Let your health care provider know about any changes, no matter how small.  If you are in your 20s or 30s, you should have a clinical breast exam (CBE) by a health care provider every 1-3 years as part of a regular health exam.  If you are 55 or older, have a CBE every year. Also consider having a breast X-ray (mammogram)  every year.  If you have a family history of breast cancer, talk to your health care provider about genetic screening.  If you are at high risk for breast cancer, talk to your health care provider about having an MRI and a mammogram every year.  Breast cancer gene (BRCA) assessment is recommended for women who have family members with BRCA-related cancers. BRCA-related cancers include: ? Breast. ? Ovarian. ? Tubal. ? Peritoneal cancers.  Results of the assessment will determine the need for genetic counseling and BRCA1 and  BRCA2 testing. Cervical Cancer Your health care provider may recommend that you be screened regularly for cancer of the pelvic organs (ovaries, uterus, and vagina). This screening involves a pelvic examination, including checking for microscopic changes to the surface of your cervix (Pap test). You may be encouraged to have this screening done every 3 years, beginning at age 50.  For women ages 82-65, health care providers may recommend pelvic exams and Pap testing every 3 years, or they may recommend the Pap and pelvic exam, combined with testing for human papilloma virus (HPV), every 5 years. Some types of HPV increase your risk of cervical cancer. Testing for HPV may also be done on women of any age with unclear Pap test results.  Other health care providers may not recommend any screening for nonpregnant women who are considered low risk for pelvic cancer and who do not have symptoms. Ask your health care provider if a screening pelvic exam is right for you.  If you have had past treatment for cervical cancer or a condition that could lead to cancer, you need Pap tests and screening for cancer for at least 20 years after your treatment. If Pap tests have been discontinued, your risk factors (such as having a new sexual partner) need to be reassessed to determine if screening should resume. Some women have medical problems that increase the chance of getting cervical cancer. In these cases, your health care provider may recommend more frequent screening and Pap tests. Colorectal Cancer  This type of cancer can be detected and often prevented.  Routine colorectal cancer screening usually begins at 60 years of age and continues through 60 years of age.  Your health care provider may recommend screening at an earlier age if you have risk factors for colon cancer.  Your health care provider may also recommend using home test kits to check for hidden blood in the stool.  A small camera at the end of  a tube can be used to examine your colon directly (sigmoidoscopy or colonoscopy). This is done to check for the earliest forms of colorectal cancer.  Routine screening usually begins at age 68.  Direct examination of the colon should be repeated every 5-10 years through 60 years of age. However, you may need to be screened more often if early forms of precancerous polyps or small growths are found. Skin Cancer  Check your skin from head to toe regularly.  Tell your health care provider about any new moles or changes in moles, especially if there is a change in a mole's shape or color.  Also tell your health care provider if you have a mole that is larger than the size of a pencil eraser.  Always use sunscreen. Apply sunscreen liberally and repeatedly throughout the day.  Protect yourself by wearing long sleeves, pants, a wide-brimmed hat, and sunglasses whenever you are outside. Heart disease, diabetes, and high blood pressure  High blood pressure causes heart disease and increases  the risk of stroke. High blood pressure is more likely to develop in: ? People who have blood pressure in the high end of the normal range (130-139/85-89 mm Hg). ? People who are overweight or obese. ? People who are African American.  If you are 69-32 years of age, have your blood pressure checked every 3-5 years. If you are 11 years of age or older, have your blood pressure checked every year. You should have your blood pressure measured twice-once when you are at a hospital or clinic, and once when you are not at a hospital or clinic. Record the average of the two measurements. To check your blood pressure when you are not at a hospital or clinic, you can use: ? An automated blood pressure machine at a pharmacy. ? A home blood pressure monitor.  If you are between 108 years and 60 years old, ask your health care provider if you should take aspirin to prevent strokes.  Have regular diabetes screenings. This  involves taking a blood sample to check your fasting blood sugar level. ? If you are at a normal weight and have a low risk for diabetes, have this test once every three years after 60 years of age. ? If you are overweight and have a high risk for diabetes, consider being tested at a younger age or more often. Preventing infection Hepatitis B  If you have a higher risk for hepatitis B, you should be screened for this virus. You are considered at high risk for hepatitis B if: ? You were born in a country where hepatitis B is common. Ask your health care provider which countries are considered high risk. ? Your parents were born in a high-risk country, and you have not been immunized against hepatitis B (hepatitis B vaccine). ? You have HIV or AIDS. ? You use needles to inject street drugs. ? You live with someone who has hepatitis B. ? You have had sex with someone who has hepatitis B. ? You get hemodialysis treatment. ? You take certain medicines for conditions, including cancer, organ transplantation, and autoimmune conditions. Hepatitis C  Blood testing is recommended for: ? Everyone born from [birth year] through 1967. ? Anyone with known risk factors for hepatitis C. Sexually transmitted infections (STIs)  You should be screened for sexually transmitted infections (STIs) including gonorrhea and chlamydia if: ? You are sexually active and are younger than 60 years of age. ? You are older than 60 years of age and your health care provider tells you that you are at risk for this type of infection. ? Your sexual activity has changed since you were last screened and you are at an increased risk for chlamydia or gonorrhea. Ask your health care provider if you are at risk.  If you do not have HIV, but are at risk, it may be recommended that you take a prescription medicine daily to prevent HIV infection. This is called pre-exposure prophylaxis (PrEP). You are considered at risk if: ? You are  sexually active and do not regularly use condoms or know the HIV status of your partner(s). ? You take drugs by injection. ? You are sexually active with a partner who has HIV. Talk with your health care provider about whether you are at high risk of being infected with HIV. If you choose to begin PrEP, you should first be tested for HIV. You should then be tested every 3 months for as long as you are taking PrEP. Pregnancy  If  you are premenopausal and you may become pregnant, ask your health care provider about preconception counseling.  If you may become pregnant, take 400 to 800 micrograms (mcg) of folic acid every day.  If you want to prevent pregnancy, talk to your health care provider about birth control (contraception). Osteoporosis and menopause  Osteoporosis is a disease in which the bones lose minerals and strength with aging. This can result in serious bone fractures. Your risk for osteoporosis can be identified using a bone density scan.  If you are 60 years of age or older, or if you are at risk for osteoporosis and fractures, ask your health care provider if you should be screened.  Ask your health care provider whether you should take a calcium or vitamin D supplement to lower your risk for osteoporosis.  Menopause may have certain physical symptoms and risks.  Hormone replacement therapy may reduce some of these symptoms and risks. Talk to your health care provider about whether hormone replacement therapy is right for you. Follow these instructions at home:  Schedule regular health, dental, and eye exams.  Stay current with your immunizations.  Do not use any tobacco products including cigarettes, chewing tobacco, or electronic cigarettes.  If you are pregnant, do not drink alcohol.  If you are breastfeeding, limit how much and how often you drink alcohol.  Limit alcohol intake to no more than 1 drink per day for nonpregnant women. One drink equals 12 ounces of  beer, 5 ounces of wine, or 1 ounces of hard liquor.  Do not use street drugs.  Do not share needles.  Ask your health care provider for help if you need support or information about quitting drugs.  Tell your health care provider if you often feel depressed.  Tell your health care provider if you have ever been abused or do not feel safe at home. This information is not intended to replace advice given to you by your health care provider. Make sure you discuss any questions you have with your health care provider. Document Released: 06/10/2011 Document Revised: 05/02/2016 Document Reviewed: 08/29/2015 Elsevier Interactive Patient Education  2019 [employer].

## 2019-04-28 NOTE — Progress Notes (Signed)
VIRTUAL VISIT VIA VIDEO  I connected with Sharon Henson on 04/28/19 at  9:00 AM EDT by a video enabled telemedicine application and verified that I am speaking with the correct person using two identifiers. Location patient: Home Location provider: [facility], Office Persons participating in the virtual visit: Patient, Dr. Raoul Pitch and R.Baker, LPN  I discussed the limitations of evaluation and management by telemedicine and the availability of in person appointments. The patient expressed understanding and agreed to proceed.  Patient Care Team    Relationship Specialty Notifications Start End  Ma Hillock, DO PCP - General Family Medicine  10/30/18   Justin Mend, MD Attending Physician Nephrology  11/02/18   Lucas Mallow, MD Consulting Physician Urology  11/02/18      SUBJECTIVE Chief Complaint  Patient presents with  . Discuss CT results    Pt had done on April 27th @ [facility]. No changes since last CT scan. She would like to discuss where to go from here.     HPI: Sharon Henson is a 58 y.o. female present for preventive visit and follow up on chronic conditions.  Renal cell carcinoma, unspecified laterality (HCC)/Aplastic anemia (HCC) Under the care of urology and nephrology for this condition.  Recent CT through urology was reported as stable.  Genitofemoral neuralgia of left side Following with neurology for her condition.  Occurred status post surgery for her renal cell carcinoma.  Aortic atherosclerosis ([place]) Neck atherosclerosis discovered on CT examination last fall when she was undergoing surgery for renal cell carcinoma.  No prior lipid collection available to review.  Health maintenance:  Colonoscopy: No family history of colon cancer.  No prior colonoscopy or screening completed.  She is agreeable to Cologuard. Mammogram: Last mammogram 07/09/2018, patient would rather have mammogram every 2 years.  No family history. Cervical cancer  screening: Last pap approximately 12/2016.  Patient reports it was normal.  She has her Pap is completed by PCP. Immunizations: Tetanus vaccination due, influenza up-to-date 2019, patient agreeable to start Shingrix series. Infectious disease screening: HIV completed, patient agreeable to hep C screening.  ROS: See pertinent positives and negatives per HPI.  Patient Active Problem List   Diagnosis Date Noted  . Aplastic anemia ([place]) 11/26/2018  . Pulmonary hypertension, primary ([place]) 11/26/2018  . Renal cell carcinoma ([place]) 11/26/2018  . Genitofemoral neuralgia of left side 11/04/2018  . Leg weakness 11/04/2018    Social History   Tobacco Use  . Smoking status: Former Smoker    Packs/day: 0.10    Years: 2.00    Pack years: 0.20    Types: Cigarettes  . Smokeless tobacco: Never Used  . Tobacco comment: 09/09/2018 "quit smoking in the late 1980s"  Substance Use Topics  . Alcohol use: Not Currently    Frequency: Never    Comment: 09/09/2018 "might have 1 drink/month"    Current Outpatient Medications:  .  cetirizine (ZYRTEC) 10 MG tablet, Take 10 mg by mouth daily., Disp: , Rfl:  .  Multiple Vitamins-Minerals (MULTIVITAMIN ADULT PO), Take 1 tablet by mouth daily., Disp: , Rfl:   No Known Allergies  OBJECTIVE: BP 110/73   Temp (!) 97.1 F (36.2 C) (Oral)   Ht 5' (1.524 m)   Wt 110 lb (49.9 kg)   BMI 21.48 kg/m  Gen: No acute distress. Nontoxic in appearance. Pleasant caucasian female.  HENT: AT. Show Low.  MMM.  Eyes:Pupils Equal Round Reactive to light, Extraocular movements intact,  Conjunctiva without  redness, discharge or icterus. CV: no edema Chest: Cough or shortness of breath not present Skin: no rashes, purpura or petechiae.  Neuro: Normal gait. Alert. Oriented x3  Psych: Normal affect, dress and demeanor. Normal speech. Normal thought content and judgment.  ASSESSMENT AND PLAN: [name] is a 58 y.o. female present for  Encounter for preventive health  examination Patient was encouraged to exercise greater than 150 minutes a week. Patient was encouraged to choose a diet filled with fresh fruits and vegetables, and lean meats. AVS provided to patient today for education/recommendation on gender specific health and safety maintenance. Colonoscopy:  She is agreeable to Cologuard. Mammogram:patient would rather have mammogram every 2 years (2021) Cervical cancer screening: PAP schedule in 3 mos (w/ 2nd shingrix) Immunizations: shingrix #1 at lab collection/nurse visit this week. Td by nurse visit in 4 weeks.  Infectious disease screening: patient agreeable to hep C screening. Renal cell carcinoma, unspecified laterality (HCC)/Aplastic anemia (HCC) - stable by uro. Continue routine followups with uro and yearly with nephrology. CT repeat was suggested every 3-6 mos.  - CBC w/Diff; Future - Comp Met (CMET); Future - TSH; Future Genitofemoral neuralgia of left side - continue f/u with neuro Colon cancer screening - Cologuard; Future Need for vaccination shingrix #1 at lab collection/nurse visit this week. Td by nurse visit in 4 weeks. Shingrix #2 at PAP appt in 3 mos.  Need for hepatitis C screening test - Hepatitis C Antibody; Future Aortic atherosclerosis ([place]) Discussed diagnosis with her today and the likelihood of starting a statin medication to help with CV protection.  Will await lab results to discuss further. - diet and exercise modifications discussed.  - Lipid panel; Future - TSH; Future Diabetes mellitus screening - Hemoglobin A1c; Future  CPE and > 15 min (50 % face to face) spent covering chronic conditions.   Lab and nurse visit next week-labs and Shingrix No. 1 Nurse visit 4 weeks for Td 3 months follow-up for Pap-pelvic and  Shingrix No. 2 1 year follow-up for [place], DO 04/28/2019

## 2019-04-29 ENCOUNTER — Encounter: Payer: Self-pay | Admitting: Family Medicine

## 2019-05-10 DIAGNOSIS — Z1211 Encounter for screening for malignant neoplasm of colon: Secondary | ICD-10-CM | POA: Diagnosis not present

## 2019-05-10 IMAGING — CT CT RENAL STONE PROTOCOL
2 of 4 series · 16 of 46 positions shown, 18 images · non-contrast
Comparison: None.

CLINICAL DATA: Fever and nausea since [REDACTED].

EXAM:
CT ABDOMEN AND PELVIS WITHOUT CONTRAST
TECHNIQUE: Multidetector CT imaging of the abdomen and pelvis was performed
following the standard protocol without IV contrast.

[Series 3: stone study 5.0 i30f 2 · axial · 0.76mm/px · z∈[+829,+1239]mm · 13 of 93 slices shown, 15 images]
[im 7/93  soft-tissue]
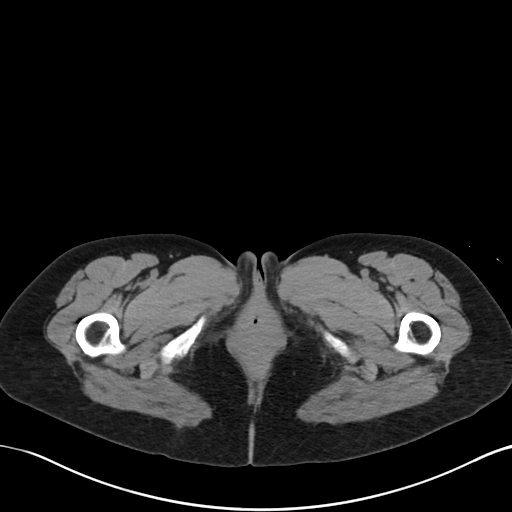
[im 7/93  bone]
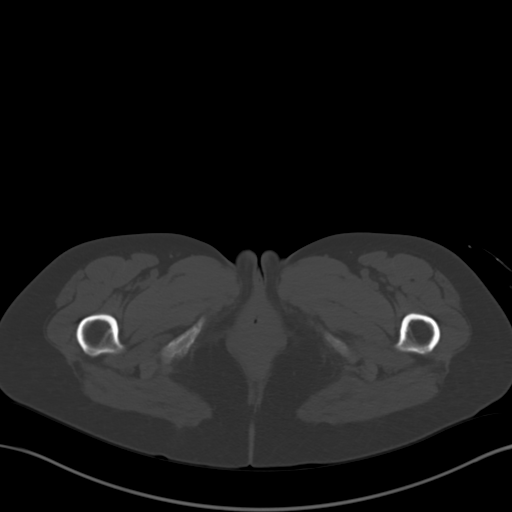
[im 14/93  soft-tissue]
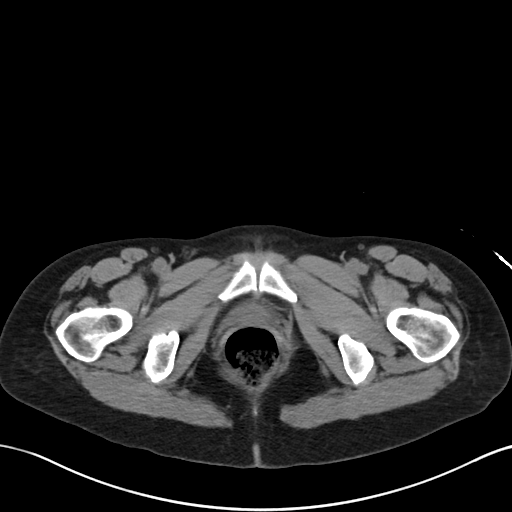
[im 21/93  soft-tissue]
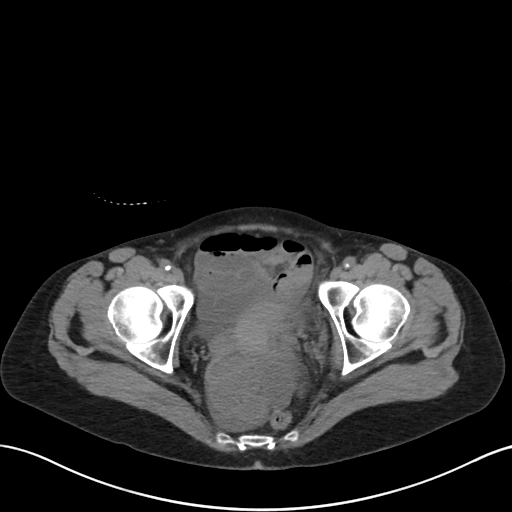
[im 28/93  soft-tissue]
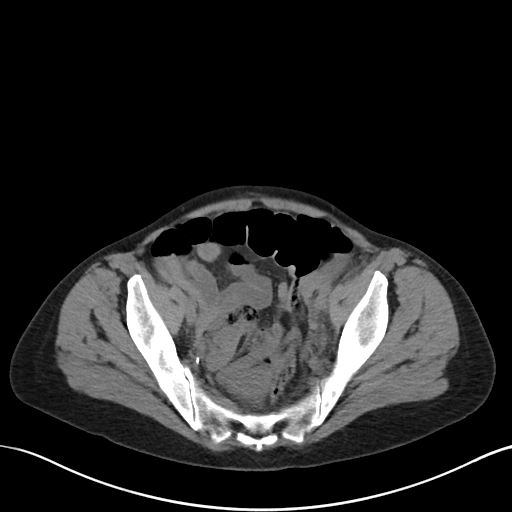
[im 35/93  soft-tissue]
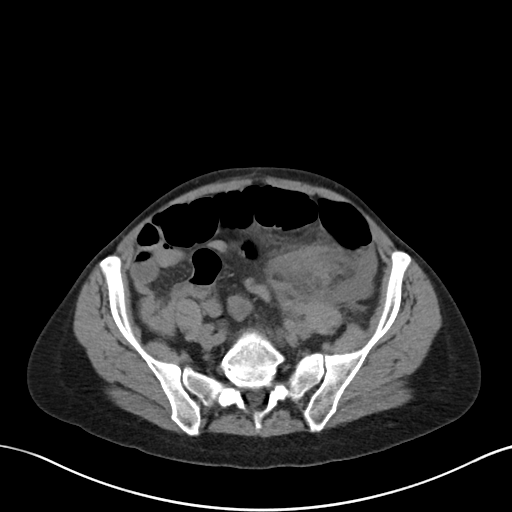
[im 41/93  soft-tissue]
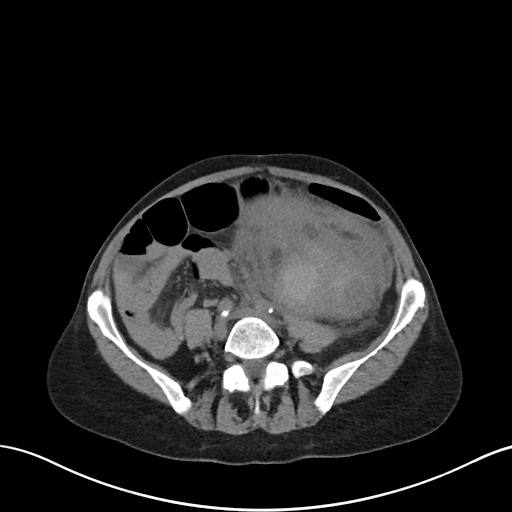
[im 48/93  soft-tissue]
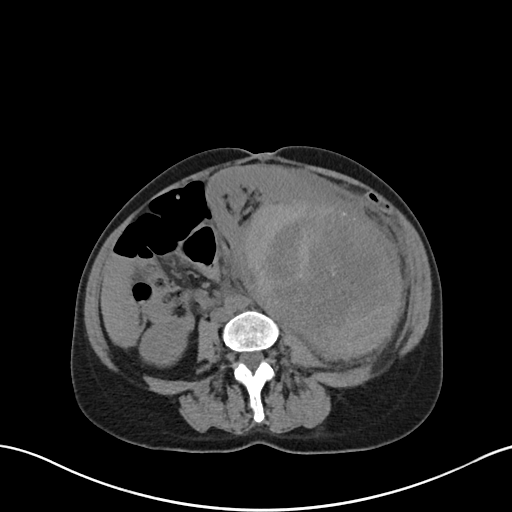
[im 55/93  soft-tissue]
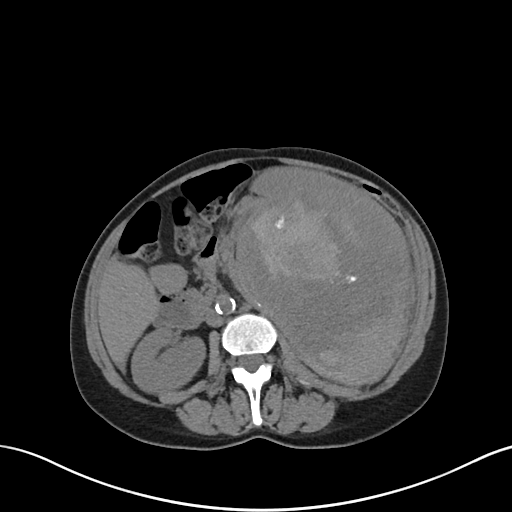
[im 62/93  soft-tissue]
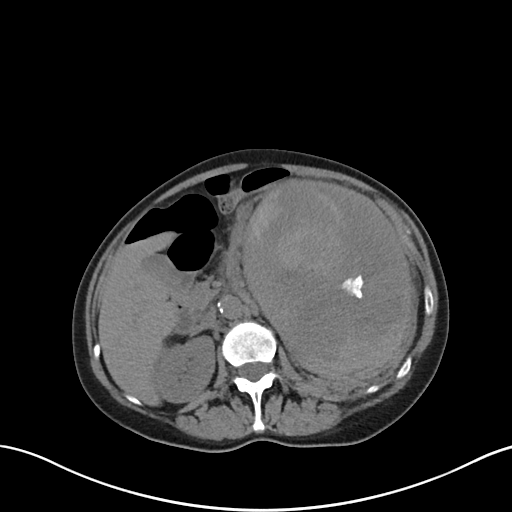
[im 62/93  bone]
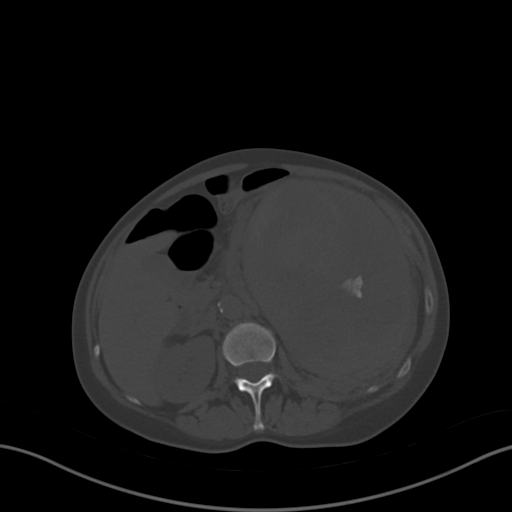
[im 69/93  soft-tissue]
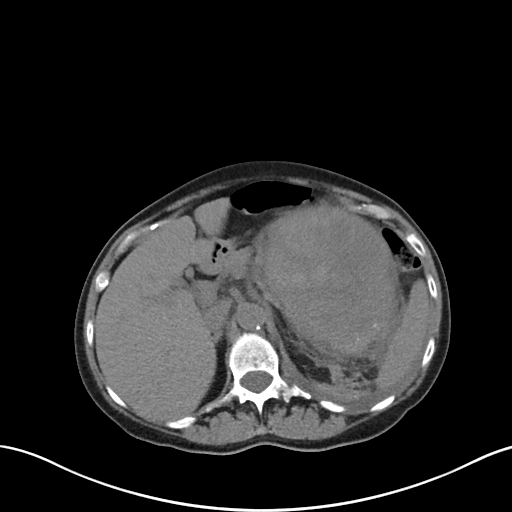
[im 75/93  soft-tissue]
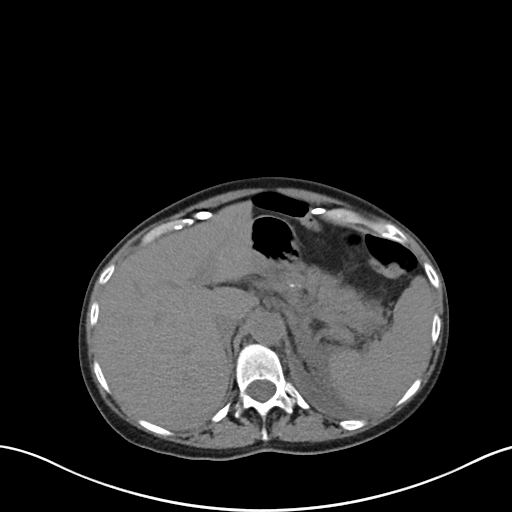
[im 82/93  soft-tissue]
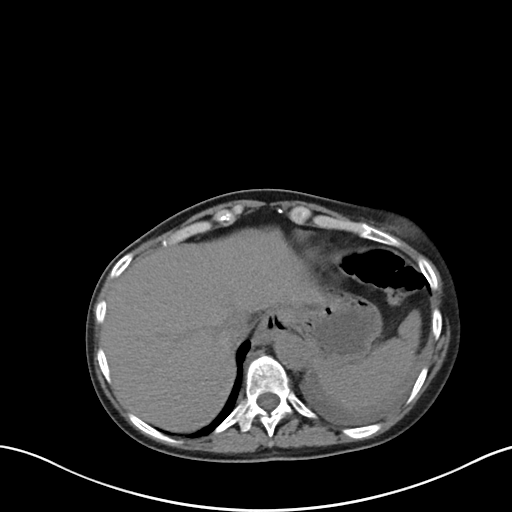
[im 89/93  soft-tissue]
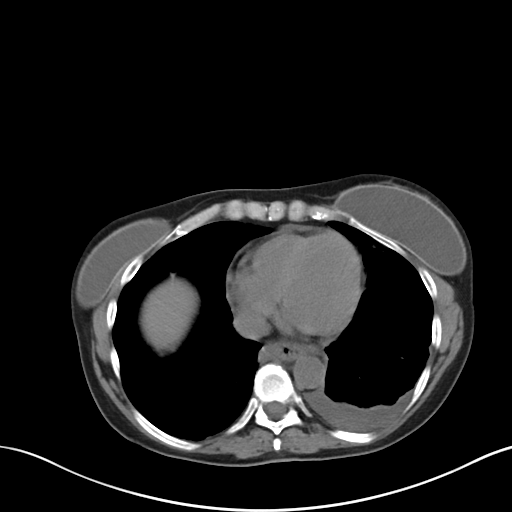

[Series 6: coronal soft tissue · coronal · 0.80mm/px · 3 of 101 slices shown]
[im 34/101  soft-tissue]
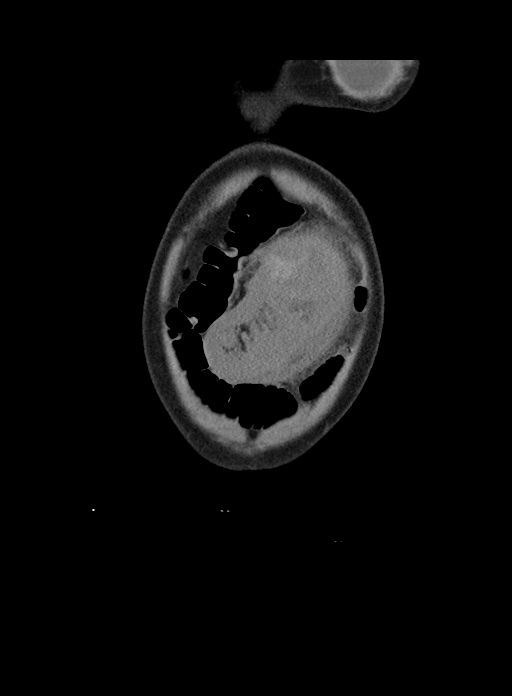
[im 45/101  soft-tissue]
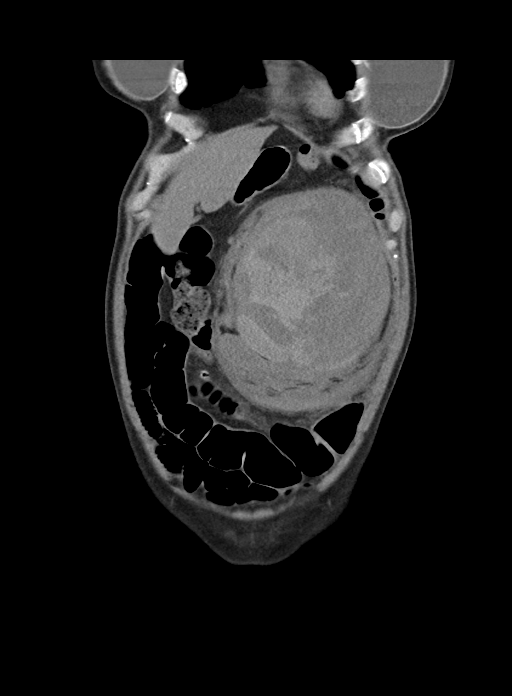
[im 56/101  soft-tissue]
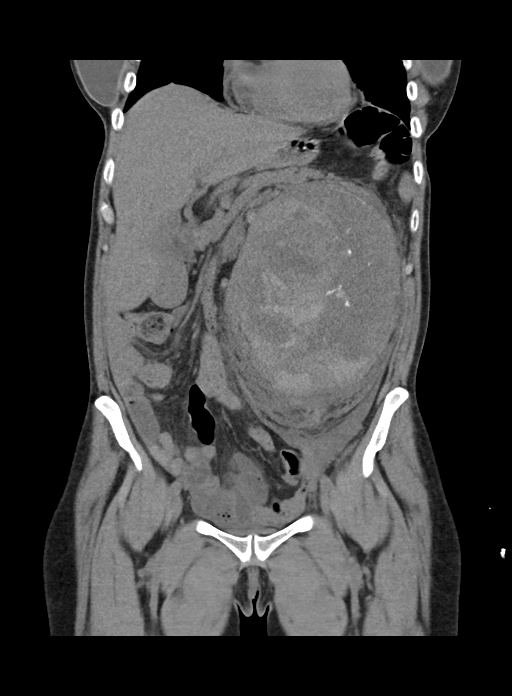

[16 of 46 positions shown; findings below may reference images not displayed]

FINDINGS: Lower chest: Small left pleural effusion with mild compressive
atelectasis in the left lower lobe. Heart size normal. No
pericardial effusion.

Hepatobiliary: Liver and gallbladder are unremarkable. No biliary
ductal dilatation.

Pancreas: Negative.

Spleen: Negative.

Adrenals/Urinary Tract: Adrenal glands and right kidney are
unremarkable. There is a very large heterogeneous mass with internal
hemorrhage arising from the left kidney, measuring 12.4 x 15.6 cm.
Perianal stranding and fluid on the left. Hemorrhage is seen in the
left renal pelvis. There may be mild left hydronephrosis. Ureters
are decompressed. Bladder is very low in volume.

Stomach/Bowel: Small hiatal hernia. Stomach, small bowel and colon
are unremarkable. Favor fluid-filled small bowel in the dependent
anatomic pelvis over free fluid.

Vascular/Lymphatic: Atherosclerotic calcification of the arterial
vasculature without abdominal aortic aneurysm. No definite
pathologically enlarged lymph nodes.

Reproductive: Uterus is visualized.  No adnexal mass.

Other: Small ascites.

Musculoskeletal: No worrisome lytic or sclerotic lesions.
IMPRESSION: 1. Very large hemorrhagic mass arising from the left kidney, highly
worrisome for renal cell carcinoma. These results were called by
telephone at the time of interpretation on 09/05/2018 at [DATE] to
Dr. SHOWAT MOKBUL , who verbally acknowledged these results.
2. High attenuation within the left renal pelvis is indicative of
hemorrhage/clot, with probable mild associated left hydronephrosis.
3. Small ascites.
4. Small left pleural effusion.
5.  Aortic atherosclerosis ([id number]-170.0).

## 2019-05-12 ENCOUNTER — Other Ambulatory Visit: Payer: Self-pay

## 2019-05-12 ENCOUNTER — Ambulatory Visit (INDEPENDENT_AMBULATORY_CARE_PROVIDER_SITE_OTHER): Payer: BC Managed Care – PPO | Admitting: Family Medicine

## 2019-05-12 DIAGNOSIS — C649 Malignant neoplasm of unspecified kidney, except renal pelvis: Secondary | ICD-10-CM | POA: Diagnosis not present

## 2019-05-12 DIAGNOSIS — I700 Atherosclerosis of aorta: Secondary | ICD-10-CM | POA: Diagnosis not present

## 2019-05-12 DIAGNOSIS — D619 Aplastic anemia, unspecified: Secondary | ICD-10-CM

## 2019-05-12 DIAGNOSIS — Z131 Encounter for screening for diabetes mellitus: Secondary | ICD-10-CM | POA: Diagnosis not present

## 2019-05-12 DIAGNOSIS — Z1159 Encounter for screening for other viral diseases: Secondary | ICD-10-CM | POA: Diagnosis not present

## 2019-05-12 DIAGNOSIS — Z23 Encounter for immunization: Secondary | ICD-10-CM | POA: Diagnosis not present

## 2019-05-12 LAB — COMPREHENSIVE METABOLIC PANEL
ALT: 10 U/L (ref 0–35)
AST: 16 U/L (ref 0–37)
Albumin: 4.3 g/dL (ref 3.5–5.2)
Alkaline Phosphatase: 82 U/L (ref 39–117)
BUN: 21 mg/dL (ref 6–23)
CO2: 32 mEq/L (ref 19–32)
Calcium: 9.8 mg/dL (ref 8.4–10.5)
Chloride: 101 mEq/L (ref 96–112)
Creatinine, Ser: 0.96 mg/dL (ref 0.40–1.20)
GFR: 59.21 mL/min — ABNORMAL LOW (ref >60.00)
Glucose, Bld: 100 mg/dL — ABNORMAL HIGH (ref 70–99)
Potassium: 5 mEq/L (ref 3.5–5.1)
Sodium: 138 mEq/L (ref 135–145)
Total Bilirubin: 0.4 mg/dL (ref 0.2–1.2)
Total Protein: 7.3 g/dL (ref 6.0–8.3)

## 2019-05-12 LAB — LIPID PANEL
Cholesterol: 214 mg/dL — ABNORMAL HIGH (ref 0–200)
HDL: 48.6 mg/dL (ref >39.00)
LDL Cholesterol: 137 mg/dL — ABNORMAL HIGH (ref 0–99)
NonHDL: 165.23
Total CHOL/HDL Ratio: 4
Triglycerides: 142 mg/dL (ref 0.0–149.0)
VLDL: 28.4 mg/dL (ref 0.0–40.0)

## 2019-05-12 LAB — CBC WITH DIFFERENTIAL/PLATELET
Basophils Absolute: 0.1 10*3/uL (ref 0.0–0.1)
Basophils Relative: 1 % (ref 0.0–3.0)
Eosinophils Absolute: 0.3 10*3/uL (ref 0.0–0.7)
Eosinophils Relative: 5.9 % — ABNORMAL HIGH (ref 0.0–5.0)
HCT: 34.7 % — ABNORMAL LOW (ref 36.0–46.0)
Hemoglobin: 11.5 g/dL — ABNORMAL LOW (ref 12.0–15.0)
Lymphocytes Relative: 16.6 % (ref 12.0–46.0)
Lymphs Abs: 0.8 10*3/uL (ref 0.7–4.0)
MCHC: 33 g/dL (ref 30.0–36.0)
MCV: 87.1 fl (ref 78.0–100.0)
Monocytes Absolute: 0.5 10*3/uL (ref 0.1–1.0)
Monocytes Relative: 9.3 % (ref 3.0–12.0)
Neutro Abs: 3.4 10*3/uL (ref 1.4–7.7)
Neutrophils Relative %: 67.2 % (ref 43.0–77.0)
Platelets: 193 10*3/uL (ref 150.0–400.0)
RBC: 3.98 Mil/uL (ref 3.87–5.11)
RDW: 14.3 % (ref 11.5–15.5)
WBC: 5.1 10*3/uL (ref 4.0–10.5)

## 2019-05-12 LAB — HEMOGLOBIN A1C: Hgb A1c MFr Bld: 5.7 % (ref 4.6–6.5)

## 2019-05-12 LAB — TSH: TSH: 3.52 u[IU]/mL (ref 0.35–4.50)

## 2019-05-13 ENCOUNTER — Telehealth: Payer: Self-pay | Admitting: Family Medicine

## 2019-05-13 DIAGNOSIS — Z905 Acquired absence of kidney: Secondary | ICD-10-CM

## 2019-05-13 DIAGNOSIS — I2510 Atherosclerotic heart disease of native coronary artery without angina pectoris: Secondary | ICD-10-CM

## 2019-05-13 DIAGNOSIS — E785 Hyperlipidemia, unspecified: Secondary | ICD-10-CM

## 2019-05-13 DIAGNOSIS — I700 Atherosclerosis of aorta: Secondary | ICD-10-CM | POA: Insufficient documentation

## 2019-05-13 LAB — HEPATITIS C ANTIBODY
Hepatitis C Ab: NONREACTIVE
SIGNAL TO CUT-OFF: 0.02 (ref <1.00)

## 2019-05-13 MED ORDER — ATORVASTATIN CALCIUM 10 MG PO TABS: 10.0000 mg | ORAL_TABLET | Freq: Every day | ORAL | 3 refills | Status: DC

## 2019-05-13 NOTE — Telephone Encounter (Signed)
Pt was called and given information. She verbalized understanding

## 2019-05-13 NOTE — Telephone Encounter (Signed)
Please inform patient the following information: Her cholesterol overall is borderline- however with her having a solitary kidney and evidence of some plaque/cholesterol build up in her aorta she should start a low dose statin for cardiovascular protection. I have called this in for her to start (before bed)- we will recheck levels at her appt in a few months for her PAP Her other  labs are stable form prior collections.

## 2019-05-14 ENCOUNTER — Ambulatory Visit: Payer: Self-pay

## 2019-05-14 NOTE — Telephone Encounter (Signed)
Returned call to patient who states that she had a shingle vaccine on Wednesday.  Yesterday while outside she noticed some redness just below the band aide.  She states it is mildly itchy.  She has no fever or other symptoms.  The redness has not changed from what she observed yesterday.  Pt will monitor and if she develops fever or redness spreads she will call back or seek medical evaluation. . Home care advice read to patient. Pt verbalized understanding of all instructions.  Reason for Disposition . Mild localized rash  Answer Assessment - Initial Assessment Questions 1. APPEARANCE of RASH: "Describe the rash."      redness 2. LOCATION: "Where is the rash located?"     Left arm below injestion site 3. NUMBER: "How many spots are there?"      None 4. SIZE: "How big are the spots?" (Inches, centimeters or compare to size of a coin)      Small area 5. ONSET: "When did the rash start?"      Yesterday  Shingles done on Wednesday 6. ITCHING: "Does the rash itch?" If so, ask: "How bad is the itch?"  (Scale 1-10; or mild, moderate, severe)     1 7. PAIN: "Does the rash hurt?" If so, ask: "How bad is the pain?"  (Scale 1-10; or mild, moderate, severe)     no 8. OTHER SYMPTOMS: "Do you have any other symptoms?" (e.g., fever)     No 9. PREGNANCY: "Is there any chance you are pregnant?" "When was your last menstrual period?"     No  Protocols used: RASH OR REDNESS - LOCALIZED-A-AH

## 2019-05-15 LAB — COLOGUARD: Cologuard: NEGATIVE

## 2019-05-17 ENCOUNTER — Other Ambulatory Visit: Payer: Self-pay

## 2019-05-17 DIAGNOSIS — Z1211 Encounter for screening for malignant neoplasm of colon: Secondary | ICD-10-CM

## 2019-06-09 ENCOUNTER — Ambulatory Visit (INDEPENDENT_AMBULATORY_CARE_PROVIDER_SITE_OTHER): Payer: BC Managed Care – PPO | Admitting: Family Medicine

## 2019-06-09 ENCOUNTER — Other Ambulatory Visit: Payer: Self-pay

## 2019-06-09 DIAGNOSIS — Z23 Encounter for immunization: Secondary | ICD-10-CM

## 2019-06-09 NOTE — Progress Notes (Signed)
Patient presented to office today for TD vaccine.  Okay per provider in office visit notes dated 04/28/2019.   Patient tolerated injection well.

## 2019-09-23 DIAGNOSIS — C642 Malignant neoplasm of left kidney, except renal pelvis: Secondary | ICD-10-CM | POA: Diagnosis not present

## 2019-09-27 ENCOUNTER — Other Ambulatory Visit (HOSPITAL_COMMUNITY)
Admission: RE | Admit: 2019-09-27 | Discharge: 2019-09-27 | Disposition: A | Discharge status: Home / Self Care | Payer: BC Managed Care – PPO | Source: Ambulatory Visit | Attending: Family Medicine | Admitting: Family Medicine

## 2019-09-27 ENCOUNTER — Encounter: Payer: Self-pay | Admitting: Family Medicine

## 2019-09-27 ENCOUNTER — Other Ambulatory Visit: Payer: Self-pay

## 2019-09-27 ENCOUNTER — Ambulatory Visit (INDEPENDENT_AMBULATORY_CARE_PROVIDER_SITE_OTHER): Payer: BC Managed Care – PPO | Admitting: Family Medicine

## 2019-09-27 VITALS — BP 120/76 | HR 58 | Temp 98.0°F | Resp 16 | Ht 61.75 in | Wt 116.5 lb

## 2019-09-27 DIAGNOSIS — E785 Hyperlipidemia, unspecified: Secondary | ICD-10-CM | POA: Diagnosis not present

## 2019-09-27 DIAGNOSIS — Z124 Encounter for screening for malignant neoplasm of cervix: Secondary | ICD-10-CM | POA: Diagnosis not present

## 2019-09-27 DIAGNOSIS — R918 Other nonspecific abnormal finding of lung field: Secondary | ICD-10-CM | POA: Diagnosis not present

## 2019-09-27 DIAGNOSIS — Z79899 Other long term (current) drug therapy: Secondary | ICD-10-CM | POA: Diagnosis not present

## 2019-09-27 DIAGNOSIS — C649 Malignant neoplasm of unspecified kidney, except renal pelvis: Secondary | ICD-10-CM | POA: Diagnosis not present

## 2019-09-27 DIAGNOSIS — C642 Malignant neoplasm of left kidney, except renal pelvis: Secondary | ICD-10-CM | POA: Diagnosis not present

## 2019-09-27 DIAGNOSIS — I700 Atherosclerosis of aorta: Secondary | ICD-10-CM

## 2019-09-27 DIAGNOSIS — Z23 Encounter for immunization: Secondary | ICD-10-CM | POA: Diagnosis not present

## 2019-09-27 LAB — HEPATIC FUNCTION PANEL
ALT: 11 U/L (ref 0–35)
AST: 16 U/L (ref 0–37)
Albumin: 4.7 g/dL (ref 3.5–5.2)
Alkaline Phosphatase: 84 U/L (ref 39–117)
Bilirubin, Direct: 0.1 mg/dL (ref 0.0–0.3)
Total Bilirubin: 0.4 mg/dL (ref 0.2–1.2)
Total Protein: 7.4 g/dL (ref 6.0–8.3)

## 2019-09-27 LAB — LIPID PANEL
Cholesterol: 162 mg/dL (ref 0–200)
HDL: 58.4 mg/dL (ref >39.00)
LDL Cholesterol: 88 mg/dL (ref 0–99)
NonHDL: 103.36
Total CHOL/HDL Ratio: 3
Triglycerides: 75 mg/dL (ref 0.0–149.0)
VLDL: 15 mg/dL (ref 0.0–40.0)

## 2019-09-27 NOTE — Progress Notes (Signed)
Patient ID: Sharon Henson, female  DOB: [birth year], 58 y.o.   MRN: [id number] Patient Care Team    Relationship Specialty Notifications Start End  Ma Hillock, DO PCP - General Family Medicine  10/30/18   Justin Mend, MD Attending Physician Nephrology  11/02/18   Lucas Mallow, MD Consulting Physician Urology  11/02/18     Chief Complaint  Patient presents with  . Gynecologic Exam    Pt is here for pap smear today.     Subjective:  Sharon Henson is a 58 y.o.  female present for PAP and follow up un [facility]  Anemia, unspecified type/Renal cell carcinoma of left kidney (HCC)/Solitary kidney, acquired diagnosed with rather large left renal mass, found to be RCC. Underwent nephrectomy with significant hemorrhage/retroperitonel bleed 09/2018. Dr. Evon Slack lis her urologist. She is in need of referral to nephrologist- she saw Dr. Johnney Ou during her hospitalization. She is doing ok since her surgery. She has a supportive family and husband. She has a positive depression screen s/p surgery, but feels it is transitional and expected for the process. She had her renal CT this morning.   HLD/Statin use Pt reports compliance  with start of low dose liptior 10 mg QD. Marland Kitchen  BMP: 09/25/2018 GFR 56, Cr 1.2--> solitary right kidney 2/2 left RCC/nephrectomy Labs UTD 04/2019  Cervical cancer screen: Menopausal. Last PAP 3-4 years ago. No abnormal PAP history.   Depression screen [facility] 2/9 09/27/2019 04/28/2019 10/30/2018  Decreased Interest 0 0 3  Down, Depressed, Hopeless 0 0 3  PHQ - 2 Score 0 0 6  Altered sleeping - - 3  Tired, decreased energy - - 3  Change in appetite - - 3  Feeling bad or failure about yourself  - - 0  Trouble concentrating - - 0  Moving slowly or fidgety/restless - - 0  Suicidal thoughts - - 0  PHQ-9 Score - - 15  Difficult doing work/chores - - Somewhat difficult   No flowsheet data found.      Fall Risk  04/28/2019  Falls in the past year? 0  Follow up  Falls evaluation completed     Immunization History  Administered Date(s) Administered  . Influenza Inj Mdck Quad Pf 10/27/2017  . Influenza,inj,Quad PF,6+ Mos 09/28/2018, 09/13/2019  . Pneumococcal Polysaccharide-23 04/03/1999  . Td 06/09/2019  . Tdap 03/25/2008  . Zoster Recombinat (Shingrix) 05/12/2019, 09/27/2019    No exam data present  Past Medical History:  Diagnosis Date  . Anemia 09/05/2018  . Chronic kidney disease   . Complication of anesthesia    "I don't get knocked out w/sodium pentothal" (09/09/2018)  . Depression   . Depression, recurrent ([place]) 11/26/2018  . Frequent UTI   . GERD (gastroesophageal reflux disease)   . Hiatal hernia   . History of blood transfusion 08/2018; 09/2018   /notes10/01/2018  . Hypertension   . Renal cell carcinoma ([place]) 09/2018   kidney  . Retroperitoneal hematoma    /notes10/01/2018   No Known Allergies Past Surgical History:  Procedure Laterality Date  . AUGMENTATION MAMMAPLASTY Bilateral 2006  . [place]; 1993  . DILATION AND CURETTAGE OF UTERUS  1979   "to remove blood clot in my uterus"  . IR ANGIO/SPINAL LEFT  09/14/2018  . IR ANGIO/SPINAL LEFT  09/14/2018  . IR ANGIOGRAM VISCERAL SELECTIVE  09/14/2018  . IR ANGIOGRAM VISCERAL SELECTIVE  09/14/2018  . IR FLUORO GUIDE CV LINE RIGHT  09/10/2018  .  IR RENAL SELECTIVE  UNI INC S&I MOD SED  09/14/2018  . IR US GUIDE VASC ACCESS RIGHT  09/10/2018  . IR US GUIDE VASC ACCESS RIGHT  09/14/2018  . NEPHRECTOMY Left 09/22/2018   Procedure: NEPHRECTOMY;  Surgeon: Lucas Mallow, MD;  Location: [facility];  Service: Urology;  Laterality: Left;  3 HRS  . UMBILICAL HERNIA REPAIR  04/08/2007   Family History  Problem Relation Age of Onset  . Heart disease Neg Hx   . Colon cancer Neg Hx   . Breast cancer Neg Hx    Social History   Socioeconomic History  . Marital status: Married    Spouse name: Not on file  . Number of children: 3  . Years of education: Not on file  .  Highest education level: Not on file  Occupational History  . Not on file  Social Needs  . Financial resource strain: Not hard at all  . Food insecurity    Worry: Never true    Inability: Never true  . Transportation needs    Medical: No    Non-medical: No  Tobacco Use  . Smoking status: Former Smoker    Packs/day: 0.10    Years: 2.00    Pack years: 0.20    Types: Cigarettes  . Smokeless tobacco: Never Used  . Tobacco comment: 09/09/2018 "quit smoking in the late 1980s"  Substance and Sexual Activity  . Alcohol use: Not Currently    Frequency: Never    Comment: 09/09/2018 "might have 1 drink/month"  . Drug use: Never  . Sexual activity: Yes    Partners: Male  Lifestyle  . Physical activity    Days per week: 7 days    Minutes per session: 60 min  . Stress: Not at all  Relationships  . Social connections    Talks on phone: More than three times a week    Gets together: More than three times a week    Attends religious service: Never    Active member of club or organization: No    Attends meetings of clubs or organizations: Never    Relationship status: Married  . Intimate partner violence    Fear of current or ex partner: Patient refused    Emotionally abused: Patient refused    Physically abused: Patient refused    Forced sexual activity: Patient refused  Other Topics Concern  . Not on file  Social History Narrative   Marital status/children/pets: married, 3 children.    Education/employment: Some college, [employer]   Safety:      -Wears a bicycle helmet riding a bike: Yes     -smoke alarm in the home:Yes     - wears seatbelt: Yes     - Feels safe in their relationships: Yes      Lives with husband   Caffeine use: 1 cup per day   Right handed    Allergies as of 09/27/2019   No Known Allergies     Medication List       Accurate as of September 27, 2019  1:42 PM. If you have any questions, ask your nurse or doctor.        atorvastatin 10 MG tablet  Commonly known as: LIPITOR Take 1 tablet (10 mg total) by mouth daily.   cetirizine 10 MG tablet Commonly known as: ZYRTEC Take 10 mg by mouth daily.   MULTIVITAMIN ADULT PO Take 1 tablet by mouth daily.      All  past medical history, surgical history, allergies, family history, immunizations andmedications were updated in the EMR today and reviewed under the history and medication portions of their EMR.    No results found for this or any previous visit (from the past 2160 hour(s)).   ROS: 14 pt review of systems performed and negative (unless mentioned in an HPI)  Objective: BP 120/76 (BP Location: Left Arm, Patient Position: Sitting, Cuff Size: Normal)   Pulse (!) 58   Temp 98 F (36.7 C) (Temporal)   Resp 16   Ht 5' 1.75" (1.568 m)   Wt 116 lb 8 oz (52.8 kg)   SpO2 96%   BMI 21.48 kg/m  Gen: Afebrile. No acute distress.  HENT: AT. [place].  Eyes:Pupils Equal Round Reactive to light, Extraocular movements intact,  Conjunctiva without redness, discharge or icterus. CV: RRR Chest: CTAB, no wheeze or crackles Neuro:  Normal gait. PERLA. EOMi. Alert. Oriented x3  Breasts: breasts appear normal, symmetrical, no tenderness on exam, no suspicious masses, no skin or nipple changes or axillary nodes. GYN:  External genitalia within normal limits, normal hair distribution, no lesions. Urethral meatus normal, no lesions. Vaginal mucosa pink, moist, normal rugae, no lesions. No cystocele or rectocele. cervix without lesions, no discharge. Bimanual exam revealed normal uterus.  No bladder/suprapubic fullness, masses or tenderness. No cervical motion tenderness. No adnexal fullness. Anus and perineum within normal limits, no lesions.   Assessment/plan: Sharon Henson is a 58 y.o. female present for EST Anemia, unspecified type/Renal cell carcinoma of left kidney (HCC)/Solitary kidney, acquired - followed by urology.   HLD- statin use/ Aortic atherosclerosis (HCC) - stable. - lipid/lft  collected today for statin use.  - F/U 6 mos.   Encounter for Papanicolaou smear for cervical cancer screening - Cytology - PAP w/ HPV. Pt will be called with results  Shingrix #2 provided today.   Follow up yearly for CPE ( usually in May/June)  Orders Placed This Encounter  Procedures  . Varicella-zoster vaccine IM  . Lipid panel  . Hepatic function panel     Note is dictated utilizing voice recognition software. Although note has been proof read prior to signing, occasional typographical errors still can be missed. If any questions arise, please do not hesitate to call for verification.  Electronically signed by: Howard Pouch, DO [place]

## 2019-10-05 ENCOUNTER — Telehealth: Payer: Self-pay

## 2019-10-05 LAB — CYTOLOGY - PAP
Comment: NEGATIVE
Diagnosis: NEGATIVE
High risk HPV: NEGATIVE

## 2019-10-05 NOTE — Telephone Encounter (Signed)
Received pathology report for HPV on 10/05/2019. Pap results are not back yet. Placed on providers desk on 10/05/2019.

## 2019-10-20 DIAGNOSIS — C642 Malignant neoplasm of left kidney, except renal pelvis: Secondary | ICD-10-CM | POA: Diagnosis not present

## 2019-11-11 DIAGNOSIS — I129 Hypertensive chronic kidney disease with stage 1 through stage 4 chronic kidney disease, or unspecified chronic kidney disease: Secondary | ICD-10-CM | POA: Diagnosis not present

## 2019-11-11 DIAGNOSIS — C649 Malignant neoplasm of unspecified kidney, except renal pelvis: Secondary | ICD-10-CM | POA: Diagnosis not present

## 2019-11-11 DIAGNOSIS — Z905 Acquired absence of kidney: Secondary | ICD-10-CM | POA: Diagnosis not present

## 2019-11-11 DIAGNOSIS — N189 Chronic kidney disease, unspecified: Secondary | ICD-10-CM | POA: Diagnosis not present

## 2021-10-15 ENCOUNTER — Other Ambulatory Visit: Payer: Self-pay

## 2021-10-15 ENCOUNTER — Ambulatory Visit (INDEPENDENT_AMBULATORY_CARE_PROVIDER_SITE_OTHER): Payer: 59 | Admitting: Family Medicine

## 2021-10-15 ENCOUNTER — Encounter: Payer: Self-pay | Admitting: Family Medicine

## 2021-10-15 VITALS — BP 127/73 | HR 52 | Temp 98.2°F | Ht 60.0 in | Wt 124.0 lb

## 2021-10-15 DIAGNOSIS — L989 Disorder of the skin and subcutaneous tissue, unspecified: Secondary | ICD-10-CM

## 2021-10-15 NOTE — Patient Instructions (Signed)
  Great to see you today.  I have refilled the medication(s) we provide.   If labs were collected, we will inform you of lab results once received either by echart message or telephone call.   - echart message- for normal results that have been seen by the patient already.   - telephone call: abnormal results or if patient has not viewed results in their echart.   Referral to dermatology placed for you.

## 2021-10-15 NOTE — Progress Notes (Signed)
This visit occurred during the SARS-CoV-2 public health emergency.  Safety protocols were in place, including screening questions prior to the visit, additional usage of staff PPE, and extensive cleaning of exam room while observing appropriate contact time as indicated for disinfecting solutions.    Sharon Henson , 1961-09-28, 60 y.o., female MRN: [id number] Patient Care Team    Relationship Specialty Notifications Start End  Ma Hillock, DO PCP - General Family Medicine  10/30/18   Justin Mend, MD Attending Physician Nephrology  11/02/18   Lucas Mallow, MD Consulting Physician Urology  11/02/18     Chief Complaint  Patient presents with   Mass    Pt reports cyst on both legs that was noticed after shaving x 2 years;      Subjective: Pt presents for an OV with complaints of irritated skin lesions x2 on her lower legs. She states they have been there for a couple years, but become irritated and bleed frequently. She reports causing injury when she shaves over them. She denies h/o skin cancer.   Depression screen [facility] 2/9 10/15/2021 09/27/2019 04/28/2019 10/30/2018  Decreased Interest 0 0 0 3  Down, Depressed, Hopeless 0 0 0 3  PHQ - 2 Score 0 0 0 6  Altered sleeping - - - 3  Tired, decreased energy - - - 3  Change in appetite - - - 3  Feeling bad or failure about yourself  - - - 0  Trouble concentrating - - - 0  Moving slowly or fidgety/restless - - - 0  Suicidal thoughts - - - 0  PHQ-9 Score - - - 15  Difficult doing work/chores - - - Somewhat difficult    Henson Known Allergies Social History   Social History Narrative   Marital status/children/pets: married, 3 children.    Education/employment: Some college, [employer]   Safety:      -Wears a bicycle helmet riding a bike: Yes     -smoke alarm in the home:Yes     - wears seatbelt: Yes     - Feels safe in their relationships: Yes      Lives with husband   Caffeine use: 1 cup per day   Right handed     Past Medical History:  Diagnosis Date   Anemia 09/05/2018   Chronic kidney disease    Complication of anesthesia    "I don't get knocked out w/sodium pentothal" (09/09/2018)   Depression    Depression, recurrent ([place]) 11/26/2018   Frequent UTI    GERD (gastroesophageal reflux disease)    Hiatal hernia    History of blood transfusion 08/2018; 09/2018   /notes10/01/2018   Hypertension    Renal cell carcinoma ([place]) 09/2018   kidney   Retroperitoneal hematoma    /notes10/01/2018   Past Surgical History:  Procedure Laterality Date   AUGMENTATION MAMMAPLASTY Bilateral 2006   CESAREAN SECTION  1990; [place]   "to remove blood clot in my uterus"   IR ANGIO/SPINAL LEFT  09/14/2018   IR ANGIO/SPINAL LEFT  09/14/2018   IR ANGIOGRAM VISCERAL SELECTIVE  09/14/2018   IR ANGIOGRAM VISCERAL SELECTIVE  09/14/2018   IR FLUORO GUIDE CV LINE RIGHT  09/10/2018   IR RENAL SELECTIVE  UNI INC S&I MOD SED  09/14/2018   IR US GUIDE VASC ACCESS RIGHT  09/10/2018   IR US GUIDE VASC ACCESS RIGHT  09/14/2018   NEPHRECTOMY Left  09/22/2018   Procedure: NEPHRECTOMY;  Surgeon: Lucas Mallow, MD;  [facility];  Service: Urology;  Laterality: Left;  3 HRS   UMBILICAL HERNIA REPAIR  04/08/2007   Family History  Problem Relation Age of Onset   Heart disease Neg Hx    Colon cancer Neg Hx    Breast cancer Neg Hx    Allergies as of 10/15/2021   Henson Known Allergies      Medication List        Accurate as of October 15, 2021 10:37 AM. If you have any questions, ask your nurse or doctor.          STOP taking these medications    cetirizine 10 MG tablet Commonly known as: ZYRTEC Stopped by: Howard Pouch, DO       TAKE these medications    atorvastatin 10 MG tablet Commonly known as: LIPITOR Take 1 tablet (10 mg total) by mouth daily.   levocetirizine 5 MG tablet Commonly known as: XYZAL Take 5 mg by mouth every evening.   MULTIVITAMIN ADULT  PO Take 1 tablet by mouth daily.        All past medical history, surgical history, allergies, family history, immunizations andmedications were updated in the EMR today and reviewed under the history and medication portions of their EMR.     ROS: Negative, with the exception of above mentioned in HPI   Objective:  BP 127/73   Pulse (!) 52   Temp 98.2 F (36.8 C) (Oral)   Ht 5' (1.524 m)   Wt 124 lb (56.2 kg)   SpO2 98%   BMI 24.22 kg/m  Body mass index is 24.22 kg/m. Gen: Afebrile. Henson acute distress. Nontoxic in appearance, well developed, well nourished.  HENT: AT. [place].  Eyes:Pupils Equal Round Reactive to light, Extraocular movements intact,  Conjunctiva without redness, discharge or icterus. Skin: Henson rashes, purpura or petechiae. X1 round raised 2-3 mm hyperpigmented firm skin lesion of each LE (shin-right and calf - left) Neuro: Normal gait. PERLA. EOMi. Alert. Oriented x3   Henson results found. Henson results found. Henson results found for this or any previous visit (from the past 24 hour(s)).  Assessment/Plan: Sharon Henson is a 60 y.o. female present for OV for  Skin lesions Areas appear consistent with dermatofibromas. Pt was educated on type of skin lesion and that they are usually benign.  She would like to have removed 2/2 to irritation of lesions.  - Referred > Ambulatory referral to Dermatology  Reviewed expectations re: course of current medical issues. Discussed self-management of symptoms. Outlined signs and symptoms indicating need for more acute intervention. Patient verbalized understanding and all questions were answered. Patient received an After-Visit Summary.    Orders Placed This Encounter  Procedures   Ambulatory referral to Dermatology   Henson orders of the defined types were placed in this encounter.  Referral Orders         Ambulatory referral to Dermatology       Note is dictated utilizing voice recognition software. Although note has been  proof read prior to signing, occasional typographical errors still can be missed. If any questions arise, please do not hesitate to call for verification.   electronically signed by:  Howard Pouch, DO  [place]

## 2021-10-31 LAB — HM MAMMOGRAPHY

## 2021-11-05 ENCOUNTER — Telehealth: Payer: Self-pay | Admitting: Family Medicine

## 2021-11-05 DIAGNOSIS — Z5320 Procedure and treatment not carried out because of patient's decision for unspecified reasons: Secondary | ICD-10-CM | POA: Insufficient documentation

## 2021-11-05 NOTE — Telephone Encounter (Signed)
Please call pt  Her mammogram is normal.  Please abstract

## 2021-11-06 NOTE — Telephone Encounter (Signed)
Spoke with pt regarding labs and instructions.   

## 2022-08-28 ENCOUNTER — Encounter: Payer: Self-pay | Admitting: Family Medicine

## 2022-08-28 ENCOUNTER — Ambulatory Visit (INDEPENDENT_AMBULATORY_CARE_PROVIDER_SITE_OTHER): Payer: 59 | Admitting: Family Medicine

## 2022-08-28 VITALS — BP 118/72 | HR 62 | Temp 98.4°F | Wt 121.4 lb

## 2022-08-28 DIAGNOSIS — C649 Malignant neoplasm of unspecified kidney, except renal pelvis: Secondary | ICD-10-CM | POA: Diagnosis not present

## 2022-08-28 DIAGNOSIS — Z23 Encounter for immunization: Secondary | ICD-10-CM | POA: Diagnosis not present

## 2022-08-28 NOTE — Progress Notes (Signed)
Sharon Henson , September 15, 1961, 61 y.o., female MRN: [id number] Patient Care Team    Relationship Specialty Notifications Start End  Ma Hillock, DO PCP - General Family Medicine  10/30/18   Justin Mend, MD Attending Physician Nephrology  11/02/18   Lucas Mallow, MD Consulting Physician Urology  11/02/18     Chief Complaint  Patient presents with   Referral    Urology. Wants to stay with [facility] needs to schedule but can't without referral.     Subjective: Sharon Henson is a 61 y.o. female Pt presents for an OV to discuss urology referral. She has been est with Dr. Gloriann Loan of [facility] for her renal cell carcinoma. She reports she had been following with him about every 3 months. She was cleared to follow up every 1 year now, since 10/2021. She has CT scans and labs prior to her urology visit.        10/15/2021   10:20 AM 09/27/2019    1:07 PM 04/28/2019    9:29 AM 10/30/2018   12:58 PM  Depression screen PHQ 2/9  Decreased Interest 0 0 0 3  Down, Depressed, Hopeless 0 0 0 3  PHQ - 2 Score 0 0 0 6  Altered sleeping    3  Tired, decreased energy    3  Change in appetite    3  Feeling bad or failure about yourself     0  Trouble concentrating    0  Moving slowly or fidgety/restless    0  Suicidal thoughts    0  PHQ-9 Score    15  Difficult doing work/chores    Somewhat difficult    No Known Allergies Social History   Social History Narrative   Marital status/children/pets: married, 3 children.    Education/employment: Some college, [employer]   Safety:      -Wears a bicycle helmet riding a bike: Yes     -smoke alarm in the home:Yes     - wears seatbelt: Yes     - Feels safe in their relationships: Yes      Lives with husband   Caffeine use: 1 cup per day   Right handed    Past Medical History:  Diagnosis Date   Anemia 09/05/2018   Chronic kidney disease    Complication of anesthesia    "I don't get knocked out w/sodium  pentothal" (09/09/2018)   Depression    Depression, recurrent ([place]) 11/26/2018   Frequent UTI    GERD (gastroesophageal reflux disease)    Hiatal hernia    History of blood transfusion 08/2018; 09/2018   /notes10/01/2018   Hypertension    Renal cell carcinoma ([place]) 09/2018   kidney   Retroperitoneal hematoma    /notes10/01/2018   Past Surgical History:  Procedure Laterality Date   AUGMENTATION MAMMAPLASTY Bilateral 2006   CESAREAN SECTION  1990; [place]   "to remove blood clot in my uterus"   IR ANGIO/SPINAL LEFT  09/14/2018   IR ANGIO/SPINAL LEFT  09/14/2018   IR ANGIOGRAM VISCERAL SELECTIVE  09/14/2018   IR ANGIOGRAM VISCERAL SELECTIVE  09/14/2018   IR FLUORO GUIDE CV LINE RIGHT  09/10/2018   IR RENAL SELECTIVE  UNI INC S&I MOD SED  09/14/2018   IR US GUIDE VASC ACCESS RIGHT  09/10/2018   IR US GUIDE VASC ACCESS RIGHT  09/14/2018   NEPHRECTOMY Left  09/22/2018   Procedure: NEPHRECTOMY;  Surgeon: Lucas Mallow, MD;  [facility];  Service: Urology;  Laterality: Left;  3 HRS   UMBILICAL HERNIA REPAIR  04/08/2007   Family History  Problem Relation Age of Onset   Heart disease Neg Hx    Colon cancer Neg Hx    Breast cancer Neg Hx    Allergies as of 08/28/2022   No Known Allergies      Medication List        Accurate as of August 28, 2022  2:35 PM. If you have any questions, ask your nurse or doctor.          fluticasone 50 MCG/ACT nasal spray Commonly known as: FLONASE Place 1 spray into both nostrils daily.   levocetirizine 5 MG tablet Commonly known as: XYZAL Take 5 mg by mouth every evening.   MULTIVITAMIN ADULT PO Take 1 tablet by mouth daily.        All past medical history, surgical history, allergies, family history, immunizations andmedications were updated in the EMR today and reviewed under the history and medication portions of their EMR.     ROS Negative, with the exception of above mentioned in  HPI   Objective:  BP 118/72   Pulse 62   Temp 98.4 F (36.9 C)   Wt 121 lb 6.4 oz (55.1 kg)   SpO2 97%   BMI 23.71 kg/m  Body mass index is 23.71 kg/m. Physical Exam Vitals and nursing note reviewed.  Constitutional:      General: She is not in acute distress.    Appearance: Normal appearance. She is not ill-appearing, toxic-appearing or diaphoretic.  HENT:     Head: Normocephalic and atraumatic.  Eyes:     Extraocular Movements: Extraocular movements intact.     Conjunctiva/sclera: Conjunctivae normal.     Pupils: Pupils are equal, round, and reactive to light.  Cardiovascular:     Rate and Rhythm: Normal rate and regular rhythm.  Abdominal:     Tenderness: There is no right CVA tenderness or left CVA tenderness.  Musculoskeletal:     Right lower leg: No edema.     Left lower leg: No edema.  Skin:    Coloration: Skin is not pale.     Findings: No rash.  Neurological:     Mental Status: She is alert and oriented to person, place, and time. Mental status is at baseline.     Motor: No weakness.     Gait: Gait normal.  Psychiatric:        Mood and Affect: Mood normal.        Behavior: Behavior normal.        Thought Content: Thought content normal.        Judgment: Judgment normal.     No results found. No results found. No results found for this or any previous visit (from the past 24 hour(s)).  Assessment/Plan: Sharon Henson is a 61 y.o. female present for OV for  Need for immunization against influenza - Flu Vaccine QUAD 44moIM (Fluarix, Fluzone & Alfiuria Quad PF)  [place], [place] Requested patient ask them to give [place] copy of her CT and labs yearly. So we do not have duplicate labs for her.  - Ambulatory referral to Urology  Reviewed expectations re: course of current medical issues. Discussed self-management of symptoms. Outlined signs and symptoms indicating need for more acute intervention. Patient verbalized  understanding and all questions  were answered. Patient received an After-Visit Summary.    Orders Placed This Encounter  Procedures   Flu Vaccine QUAD 71moIM (Fluarix, Fluzone & Alfiuria Quad PF)   Ambulatory referral to Urology   No orders of the defined types were placed in this encounter.  Referral Orders         Ambulatory referral to Urology       Note is dictated utilizing voice recognition software. Although note has been proof read prior to signing, occasional typographical errors still can be missed. If any questions arise, please do not hesitate to call for verification.   electronically signed by:  RHoward Pouch DO  [place]

## 2022-10-21 DIAGNOSIS — Z85528 Personal history of other malignant neoplasm of kidney: Secondary | ICD-10-CM | POA: Diagnosis not present

## 2022-10-21 DIAGNOSIS — J984 Other disorders of lung: Secondary | ICD-10-CM | POA: Diagnosis not present

## 2022-10-21 DIAGNOSIS — C642 Malignant neoplasm of left kidney, except renal pelvis: Secondary | ICD-10-CM | POA: Diagnosis not present

## 2022-10-21 DIAGNOSIS — K5710 Diverticulosis of small intestine without perforation or abscess without bleeding: Secondary | ICD-10-CM | POA: Diagnosis not present

## 2022-10-21 DIAGNOSIS — I700 Atherosclerosis of aorta: Secondary | ICD-10-CM | POA: Diagnosis not present

## 2022-10-21 DIAGNOSIS — R918 Other nonspecific abnormal finding of lung field: Secondary | ICD-10-CM | POA: Diagnosis not present

## 2022-10-21 DIAGNOSIS — K769 Liver disease, unspecified: Secondary | ICD-10-CM | POA: Diagnosis not present

## 2022-11-05 ENCOUNTER — Encounter: Payer: Self-pay | Admitting: Gastroenterology

## 2022-12-12 ENCOUNTER — Other Ambulatory Visit (INDEPENDENT_AMBULATORY_CARE_PROVIDER_SITE_OTHER): Payer: 59

## 2022-12-12 ENCOUNTER — Encounter: Payer: Self-pay | Admitting: Gastroenterology

## 2022-12-12 ENCOUNTER — Ambulatory Visit: Payer: 59 | Admitting: Gastroenterology

## 2022-12-12 VITALS — BP 120/74 | HR 57 | Ht 60.0 in | Wt 126.2 lb

## 2022-12-12 DIAGNOSIS — R933 Abnormal findings on diagnostic imaging of other parts of digestive tract: Secondary | ICD-10-CM

## 2022-12-12 DIAGNOSIS — Z1211 Encounter for screening for malignant neoplasm of colon: Secondary | ICD-10-CM | POA: Diagnosis not present

## 2022-12-12 DIAGNOSIS — D649 Anemia, unspecified: Secondary | ICD-10-CM | POA: Diagnosis not present

## 2022-12-12 LAB — IBC + FERRITIN
Ferritin: 160 ng/mL (ref 10.0–291.0)
Iron: 50 ug/dL (ref 42–145)
Saturation Ratios: 14.2 % — ABNORMAL LOW (ref 20.0–50.0)
TIBC: 352.8 ug/dL (ref 250.0–450.0)
Transferrin: 252 mg/dL (ref 212.0–360.0)

## 2022-12-12 LAB — CBC WITH DIFFERENTIAL/PLATELET
Basophils Absolute: 0.1 10*3/uL (ref 0.0–0.1)
Basophils Relative: 1.2 % (ref 0.0–3.0)
Eosinophils Absolute: 0.3 10*3/uL (ref 0.0–0.7)
Eosinophils Relative: 4 % (ref 0.0–5.0)
HCT: 39.1 % (ref 36.0–46.0)
Hemoglobin: 12.8 g/dL (ref 12.0–15.0)
Lymphocytes Relative: 18.8 % (ref 12.0–46.0)
Lymphs Abs: 1.3 10*3/uL (ref 0.7–4.0)
MCHC: 32.8 g/dL (ref 30.0–36.0)
MCV: 86.7 fl (ref 78.0–100.0)
Monocytes Absolute: 0.5 10*3/uL (ref 0.1–1.0)
Monocytes Relative: 6.9 % (ref 3.0–12.0)
Neutro Abs: 4.7 10*3/uL (ref 1.4–7.7)
Neutrophils Relative %: 69.1 % (ref 43.0–77.0)
Platelets: 223 10*3/uL (ref 150.0–400.0)
RBC: 4.51 Mil/uL (ref 3.87–5.11)
RDW: 14 % (ref 11.5–15.5)
WBC: 6.8 10*3/uL (ref 4.0–10.5)

## 2022-12-12 NOTE — Patient Instructions (Signed)
_______________________________________________________  If you are age 64 or older, your body mass index should be between 23-30. Your Body mass index is 24.65 kg/m. If this is out of the aforementioned range listed, please consider follow up with your Primary Care Provider.  If you are age 67 or younger, your body mass index should be between 19-25. Your Body mass index is 24.65 kg/m. If this is out of the aformentioned range listed, please consider follow up with your Primary Care Provider.   ________________________________________________________  The [facility] providers would like to encourage you to use [facility] to communicate with providers for non-urgent requests or questions.  Due to long hold times on the telephone, sending your provider a message by [facility] may be a faster and more efficient way to get a response.  Please allow 48 business hours for a response.  Please remember that this is for non-urgent requests.  _______________________________________________________  Your provider has requested that you go to the basement level for lab work before leaving today. Press "B" on the elevator. The lab is located at the first door on the left as you exit the elevator.  You have been scheduled for an endoscopy. Please follow written instructions given to you at your visit today. If you use inhalers (even only as needed), please bring them with you on the day of your procedure.  Your provider has ordered Cologuard testing as an option for colon cancer screening. This is performed by [employer] and may be out of network with your insurance. PRIOR to completing the test, it is YOUR responsibility to contact your insurance about covered benefits for this test. Your out of pocket expense could be anywhere from $0.00 to $649.00.   When you call to check coverage with your insurer, please provide the following information:   -The ONLY provider of Cologuard is [place] code for Cologuard is [phone].  [employer] NPI # [id number]  -[employer] Tax ID # [id number]   We have already sent your demographic and insurance information to [employer] (phone number [phone]) and they should contact you within the next week regarding your test. If you have not heard from them within the next week, please call our office at [phone].

## 2022-12-12 NOTE — Progress Notes (Unsigned)
HPI : Sharon Henson is a very pleasant 62 year old female with a history of renal cell carcinoma status post nephrectomy and depression who is referred to [place] by Dr. Howard Pouch for abnormal CT findings.  The patient underwent a routine surveillance CT for renal cell carcinoma on October 22, 2022.  The report states that the stomach is minimally distended limiting evaluation.  Possible focal wall thickening versus underdistention of the gastric antrum.  The images are not available for review, but the patient brought a copy of the CT report. The patient denies  upper GI symptoms such as abdominal pain, dyspepsia, nausea/vomiting, early satiety, decreased appetite or weight loss.  She does have a chronic history of GERD, and used to take omeprazole, but was advised to stop following her nephrectomy.  Currently, she does not take any medications for GERD.  She has symptoms a few times a week. She is a non-smoker.  She does not take any NSAIDs. She has no family history of GI malignancy.  She has never undergone a colonoscopy, but has been using stool based test for colon cancer screening.  Her last Cologuard was in 2020.  She has no lower GI symptoms such as constipation, diarrhea or blood in the stool.  No family history of colon cancer.   Past Medical History:  Diagnosis Date   Anemia 09/05/2018   Chronic kidney disease    Complication of anesthesia    "I don't get knocked out w/sodium pentothal" (09/09/2018)   Depression    Depression, recurrent ([place]) 11/26/2018   Frequent UTI    GERD (gastroesophageal reflux disease)    Hiatal hernia    History of blood transfusion 08/2018; 09/2018   /notes10/01/2018   Hypertension    Renal cell carcinoma ([place]) 09/2018   kidney   Retroperitoneal hematoma    /notes10/01/2018     Past Surgical History:  Procedure Laterality Date   AUGMENTATION MAMMAPLASTY Bilateral 2006   [place]; [place]   "to  remove blood clot in my uterus"   IR ANGIO/SPINAL LEFT  09/14/2018   IR ANGIO/SPINAL LEFT  09/14/2018   IR ANGIOGRAM VISCERAL SELECTIVE  09/14/2018   IR ANGIOGRAM VISCERAL SELECTIVE  09/14/2018   IR FLUORO GUIDE CV LINE RIGHT  09/10/2018   IR RENAL SELECTIVE  UNI INC S&I MOD SED  09/14/2018   IR US GUIDE VASC ACCESS RIGHT  09/10/2018   IR US GUIDE VASC ACCESS RIGHT  09/14/2018   NEPHRECTOMY Left 09/22/2018   Procedure: NEPHRECTOMY;  Surgeon: Lucas Mallow, MD;  Location: [facility];  Service: Urology;  Laterality: Left;  3 HRS   UMBILICAL HERNIA REPAIR  04/08/2007   Family History  Problem Relation Age of Onset   Heart disease Neg Hx    Colon cancer Neg Hx    Breast cancer Neg Hx    Esophageal cancer Neg Hx    Stomach cancer Neg Hx    Social History   Tobacco Use   Smoking status: Former    Packs/day: 0.10    Years: 2.00    Total pack years: 0.20    Types: Cigarettes   Smokeless tobacco: Never   Tobacco comments:    09/09/2018 "quit smoking in the late 1980s"  Vaping Use   Vaping Use: Never used  Substance Use Topics   Alcohol use: Not Currently    Comment: 09/09/2018 "might have 1 drink/month"   Drug use: Never  Current Outpatient Medications  Medication Sig Dispense Refill   levocetirizine (XYZAL) 5 MG tablet Take 5 mg by mouth every evening.     Multiple Vitamins-Minerals (MULTIVITAMIN ADULT PO) Take 1 tablet by mouth daily.     fluticasone (FLONASE) 50 MCG/ACT nasal spray Place 1 spray into both nostrils daily.     No current facility-administered medications for this visit.   No Known Allergies   Review of Systems: All systems reviewed and negative except where noted in HPI.    No results found.  Physical Exam: BP 120/74   Pulse (!) 57   Ht 5' (1.524 m)   Wt 126 lb 3.2 oz (57.2 kg)   SpO2 97%   BMI 24.65 kg/m  Constitutional: Pleasant,well-developed, Caucasian female in no acute distress. HEENT: Normocephalic and atraumatic. Conjunctivae are normal. No  scleral icterus. Neck supple.  Cardiovascular: Normal rate, regular rhythm.  Pulmonary/chest: Effort normal and breath sounds normal. No wheezing, rales or rhonchi. Abdominal: Soft, nondistended, nontender. Bowel sounds active throughout. There are no masses palpable. No hepatomegaly. Extremities: no edema Lymphadenopathy: No cervical adenopathy noted. Neurological: Alert and oriented to person place and time. Skin: Skin is warm and dry. No rashes noted. Psychiatric: Normal mood and affect. Behavior is normal.  CBC    Component Value Date/Time   WBC 5.1 05/12/2019 0935   RBC 3.98 05/12/2019 0935   HGB 11.5 (L) 05/12/2019 0935   HCT 34.7 (L) 05/12/2019 0935   HCT 31 11/18/2018 0000   PLT 193.0 05/12/2019 0935   PLT 267 11/18/2018 0000   MCV 87.1 05/12/2019 0935   MCV 87 11/18/2018 0000   MCH 28.3 11/18/2018 0000   MCHC 33.0 05/12/2019 0935   RDW 14.3 05/12/2019 0935   RDW 14.6 11/18/2018 0000   LYMPHSABS 0.8 05/12/2019 0935   LYMPHSABS 12 11/18/2018 0000   LYMPHSABS 0.8 11/18/2018 0000   MONOABS 0.5 05/12/2019 0935   EOSABS 0.3 05/12/2019 0935   BASOSABS 0.1 05/12/2019 0935    CMP     Component Value Date/Time   NA 138 05/12/2019 0935   NA 140 11/18/2018 0000   K 5.0 05/12/2019 0935   K 4.8 11/18/2018 0000   CL 101 05/12/2019 0935   CL 103 11/18/2018 0000   CO2 32 05/12/2019 0935   CO2 29 11/18/2018 0000   GLUCOSE 100 (H) 05/12/2019 0935   BUN 21 05/12/2019 0935   BUN 16 11/18/2018 0000   CREATININE 0.96 05/12/2019 0935   CREATININE 0.86 11/18/2018 0000   CALCIUM 9.8 05/12/2019 0935   CALCIUM 10.4 11/18/2018 0000   PROT 7.4 09/27/2019 1339   ALBUMIN 4.7 09/27/2019 1339   ALBUMIN 4.2 11/18/2018 0000   AST 16 09/27/2019 1339   ALT 11 09/27/2019 1339   ALKPHOS 84 09/27/2019 1339   BILITOT 0.4 09/27/2019 1339   GFRNONAA 74 11/18/2018 0000   GFRAA 86 11/18/2018 0000     ASSESSMENT AND PLAN: 62 year old female with history of renal cell carcinoma status post  nephrectomy in 2019, with incidentally noted questionable antral thickening on surveillance CT.  She has no upper GI symptoms, and no risk factors for gastric cancer.  She does not take NSAIDs.  Although this is most likely radiologic artifact from gastric underdistention, an upper endoscopy is not unreasonable to completely rule out pathology.  Will schedule the patient for routine EGD. A colonoscopy was also offered to the patient, but she would prefer to continue to do stool based test.  She has no contraindication to  a stool based test, so we will order a Cologuard for her. The patient's hemoglobin was slightly low when last checked in her records.  I like to recheck this with an iron panel.  Questionable antral thickening on CT - EGD  Colon cancer screening - Cologuard  Mild anemia - Repeat CBC, iron panel  The details, risks (including bleeding, perforation, infection, missed lesions, medication reactions and possible hospitalization or surgery if complications occur), benefits, and alternatives to EGD with possible biopsy and possible dilation were discussed with the patient and she consents to proceed.    Katrisha Segall E. Candis Schatz, MD [facility]  CC:  Ma Hillock, DO

## 2022-12-16 ENCOUNTER — Encounter: Payer: Self-pay | Admitting: Gastroenterology

## 2022-12-18 NOTE — Progress Notes (Signed)
[name],  As discussed your CBC showed a normal hemoglobin with a normal MCV.  Your iron panel showed a slightly low saturation, but normal serum iron, ferritin and TIBC.  These results are not consistent with iron deficiency anemia.  I do not recommend iron supplementation or colonoscopy.  Plan to proceed with the EGD and Cologuard as originally discussed

## 2023-01-12 LAB — COLOGUARD: COLOGUARD: NEGATIVE

## 2023-01-13 ENCOUNTER — Ambulatory Visit (AMBULATORY_SURGERY_CENTER): Payer: 59 | Admitting: Gastroenterology

## 2023-01-13 ENCOUNTER — Encounter: Payer: Self-pay | Admitting: Gastroenterology

## 2023-01-13 VITALS — BP 136/79 | HR 57 | Temp 96.8°F | Resp 13 | Ht 60.0 in | Wt 126.0 lb

## 2023-01-13 DIAGNOSIS — K31A11 Gastric intestinal metaplasia without dysplasia, involving the antrum: Secondary | ICD-10-CM | POA: Diagnosis not present

## 2023-01-13 DIAGNOSIS — K317 Polyp of stomach and duodenum: Secondary | ICD-10-CM

## 2023-01-13 DIAGNOSIS — R933 Abnormal findings on diagnostic imaging of other parts of digestive tract: Secondary | ICD-10-CM | POA: Diagnosis not present

## 2023-01-13 DIAGNOSIS — K2100 Gastro-esophageal reflux disease with esophagitis, without bleeding: Secondary | ICD-10-CM | POA: Diagnosis not present

## 2023-01-13 DIAGNOSIS — K2981 Duodenitis with bleeding: Secondary | ICD-10-CM

## 2023-01-13 DIAGNOSIS — K2980 Duodenitis without bleeding: Secondary | ICD-10-CM | POA: Diagnosis not present

## 2023-01-13 DIAGNOSIS — D649 Anemia, unspecified: Secondary | ICD-10-CM

## 2023-01-13 MED ORDER — SODIUM CHLORIDE 0.9 % IV SOLN: 500.0000 mL | Freq: Once | INTRAVENOUS | Status: DC

## 2023-01-13 NOTE — Addendum Note (Signed)
Addended by: Etheleen Nicks on: 01/13/2023 12:15 PM   Modules accepted: Orders

## 2023-01-13 NOTE — Progress Notes (Signed)
VS completed by DT.  Pt's states no medical or surgical changes since previsit or office visit.  

## 2023-01-13 NOTE — Op Note (Signed)
[place] Patient Name: Sharon Henson Procedure Date: 01/13/2023 10:16 AM MRN: [id number] Endoscopist: Nicki Reaper E. Candis Schatz , MD, [id number] Age: 62 Referring MD:  Date of Birth: Aug 22, 1961 Gender: Female Account #: [id number] Procedure:                Upper GI endoscopy Indications:              Abnormal CT of the GI tract Medicines:                Monitored Anesthesia Care Procedure:                Pre-Anesthesia Assessment:                           - Prior to the procedure, a History and Physical                            was performed, and patient medications and                            allergies were reviewed. The patient's tolerance of                            previous anesthesia was also reviewed. The risks                            and benefits of the procedure and the sedation                            options and risks were discussed with the patient.                            All questions were answered, and informed consent                            was obtained. Prior Anticoagulants: The patient has                            taken no anticoagulant or antiplatelet agents. ASA                            Grade Assessment: II - A patient with mild systemic                            disease. After reviewing the risks and benefits,                            the patient was deemed in satisfactory condition to                            undergo the procedure.                           After obtaining informed consent, the endoscope was  passed under direct vision. Throughout the                            procedure, the patient's blood pressure, pulse, and                            oxygen saturations were monitored continuously. The                            Olympus Scope [id number] was introduced through the                            mouth, and advanced to the second part of duodenum.                            The upper GI  endoscopy was accomplished without                            difficulty. The patient tolerated the procedure                            well. Scope In: Scope Out: Findings:                 The examined portions of the nasopharynx,                            oropharynx and larynx were normal.                           There were esophageal mucosal changes suspicious                            for short-segment Barrett's esophagus, classified                            as Barrett's stage C1-M1 per Prague criteria                            present in the distal esophagus. The maximum                            longitudinal extent of these mucosal changes was 1                            cm in length. Mucosa was biopsied with a cold                            forceps for histology in 4 quadrants at 37 cm from                            the incisors. One specimen bottle was sent to  pathology. Estimated blood loss was minimal.                           The exam of the esophagus was otherwise normal.                           Multiple small sessile polypoid lesions were found                            in the prepyloric region of the stomach. Biopsies                            were taken with a cold forceps for Helicobacter                            pylori testing. Estimated blood loss was minimal.                           The exam of the stomach was otherwise normal.                           A few erosions without bleeding were found in the                            duodenal bulb. Biopsies were taken with a cold                            forceps for histology. Estimated blood loss was                            minimal.                           The exam of the duodenum was otherwise normal. Complications:            No immediate complications. Estimated Blood Loss:     Estimated blood loss was minimal. Impression:               - The examined portions of the  nasopharynx,                            oropharynx and larynx were normal.                           - Esophageal mucosal changes suspicious for                            short-segment Barrett's esophagus, classified as                            Barrett's stage C1-M1 per Prague criteria. Biopsied.                           - Multiple gastric polyps in the antrum/prepyloric  region. Biopsied. Suspect these are                            reactive/inflammatory                           - Duodenal erosions without bleeding. Biopsied. Recommendation:           - Patient has a contact number available for                            emergencies. The signs and symptoms of potential                            delayed complications were discussed with the                            patient. Return to normal activities tomorrow.                            Written discharge instructions were provided to the                            patient.                           - Resume previous diet.                           - Continue present medications.                           - Await pathology results. Satoria Dunlop E. Candis Schatz, MD 01/13/2023 10:39:02 AM This report has been signed electronically.

## 2023-01-13 NOTE — Progress Notes (Signed)
Report to pacu rn. Vss. Care resumed by rn. 

## 2023-01-13 NOTE — Patient Instructions (Signed)
YOU HAD AN ENDOSCOPIC PROCEDURE TODAY AT [facility]:   Refer to the procedure report that was given to you for any specific questions about what was found during the examination.  If the procedure report does not answer your questions, please call your gastroenterologist to clarify.  If you requested that your care partner not be given the details of your procedure findings, then the procedure report has been included in a sealed envelope for you to review at your convenience later.  YOU SHOULD EXPECT: Some feelings of bloating in the abdomen. Passage of more gas than usual.  Walking can help get rid of the air that was put into your GI tract during the procedure and reduce the bloating. If you had a lower endoscopy (such as a colonoscopy or flexible sigmoidoscopy) you may notice spotting of blood in your stool or on the toilet paper. If you underwent a bowel prep for your procedure, you may not have a normal bowel movement for a few days.  Please Note:  You might notice some irritation and congestion in your nose or some drainage.  This is from the oxygen used during your procedure.  There is no need for concern and it should clear up in a day or so.  SYMPTOMS TO REPORT IMMEDIATELY:   Following upper endoscopy (EGD)  Vomiting of blood or coffee ground material  New chest pain or pain under the shoulder blades  Painful or persistently difficult swallowing  New shortness of breath  Fever of 100F or higher  Black, tarry-looking stools  For urgent or emergent issues, a gastroenterologist can be reached at any hour by calling [phone]. Do not use MyChart messaging for urgent concerns.    DIET:  We do recommend a small meal at first, but then you may proceed to your regular diet.  Drink plenty of fluids but you should avoid alcoholic beverages for 24 hours.  MEDICATIONS: Continue present medications.  FOLLOW UP: Await pathology results for further advisement.  Thank you  for allowing [place] to provide for your healthcare needs today.  ACTIVITY:  You should plan to take it easy for the rest of today and you should NOT DRIVE or use heavy machinery until tomorrow (because of the sedation medicines used during the test).    FOLLOW UP: Our staff will call the number listed on your records the next business day following your procedure.  We will call around 7:15- 8:00 am to check on you and address any questions or concerns that you may have regarding the information given to you following your procedure. If we do not reach you, we will leave a message.     If any biopsies were taken you will be contacted by phone or by letter within the next 1-3 weeks.  Please call us at [phone] if you have not heard about the biopsies in 3 weeks.    SIGNATURES/CONFIDENTIALITY: You and/or your care partner have signed paperwork which will be entered into your electronic medical record.  These signatures attest to the fact that that the information above on your After Visit Summary has been reviewed and is understood.  Full responsibility of the confidentiality of this discharge information lies with you and/or your care-partner.

## 2023-01-13 NOTE — Progress Notes (Signed)
[facility] History and Physical   Primary Care Physician:  Ma Hillock, DO   Reason for Procedure:   Abnormal imaging of GI tract  Plan:    EGD     HPI: Sharon Henson is a 62 y.o. female undergoing EGD after she was found to have antral thickening on a routine surveillance CT for history of renal cell carcinoma.  She has a remote history of GERD, but not currently, and does not take any acid suppressive medications.  No chronic upper GI symptoms.   Past Medical History:  Diagnosis Date   Anemia 09/05/2018   Chronic kidney disease    Complication of anesthesia    "I don't get knocked out w/sodium pentothal" (09/09/2018)   Depression    Depression, recurrent ([place]) 11/26/2018   Frequent UTI    GERD (gastroesophageal reflux disease)    Hiatal hernia    History of blood transfusion 08/2018; 09/2018   /notes10/01/2018   Hypertension    Renal cell carcinoma ([place]) 09/2018   kidney   Retroperitoneal hematoma    /notes10/01/2018    Past Surgical History:  Procedure Laterality Date   AUGMENTATION MAMMAPLASTY Bilateral 2006   [place]; [place]   "to remove blood clot in my uterus"   IR ANGIO/SPINAL LEFT  09/14/2018   IR ANGIO/SPINAL LEFT  09/14/2018   IR ANGIOGRAM VISCERAL SELECTIVE  09/14/2018   IR ANGIOGRAM VISCERAL SELECTIVE  09/14/2018   IR FLUORO GUIDE CV LINE RIGHT  09/10/2018   IR RENAL SELECTIVE  UNI INC S&I MOD SED  09/14/2018   IR US GUIDE VASC ACCESS RIGHT  09/10/2018   IR US GUIDE VASC ACCESS RIGHT  09/14/2018   NEPHRECTOMY Left 09/22/2018   Procedure: NEPHRECTOMY;  Surgeon: Lucas Mallow, MD;  Location: [facility];  Service: Urology;  Laterality: Left;  3 HRS   UMBILICAL HERNIA REPAIR  04/08/2007    Prior to Admission medications   Medication Sig Start Date End Date Taking? Authorizing Provider  levocetirizine (XYZAL) 5 MG tablet Take 5 mg by mouth every evening.   Yes [provider]   Multiple Vitamins-Minerals (MULTIVITAMIN ADULT PO) Take 1 tablet by mouth daily.   Yes [provider]    Current Outpatient Medications  Medication Sig Dispense Refill   levocetirizine (XYZAL) 5 MG tablet Take 5 mg by mouth every evening.     Multiple Vitamins-Minerals (MULTIVITAMIN ADULT PO) Take 1 tablet by mouth daily.     Current Facility-Administered Medications  Medication Dose Route Frequency Provider Last Rate Last Admin   0.9 %  sodium chloride infusion  500 mL Intravenous Once Daryel November, MD        Allergies as of 01/13/2023   (No Known Allergies)    Family History  Problem Relation Age of Onset   Heart disease Neg Hx    Colon cancer Neg Hx    Breast cancer Neg Hx    Esophageal cancer Neg Hx    Stomach cancer Neg Hx     Social History   Socioeconomic History   Marital status: Married    Spouse name: Not on file   Number of children: 3   Years of education: Not on file   Highest education level: Not on file  Occupational History   Occupation: Retired  Tobacco Use   Smoking status: Former    Packs/day: 0.10    Years: 2.00  Total pack years: 0.20    Types: Cigarettes   Smokeless tobacco: Never   Tobacco comments:    09/09/2018 "quit smoking in the late 1980s"  Vaping Use   Vaping Use: Never used  Substance and Sexual Activity   Alcohol use: Not Currently    Comment: 09/09/2018 "might have 1 drink/month"   Drug use: Never   Sexual activity: Yes    Partners: Male  Other Topics Concern   Not on file  Social History Narrative   Marital status/children/pets: married, 3 children.    Education/employment: Some college, [employer]   Safety:      -Wears a bicycle helmet riding a bike: Yes     -smoke alarm in the home:Yes     - wears seatbelt: Yes     - Feels safe in their relationships: Yes      Lives with husband   Caffeine use: 1 cup per day   Right handed    Social Determinants of Health   Financial Resource Strain: Low  Risk  (09/06/2018)   Overall Financial Resource Strain (CARDIA)    Difficulty of Paying Living Expenses: Not hard at all  Food Insecurity: No Food Insecurity (09/06/2018)   Hunger Vital Sign    Worried About Running Out of Food in the Last Year: Never true    Ran Out of Food in the Last Year: Never true  Transportation Needs: No Transportation Needs (09/06/2018)   PRAPARE - Hydrologist (Medical): No    Lack of Transportation (Non-Medical): No  Physical Activity: Sufficiently Active (09/06/2018)   Exercise Vital Sign    Days of Exercise per Week: 7 days    Minutes of Exercise per Session: 60 min  Stress: No Stress Concern Present (09/06/2018)   [place]    Feeling of Stress : Not at all  Social Connections: Somewhat Isolated (09/06/2018)   Social Connection and Isolation Panel [NHANES]    Frequency of Communication with Friends and Family: More than three times a week    Frequency of Social Gatherings with Friends and Family: More than three times a week    Attends Religious Services: Never    Marine scientist or Organizations: No    Attends Archivist Meetings: Never    Marital Status: Married  Human resources officer Violence: Unknown (09/06/2018)   Humiliation, Afraid, Rape, and Kick questionnaire    Fear of Current or Ex-Partner: Patient refused    Emotionally Abused: Patient refused    Physically Abused: Patient refused    Sexually Abused: Patient refused    Review of Systems:  All other review of systems negative except as mentioned in the HPI.  Physical Exam: Vital signs BP (!) 166/70   Pulse (!) 54   Temp (!) 96.8 F (36 C) (Temporal)   Ht 5' (1.524 m)   Wt 126 lb (57.2 kg)   SpO2 98%   BMI 24.61 kg/m   General:   Alert,  Well-developed, well-nourished, pleasant and cooperative in NAD Airway:  Mallampati 2 Lungs:  Clear throughout to auscultation.   Heart:   Regular rate and rhythm; no murmurs, clicks, rubs,  or gallops. Abdomen:  Soft, nontender and nondistended. Normal bowel sounds.   Neuro/Psych:  Normal mood and affect. A and O x 3   Darlen Gledhill E. Candis Schatz, MD [facility]

## 2023-01-13 NOTE — Progress Notes (Signed)
Sharon Henson,  Your Cologuard test was negative.  You will be due for colon cancer screening again in 3 years.

## 2023-01-13 NOTE — Progress Notes (Signed)
Called to room to assist during endoscopic procedure.  Patient ID and intended procedure confirmed with present staff. Received instructions for my participation in the procedure from the performing physician.  

## 2023-01-14 ENCOUNTER — Telehealth: Payer: Self-pay

## 2023-01-14 NOTE — Telephone Encounter (Signed)
  Follow up Call-     01/13/2023    9:43 AM  Call back number  Post procedure Call Back phone  # [phone]  Permission to leave phone message Yes     Patient questions:  Do you have a fever, pain , or abdominal swelling? No. Pain Score  0 *  Have you tolerated food without any problems? Yes.    Have you been able to return to your normal activities? Yes.    Do you have any questions about your discharge instructions: Diet   No. Medications  No. Follow up visit  No.  Do you have questions or concerns about your Care? No.  Actions: * If pain score is 4 or above: No action needed, pain <4.

## 2023-01-30 NOTE — Progress Notes (Signed)
Sharon Henson,  The biopsies of your duodenum showed mild inflammatory changes called peptic duodenitis (inflammation of the duodenum caused by stomach acid).  This requires no therapy or further evaluation.   The biopsies of your esophagus did not show evidence of Barrett's esophagus (the condition associated with an increased risk of esophageal cancer).  The biopsies showed inflammatory changes related to acid reflux.  Given the absence of symptoms and overt inflammation, I do not think it is necessary for you to take any medications for this.  The biopsies of your stomach, however, showed a change in the lining of the stomach called gastric intestinal metaplasia.  This finding is thought to represent an increased risk of stomach cancer in some patients.  Patients at highest risk are those patients with a family history of stomach cancer and those with a smoking history, as well as patients who are from areas with high rates of stomach cancer. Periodic surveillance (repeat endoscopy with biopsies) is often performed to monitor for development of cancer or further precancerous changes. I recommend we repeat an upper endoscopy in 1 year to re-biopsy and re-examine your stomach.

## 2023-01-31 ENCOUNTER — Encounter: Payer: Self-pay | Admitting: Gastroenterology

## 2023-04-28 DIAGNOSIS — R911 Solitary pulmonary nodule: Secondary | ICD-10-CM | POA: Diagnosis not present

## 2023-04-28 DIAGNOSIS — J984 Other disorders of lung: Secondary | ICD-10-CM | POA: Diagnosis not present

## 2023-05-06 DIAGNOSIS — C642 Malignant neoplasm of left kidney, except renal pelvis: Secondary | ICD-10-CM | POA: Diagnosis not present

## 2023-07-23 ENCOUNTER — Ambulatory Visit: Payer: 59 | Admitting: Family Medicine

## 2023-09-17 ENCOUNTER — Encounter: Payer: Self-pay | Admitting: Family Medicine

## 2023-09-17 ENCOUNTER — Ambulatory Visit (INDEPENDENT_AMBULATORY_CARE_PROVIDER_SITE_OTHER): Payer: 59 | Admitting: Family Medicine

## 2023-09-17 VITALS — BP 118/71 | HR 57 | Temp 98.1°F | Ht 61.81 in | Wt 129.6 lb

## 2023-09-17 DIAGNOSIS — I700 Atherosclerosis of aorta: Secondary | ICD-10-CM

## 2023-09-17 DIAGNOSIS — E2839 Other primary ovarian failure: Secondary | ICD-10-CM

## 2023-09-17 DIAGNOSIS — Z5320 Procedure and treatment not carried out because of patient's decision for unspecified reasons: Secondary | ICD-10-CM

## 2023-09-17 DIAGNOSIS — E785 Hyperlipidemia, unspecified: Secondary | ICD-10-CM

## 2023-09-17 DIAGNOSIS — I2510 Atherosclerotic heart disease of native coronary artery without angina pectoris: Secondary | ICD-10-CM | POA: Diagnosis not present

## 2023-09-17 DIAGNOSIS — Z23 Encounter for immunization: Secondary | ICD-10-CM | POA: Diagnosis not present

## 2023-09-17 DIAGNOSIS — Z0000 Encounter for general adult medical examination without abnormal findings: Secondary | ICD-10-CM | POA: Diagnosis not present

## 2023-09-17 DIAGNOSIS — Z1231 Encounter for screening mammogram for malignant neoplasm of breast: Secondary | ICD-10-CM | POA: Diagnosis not present

## 2023-09-17 DIAGNOSIS — Z131 Encounter for screening for diabetes mellitus: Secondary | ICD-10-CM | POA: Diagnosis not present

## 2023-09-17 DIAGNOSIS — Z905 Acquired absence of kidney: Secondary | ICD-10-CM

## 2023-09-17 LAB — COMPREHENSIVE METABOLIC PANEL
ALT: 15 U/L (ref 0–35)
AST: 20 U/L (ref 0–37)
Albumin: 4.5 g/dL (ref 3.5–5.2)
Alkaline Phosphatase: 102 U/L (ref 39–117)
BUN: 20 mg/dL (ref 6–23)
CO2: 31 meq/L (ref 19–32)
Calcium: 9.8 mg/dL (ref 8.4–10.5)
Chloride: 102 meq/L (ref 96–112)
Creatinine, Ser: 0.77 mg/dL (ref 0.40–1.20)
GFR: 81.35 mL/min (ref >60.00)
Glucose, Bld: 86 mg/dL (ref 70–99)
Potassium: 4 meq/L (ref 3.5–5.1)
Sodium: 141 meq/L (ref 135–145)
Total Bilirubin: 0.4 mg/dL (ref 0.2–1.2)
Total Protein: 7 g/dL (ref 6.0–8.3)

## 2023-09-17 LAB — CBC WITH DIFFERENTIAL/PLATELET
Basophils Absolute: 0.1 10*3/uL (ref 0.0–0.1)
Basophils Relative: 1.3 % (ref 0.0–3.0)
Eosinophils Absolute: 0.3 10*3/uL (ref 0.0–0.7)
Eosinophils Relative: 3.9 % (ref 0.0–5.0)
HCT: 38.8 % (ref 36.0–46.0)
Hemoglobin: 12.5 g/dL (ref 12.0–15.0)
Lymphocytes Relative: 19.5 % (ref 12.0–46.0)
Lymphs Abs: 1.3 10*3/uL (ref 0.7–4.0)
MCHC: 32.2 g/dL (ref 30.0–36.0)
MCV: 88 fL (ref 78.0–100.0)
Monocytes Absolute: 0.6 10*3/uL (ref 0.1–1.0)
Monocytes Relative: 9.5 % (ref 3.0–12.0)
Neutro Abs: 4.4 10*3/uL (ref 1.4–7.7)
Neutrophils Relative %: 65.8 % (ref 43.0–77.0)
Platelets: 188 10*3/uL (ref 150.0–400.0)
RBC: 4.41 Mil/uL (ref 3.87–5.11)
RDW: 13.4 % (ref 11.5–15.5)
WBC: 6.7 10*3/uL (ref 4.0–10.5)

## 2023-09-17 LAB — LIPID PANEL
Cholesterol: 214 mg/dL — ABNORMAL HIGH (ref 0–200)
HDL: 57.5 mg/dL (ref >39.00)
LDL Cholesterol: 127 mg/dL — ABNORMAL HIGH (ref 0–99)
NonHDL: 156.99
Total CHOL/HDL Ratio: 4
Triglycerides: 149 mg/dL (ref 0.0–149.0)
VLDL: 29.8 mg/dL (ref 0.0–40.0)

## 2023-09-17 LAB — TSH: TSH: 3.53 u[IU]/mL (ref 0.35–5.50)

## 2023-09-17 LAB — HEMOGLOBIN A1C: Hgb A1c MFr Bld: 5.7 % (ref 4.6–6.5)

## 2023-09-17 NOTE — Patient Instructions (Addendum)
Return in about 1 year (around 09/17/2024) for cpe (20 min).        Great to see you today.  I have refilled the medication(s) we provide.   If labs were collected or images ordered, we will inform you of  results once we have received them and reviewed. We will contact you either by echart message, or telephone call.  Please give ample time to the testing facility, and our office to run,  receive and review results. Please do not call inquiring of results, even if you can see them in your chart. We will contact you as soon as we are able. If it has been over 1 week since the test was completed, and you have not yet heard from [place], then please call us.    - echart message- for normal results that have been seen by the patient already.   - telephone call: abnormal results or if patient has not viewed results in their echart.  If a referral to a specialist was entered for you, please call us in 2 weeks if you have not heard from the specialist office to schedule.

## 2023-09-17 NOTE — Progress Notes (Signed)
Patient ID: Sharon Henson, female  DOB: [birth year], 62 y.o.   MRN: [id number] Patient Care Team    Relationship Specialty Notifications Start End  Natalia Leatherwood, DO PCP - General Family Medicine  10/30/18   Tyler Pita, MD Attending Physician Nephrology  11/02/18   Crista Elliot, MD Consulting Physician Urology  11/02/18   Jenel Lucks, MD Consulting Physician Gastroenterology  09/17/23     Chief Complaint  Patient presents with   Annual Exam    [facility]    Subjective:  Sharon Henson is a 62 y.o.  Female  present for CPE . All past medical history, surgical history, allergies, family history, immunizations, medications and social history were updated in the electronic medical record today. All recent labs, ED visits and hospitalizations within the last year were reviewed.  Health maintenance:  Colonoscopy: Cologuard completed 12/24/2022 Mammogram: Last mammogram 10/2021,order placed for [facility] on [address]  Cervical cancer screening: Last pap approximately 12/27/2018.  By PCP. Immunizations: Tetanus UTD 06/2019, influenza administered , Shingrix series completed Infectious disease screening: HIV and hep C completed. DEXA:ordered, same lx as mam Patient has a Dental home. Hospitalizations/ED visits: Reviewed       10/15/2021   10:20 AM 09/27/2019    1:07 PM 04/28/2019    9:29 AM 10/30/2018   12:58 PM  Depression screen PHQ 2/9  Decreased Interest 0 0 0 3  Down, Depressed, Hopeless 0 0 0 3  PHQ - 2 Score 0 0 0 6  Altered sleeping    3  Tired, decreased energy    3  Change in appetite    3  Feeling bad or failure about yourself     0  Trouble concentrating    0  Moving slowly or fidgety/restless    0  Suicidal thoughts    0  PHQ-9 Score    15  Difficult doing work/chores    Somewhat difficult       No data to display                 Immunization History  Administered Date(s) Administered   Influenza Inj  Mdck Quad Pf 10/27/2017   Influenza, Seasonal, Injecte, Preservative Fre 09/17/2023   Influenza,inj,Quad PF,6+ Mos 09/28/2018, 09/13/2019, 08/28/2022   Influenza-Unspecified 10/08/2021   Pneumococcal Polysaccharide-23 04/03/1999   Td 06/09/2019   Tdap 03/25/2008   Zoster Recombinant(Shingrix) 05/12/2019, 09/27/2019     Past Medical History:  Diagnosis Date   Anemia 09/05/2018   Aplastic anemia (HCC) 11/26/2018   Chronic kidney disease    Complication of anesthesia    "I don't get knocked out w/sodium pentothal" (09/09/2018)   Depression    Depression, recurrent (HCC) 11/26/2018   Frequent UTI    GERD (gastroesophageal reflux disease)    Hiatal hernia    History of blood transfusion 08/2018; 09/2018   /notes10/01/2018   Hypertension    Leg weakness 11/04/2018   Renal cell carcinoma (HCC) 09/2018   kidney   Retroperitoneal hematoma    /notes10/01/2018   No Known Allergies Past Surgical History:  Procedure Laterality Date   AUGMENTATION MAMMAPLASTY Bilateral 2006   CESAREAN SECTION  1990; 1993   DILATION AND CURETTAGE OF UTERUS  1979   "to remove blood clot in my uterus"   IR ANGIO/SPINAL LEFT  09/14/2018   IR ANGIO/SPINAL LEFT  09/14/2018   IR ANGIOGRAM VISCERAL SELECTIVE  09/14/2018   IR ANGIOGRAM VISCERAL SELECTIVE  09/14/2018   IR FLUORO GUIDE CV LINE RIGHT  09/10/2018   IR RENAL SELECTIVE  UNI INC S&I MOD SED  09/14/2018   IR US GUIDE VASC ACCESS RIGHT  09/10/2018   IR US GUIDE VASC ACCESS RIGHT  09/14/2018   NEPHRECTOMY Left 09/22/2018   Procedure: NEPHRECTOMY;  Surgeon: Crista Elliot, MD;  Location: [facility];  Service: Urology;  Laterality: Left;  3 HRS   UMBILICAL HERNIA REPAIR  04/08/2007   Family History  Problem Relation Age of Onset   Heart disease Neg Hx    Colon cancer Neg Hx    Breast cancer Neg Hx    Esophageal cancer Neg Hx    Stomach cancer Neg Hx    Social History   Social History Narrative   Marital status/children/pets: married, 3 children.     Education/employment: Some college, [employer]   Safety:      -Wears a bicycle helmet riding a bike: Yes     -smoke alarm in the home:Yes     - wears seatbelt: Yes     - Feels safe in their relationships: Yes      Lives with husband   Caffeine use: 1 cup per day   Right handed     Allergies as of 09/17/2023   No Known Allergies      Medication List        Accurate as of September 17, 2023 10:43 AM. If you have any questions, ask your nurse or doctor.          levocetirizine 5 MG tablet Commonly known as: XYZAL Take 5 mg by mouth every evening.   MULTIVITAMIN ADULT PO Take 1 tablet by mouth daily.        All past medical history, surgical history, allergies, family history, immunizations andmedications were updated in the EMR today and reviewed under the history and medication portions of their EMR.     No results found for this or any previous visit (from the past 2160 hour(s)).  No results found.   ROS 14 pt review of systems performed and negative (unless mentioned in an HPI)  Objective: BP 118/71   Pulse (!) 57   Temp 98.1 F (36.7 C)   Ht 5' 1.81" (1.57 m)   Wt 129 lb 9.6 oz (58.8 kg)   SpO2 96%   BMI 23.85 kg/m  Physical Exam Vitals and nursing note reviewed.  Constitutional:      General: She is not in acute distress.    Appearance: Normal appearance. She is not ill-appearing or toxic-appearing.  HENT:     Head: Normocephalic and atraumatic.     Right Ear: Tympanic membrane, ear canal and external ear normal. There is no impacted cerumen.     Left Ear: Tympanic membrane, ear canal and external ear normal. There is no impacted cerumen.     Nose: No congestion or rhinorrhea.     Mouth/Throat:     Mouth: Mucous membranes are moist.     Pharynx: Oropharynx is clear. No oropharyngeal exudate or posterior oropharyngeal erythema.  Eyes:     General: No scleral icterus.       Right eye: No discharge.        Left eye: No discharge.      Extraocular Movements: Extraocular movements intact.     Conjunctiva/sclera: Conjunctivae normal.     Pupils: Pupils are equal, round, and reactive to light.  Cardiovascular:     Rate and Rhythm: Normal rate  and regular rhythm.     Pulses: Normal pulses.     Heart sounds: Normal heart sounds. No murmur heard.    No friction rub. No gallop.  Pulmonary:     Effort: Pulmonary effort is normal. No respiratory distress.     Breath sounds: Normal breath sounds. No stridor. No wheezing, rhonchi or rales.  Chest:     Chest wall: No tenderness.  Abdominal:     General: Abdomen is flat. Bowel sounds are normal. There is no distension.     Palpations: Abdomen is soft. There is no mass.     Tenderness: There is no abdominal tenderness. There is no right CVA tenderness, left CVA tenderness, guarding or rebound.     Hernia: No hernia is present.  Musculoskeletal:        General: No swelling, tenderness or deformity. Normal range of motion.     Cervical back: Normal range of motion and neck supple. No rigidity or tenderness.     Right lower leg: No edema.     Left lower leg: No edema.  Lymphadenopathy:     Cervical: No cervical adenopathy.  Skin:    General: Skin is warm and dry.     Coloration: Skin is not jaundiced or pale.     Findings: No bruising, erythema, lesion or rash.  Neurological:     General: No focal deficit present.     Mental Status: She is alert and oriented to person, place, and time. Mental status is at baseline.     Cranial Nerves: No cranial nerve deficit.     Sensory: No sensory deficit.     Motor: No weakness.     Coordination: Coordination normal.     Gait: Gait normal.     Deep Tendon Reflexes: Reflexes normal.  Psychiatric:        Mood and Affect: Mood normal.        Behavior: Behavior normal.        Thought Content: Thought content normal.        Judgment: Judgment normal.        No results found.  Assessment/plan: BRIANAH BAVA is a 62 y.o. female  present for CPE  Influenza vaccine needed - Flu vaccine trivalent PF, 6mos and older(Flulaval,Afluria,Fluarix,Fluzone) Aortic atherosclerosis (HCC)/Hyperlipidemia LDL goal <100/Statin declined/Calcification of coronary artery - Hemoglobin A1c - Lipid panel Diabetes mellitus screening - Hemoglobin A1c Breast cancer screening by mammogram - MM 3D SCREENING MAMMOGRAM BILATERAL BREAST; Future Estrogen deficiency - DG Bone Density; Future Routine general medical examination at a health care facility - Comprehensive metabolic panel - Hemoglobin A1c - TSH - CBC w/Diff Patient was encouraged to exercise greater than 150 minutes a week. Patient was encouraged to choose a diet filled with fresh fruits and vegetables, and lean meats. AVS provided to patient today for education/recommendation on gender specific health and safety maintenance. Colonoscopy: Cologuard completed 12/24/2022 Mammogram: Last mammogram 10/2021,order placed for [facility] on [address]  Cervical cancer screening: Last pap approximately 12/27/2018.  By PCP. Immunizations: Tetanus UTD 06/2019, influenza administered , Shingrix series completed Infectious disease screening: HIV and hep C completed. DEXA:ordered, same lx as mam  Return in about 1 year (around 09/17/2024) for cpe (20 min).  Orders Placed This Encounter  Procedures   MM 3D SCREENING MAMMOGRAM BILATERAL BREAST   DG Bone Density   Flu vaccine trivalent PF, 6mos and older(Flulaval,Afluria,Fluarix,Fluzone)   Comprehensive metabolic panel   Hemoglobin A1c   TSH  Lipid panel   CBC w/Diff   No orders of the defined types were placed in this encounter.  Referral Orders  No referral(s) requested today     Electronically signed by: Felix Pacini, DO [facility] [place]

## 2023-10-23 DIAGNOSIS — Z905 Acquired absence of kidney: Secondary | ICD-10-CM | POA: Diagnosis not present

## 2023-10-23 DIAGNOSIS — R918 Other nonspecific abnormal finding of lung field: Secondary | ICD-10-CM | POA: Diagnosis not present

## 2023-10-23 DIAGNOSIS — C642 Malignant neoplasm of left kidney, except renal pelvis: Secondary | ICD-10-CM | POA: Diagnosis not present

## 2023-10-23 DIAGNOSIS — N83201 Unspecified ovarian cyst, right side: Secondary | ICD-10-CM | POA: Diagnosis not present

## 2023-10-23 DIAGNOSIS — Z85528 Personal history of other malignant neoplasm of kidney: Secondary | ICD-10-CM | POA: Diagnosis not present

## 2023-12-10 DIAGNOSIS — K449 Diaphragmatic hernia without obstruction or gangrene: Secondary | ICD-10-CM

## 2023-12-10 HISTORY — DX: Diaphragmatic hernia without obstruction or gangrene: K44.9

## 2024-04-14 ENCOUNTER — Encounter: Payer: Self-pay | Admitting: Family Medicine

## 2024-04-14 ENCOUNTER — Ambulatory Visit (INDEPENDENT_AMBULATORY_CARE_PROVIDER_SITE_OTHER): Admitting: Family Medicine

## 2024-04-14 VITALS — BP 136/82 | HR 60 | Temp 98.3°F | Wt 133.4 lb

## 2024-04-14 DIAGNOSIS — Z7729 Contact with and (suspected ) exposure to other hazardous substances: Secondary | ICD-10-CM | POA: Diagnosis not present

## 2024-04-14 DIAGNOSIS — R9389 Abnormal findings on diagnostic imaging of other specified body structures: Secondary | ICD-10-CM | POA: Diagnosis not present

## 2024-04-14 DIAGNOSIS — R911 Solitary pulmonary nodule: Secondary | ICD-10-CM | POA: Diagnosis not present

## 2024-04-14 DIAGNOSIS — Z87891 Personal history of nicotine dependence: Secondary | ICD-10-CM | POA: Diagnosis not present

## 2024-04-14 MED ORDER — IPRATROPIUM BROMIDE 0.06 % NA SOLN: 2.0000 | Freq: Four times a day (QID) | NASAL | 12 refills | Status: AC

## 2024-04-14 NOTE — Progress Notes (Unsigned)
 Sharon Henson , 07/27/1961, 63 y.o., female MRN: [id number] Patient Care Team    Relationship Specialty Notifications Start End  Mariel Shope, DO PCP - General Family Medicine  10/30/18   Baron Border, MD Attending Physician Nephrology  11/02/18   Samson Croak, MD Consulting Physician Urology  11/02/18   Elois Hair, MD Consulting Physician Gastroenterology  09/17/23     Chief Complaint  Patient presents with   Review Imaging      Subjective: Sharon Henson is a 63 y.o. Pt presents for an OV with complaints of *** of *** duration.  Associated symptoms include ***.  Pt has tried *** to ease their symptoms.      04/14/2024    9:24 AM 10/15/2021   10:20 AM 09/27/2019    1:07 PM 04/28/2019    9:29 AM 10/30/2018   12:58 PM  Depression screen PHQ 2/9  Decreased Interest 0 0 0 0 3  Down, Depressed, Hopeless 0 0 0 0 3  PHQ - 2 Score 0 0 0 0 6  Altered sleeping 0    3  Tired, decreased energy 0    3  Change in appetite 0    3  Feeling bad or failure about yourself  0    0  Trouble concentrating 0    0  Moving slowly or fidgety/restless 0    0  Suicidal thoughts 0    0  PHQ-9 Score 0    15  Difficult doing work/chores Not difficult at all    Somewhat difficult    No Known Allergies Social History   Social History Narrative   Marital status/children/pets: married, 3 children.    Education/employment: Some college, [employer]   Safety:      -Wears a bicycle helmet riding a bike: Yes     -smoke alarm in the home:Yes     - wears seatbelt: Yes     - Feels safe in their relationships: Yes      Lives with husband   Caffeine use: 1 cup per day   Right handed    Past Medical History:  Diagnosis Date   Anemia 09/05/2018   Aplastic anemia (HCC) 11/26/2018   Chronic kidney disease    Complication of anesthesia    "I don't get knocked out w/sodium pentothal" (09/09/2018)   Depression    Depression, recurrent (HCC) 11/26/2018   Frequent UTI     GERD (gastroesophageal reflux disease)    Hiatal hernia    History of blood transfusion 08/2018; 09/2018   /notes10/01/2018   Hypertension    Leg weakness 11/04/2018   Renal cell carcinoma (HCC) 09/2018   kidney   Retroperitoneal hematoma    /notes10/01/2018   Past Surgical History:  Procedure Laterality Date   AUGMENTATION MAMMAPLASTY Bilateral 2006   CESAREAN SECTION  1990; 1993   DILATION AND CURETTAGE OF UTERUS  1979   "to remove blood clot in my uterus"   IR ANGIO/SPINAL LEFT  09/14/2018   IR ANGIO/SPINAL LEFT  09/14/2018   IR ANGIOGRAM VISCERAL SELECTIVE  09/14/2018   IR ANGIOGRAM VISCERAL SELECTIVE  09/14/2018   IR FLUORO GUIDE CV LINE RIGHT  09/10/2018   IR RENAL SELECTIVE  UNI INC S&I MOD SED  09/14/2018   IR US  GUIDE VASC ACCESS RIGHT  09/10/2018   IR US  GUIDE VASC ACCESS RIGHT  09/14/2018   NEPHRECTOMY Left 09/22/2018   Procedure: NEPHRECTOMY;  Surgeon: Parke Boll,  Willia Harries, MD;  Location: [facility];  Service: Urology;  Laterality: Left;  3 HRS   UMBILICAL HERNIA REPAIR  04/08/2007   Family History  Problem Relation Age of Onset   Heart disease Neg Hx    Colon cancer Neg Hx    Breast cancer Neg Hx    Esophageal cancer Neg Hx    Stomach cancer Neg Hx    Allergies as of 04/14/2024   No Known Allergies      Medication List        Accurate as of Apr 14, 2024  9:46 AM. If you have any questions, ask your nurse or doctor.          levocetirizine 5 MG tablet Commonly known as: XYZAL Take 5 mg by mouth every evening.   MULTIVITAMIN ADULT PO Take 1 tablet by mouth daily.        All past medical history, surgical history, allergies, family history, immunizations andmedications were updated in the EMR today and reviewed under the history and medication portions of their EMR.     ROS Negative, with the exception of above mentioned in HPI   Objective:  BP 136/82   Pulse 60   Temp 98.3 F (36.8 C)   Wt 133 lb 6.4 oz (60.5 kg)   SpO2 97%   BMI 24.55 kg/m   Body mass index is 24.55 kg/m.  Physical Exam   No results found. No results found. No results found for this or any previous visit (from the past 24 hours).  Assessment/Plan: Sharon Henson is a 63 y.o. female present for OV for  *** Bird breeder in the past   Reviewed expectations re: course of current medical issues. Discussed self-management of symptoms. Outlined signs and symptoms indicating need for more acute intervention. Patient verbalized understanding and all questions were answered. Patient received an After-Visit Summary.    No orders of the defined types were placed in this encounter.  No orders of the defined types were placed in this encounter.  Referral Orders  No referral(s) requested today     Note is dictated utilizing voice recognition software. Although note has been proof read prior to signing, occasional typographical errors still can be missed. If any questions arise, please do not hesitate to call for verification.   electronically signed by:  Napolean Backbone, DO  [facility] [place]

## 2024-04-15 DIAGNOSIS — R9389 Abnormal findings on diagnostic imaging of other specified body structures: Secondary | ICD-10-CM | POA: Insufficient documentation

## 2024-04-15 DIAGNOSIS — Z7729 Contact with and (suspected ) exposure to other hazardous substances: Secondary | ICD-10-CM | POA: Insufficient documentation

## 2024-04-15 DIAGNOSIS — R911 Solitary pulmonary nodule: Secondary | ICD-10-CM | POA: Insufficient documentation

## 2024-04-15 DIAGNOSIS — Z87891 Personal history of nicotine dependence: Secondary | ICD-10-CM | POA: Insufficient documentation

## 2024-06-08 DIAGNOSIS — R92333 Mammographic heterogeneous density, bilateral breasts: Secondary | ICD-10-CM | POA: Diagnosis not present

## 2024-06-08 DIAGNOSIS — Z1231 Encounter for screening mammogram for malignant neoplasm of breast: Secondary | ICD-10-CM | POA: Diagnosis not present

## 2024-06-08 LAB — HM MAMMOGRAPHY

## 2024-06-30 ENCOUNTER — Encounter: Payer: Self-pay | Admitting: Acute Care

## 2024-06-30 ENCOUNTER — Ambulatory Visit: Admitting: Acute Care

## 2024-06-30 VITALS — BP 136/62 | HR 50 | Temp 97.9°F | Ht 60.0 in | Wt 130.6 lb

## 2024-06-30 DIAGNOSIS — R918 Other nonspecific abnormal finding of lung field: Secondary | ICD-10-CM

## 2024-06-30 DIAGNOSIS — Z85528 Personal history of other malignant neoplasm of kidney: Secondary | ICD-10-CM | POA: Diagnosis not present

## 2024-06-30 DIAGNOSIS — Z87891 Personal history of nicotine dependence: Secondary | ICD-10-CM

## 2024-06-30 NOTE — Progress Notes (Signed)
 History of Present Illness Sharon Henson is a 63 y.o. female former smoker referred 06/2024 for pulmonary nodules on imaging. She will be followed by Dr. Shelah.  Pt. Has consented to use of [employer] soft wear to help capture the content of this OV    06/30/2024 Sharon Henson is a 63 year old female who presents with stable pulmonary nodules for evaluation.  She has stable pulmonary nodules that have remained unchanged in size for the past five years, with no changes  noted over the last eight months. She has a history of kidney cancer, for which she underwent a left nephrectomy, spleen removal, and tumor excision five years ago. During the surgery, she experienced significant complications, including nine blood transfusions. She has been released from her oncologist's care and is now managed by her primary care physician.  She has a history of asthma, which she attributes to exposure to bird feathers, particularly from a parrot she owned as an adult. Since removing the bird from her environment, she has not experienced asthma symptoms. She is a former smoker, having quit in 2000 after smoking intermittently since her teenage years, with a maximum of half a pack per day. She also has a history of MRSA colonization, discovered during a previous hospitalization.  She experiences chronic nasal congestion and a 'constantly dripping nose,' for which she uses ipratropium nasal spray three times a day. This medication is effective but can cause a slight headache. Over-the-counter medications have not provided relief. She has a history of nasal cauterization, which she believes has led to scar tissue and difficulty breathing through her right nostril.  She reports she has not had asthma for quite some time since removing the bird from her environment, and denies wheezing or coughing. She has not experienced any unintentional weight loss or hemoptysis. Her social history includes exposure to farm  animals and dust, as well as a history of living near a slaughterhouse in [place], in the past.   Test Results: 04/22/2024 CT Chest>> Imaging report from  [facility] Stable 7 mm ground glass pulmonary nodule in the superior segment of the right lower lobe. Scattered upper lobe predominant pulmonary nodules stable from prior exam., 4 mm in the RLL No new suspicious nodules , similar biapical scarring     Latest Ref Rng & Units 09/17/2023    9:55 AM 12/12/2022   11:06 AM 05/12/2019    9:35 AM  CBC  WBC 4.0 - 10.5 K/uL 6.7  6.8  5.1   Hemoglobin 12.0 - 15.0 g/dL 87.4  87.1  88.4   Hematocrit 36.0 - 46.0 % 38.8  39.1  34.7   Platelets 150.0 - 400.0 K/uL 188.0  223.0  193.0        Latest Ref Rng & Units 09/17/2023    9:55 AM 05/12/2019    9:35 AM 11/18/2018   12:00 AM  BMP  Glucose 70 - 99 mg/dL 86  899    BUN 6 - 23 mg/dL 20  21  16       Creatinine 0.40 - 1.20 mg/dL 9.22  9.03  9.13      Sodium 135 - 145 mEq/L 141  138  140      Potassium 3.5 - 5.1 mEq/L 4.0  5.0  4.8      Chloride 96 - 112 mEq/L 102  101  103      CO2 19 - 32 mEq/L 31  32  29  Calcium  8.4 - 10.5 mg/dL 9.8  9.8  89.5         This result is from an external source.    BNP No results found for: BNP  ProBNP No results found for: PROBNP  PFT No results found for: FEV1PRE, FEV1POST, FVCPRE, FVCPOST, TLC, DLCOUNC, PREFEV1FVCRT, PSTFEV1FVCRT  No results found.   Past medical hx Past Medical History:  Diagnosis Date   Anemia 09/05/2018   Aplastic anemia (HCC) 11/26/2018   Chronic kidney disease    Complication of anesthesia    I don't get knocked out w/sodium pentothal (09/09/2018)   Depression    Depression, recurrent (HCC) 11/26/2018   Frequent UTI    GERD (gastroesophageal reflux disease)    Hiatal hernia    History of blood transfusion 08/2018; 09/2018   /notes10/01/2018   Hypertension    Leg weakness 11/04/2018   Renal cell carcinoma (HCC) 09/2018   kidney    Retroperitoneal hematoma    /notes10/01/2018     Social History   Tobacco Use   Smoking status: Former    Current packs/day: 0.10    Average packs/day: 0.1 packs/day for 2.0 years (0.2 ttl pk-yrs)    Types: Cigarettes   Smokeless tobacco: Never   Tobacco comments:    06/30/2024 quit smoking in the 2000's  KRD  Vaping Use   Vaping status: Never Used  Substance Use Topics   Alcohol use: Not Currently    Comment: 09/09/2018 might have 1 drink/month   Drug use: Never    [name] reports that she has quit smoking. Her smoking use included cigarettes. She has a 0.2 pack-year smoking history. She has never used smokeless tobacco. She reports that she does not currently use alcohol. She reports that she does not use drugs.  Tobacco Cessation: Former smoker About a 20 pack year smoking history  Past surgical hx, Family hx, Social hx all reviewed.  Current Outpatient Medications on File Prior to Visit  Medication Sig   ipratropium (ATROVENT ) 0.06 % nasal spray Place 2 sprays into both nostrils 4 (four) times daily.   Multiple Vitamins-Minerals (MULTIVITAMIN ADULT PO) Take 1 tablet by mouth daily.   levocetirizine (XYZAL) 5 MG tablet Take 5 mg by mouth every evening. (Patient not taking: Reported on 06/30/2024)   No current facility-administered medications on file prior to visit.     Allergies  Allergen Reactions   Dust Mite Extract    Molds & Smuts     Review Of Systems:  Constitutional:   No  weight loss, night sweats,  Fevers, chills, fatigue, or  lassitude.  HEENT:   No headaches,  Difficulty swallowing,  Tooth/dental problems, or  Sore throat,                No sneezing, itching, ear ache, nasal congestion, post nasal drip, +  chronic runny nose  CV:  No chest pain,  Orthopnea, PND, swelling in lower extremities, anasarca, dizziness, palpitations, syncope.   GI  No heartburn, indigestion, abdominal pain, nausea, vomiting, diarrhea, change in bowel habits, loss of  appetite, bloody stools.   Resp: No shortness of breath with exertion or at rest.  No excess mucus, no productive cough,  No non-productive cough,  No coughing up of blood.  No change in color of mucus.  No wheezing.  No chest wall deformity  Skin: no rash or lesions.  GU: no dysuria, change in color of urine, no urgency or frequency.  No flank pain, no hematuria  MS:  No joint pain or swelling.  No decreased range of motion.  No back pain.  Psych:  No change in mood or affect. No depression or anxiety.  No memory loss.   Vital Signs BP 136/62 (BP Location: Left Arm, Patient Position: Sitting, Cuff Size: Normal)   Pulse (!) 50   Temp 97.9 F (36.6 C) (Oral)   Ht 5' (1.524 m)   Wt 130 lb 9.6 oz (59.2 kg)   SpO2 98%   BMI 25.51 kg/m    Physical Exam:  General- No distress,  A&Ox3, pleasant ENT: No sinus tenderness, TM clear, pale nasal mucosa, no oral exudate,no post nasal drip, no LAN, chronic runny nose Cardiac: S1, S2, regular rate and rhythm, no murmur Chest: No wheeze/ rales/ dullness; no accessory muscle use, no nasal flaring, no sternal retractions Abd.: Soft Non-tender, ND, BS +, Body mass index is 25.51 kg/m.  Ext: No clubbing cyanosis, edema, no obvious deformities Neuro:  normal strength, MAE x 4, A&O x 3, appropriate Skin: No rashes, warm and dry, NO obvious skin lesions  Psych: normal mood and behavior   Assessment/Plan Stable Pulmonary nodules Former smoker  History or left renal carcinoma, >> left nephrectomy for treatment 2020 Plan It is good to see you today. We will do a CT chest now.  You will get a call to get this scheduled. You will follow up with me after the scan to review the results. You will get  call to schedule follow up.  Continue ipratropium (ATROVENT ) 0.06 % nasal spray as you have been doing. Please contact office for sooner follow up if symptoms do not improve or worsen or seek emergency care    I spent 30 minutes dedicated to the  care of this patient on the date of this encounter to include pre-visit review of records, face-to-face time with the patient discussing conditions above, post visit ordering of testing, clinical documentation with the electronic health record, making appropriate referrals as documented, and communicating necessary information to the patient's healthcare team.       Lauraine JULIANNA Lites, NP 06/30/2024  10:07 AM

## 2024-06-30 NOTE — Progress Notes (Signed)
 I agree with the plans as outlined above.   Lamar Chris, MD, PhD 06/30/2024, 3:59 PM  [facility] [phone] or if no answer before 7:00PM call [phone] For any issues after 7:00PM please call eLink [phone]

## 2024-06-30 NOTE — Patient Instructions (Addendum)
 It is good to see you today. We will do a CT chest now.  You will get a call to get this scheduled. You will follow up with me after the scan to review the results. You will get  call to schedule follow up.  Continue ipratropium (ATROVENT ) 0.06 % nasal spray as you have been doing. Please contact office for sooner follow up if symptoms do not improve or worsen or seek emergency care

## 2024-07-05 ENCOUNTER — Ambulatory Visit

## 2024-07-05 DIAGNOSIS — R918 Other nonspecific abnormal finding of lung field: Secondary | ICD-10-CM

## 2024-07-05 DIAGNOSIS — I700 Atherosclerosis of aorta: Secondary | ICD-10-CM | POA: Diagnosis not present

## 2024-07-05 DIAGNOSIS — K449 Diaphragmatic hernia without obstruction or gangrene: Secondary | ICD-10-CM | POA: Diagnosis not present

## 2024-07-14 DIAGNOSIS — Z135 Encounter for screening for eye and ear disorders: Secondary | ICD-10-CM | POA: Diagnosis not present

## 2024-07-14 DIAGNOSIS — H524 Presbyopia: Secondary | ICD-10-CM | POA: Diagnosis not present

## 2024-07-14 DIAGNOSIS — H5213 Myopia, bilateral: Secondary | ICD-10-CM | POA: Diagnosis not present

## 2024-07-14 DIAGNOSIS — H25813 Combined forms of age-related cataract, bilateral: Secondary | ICD-10-CM | POA: Diagnosis not present

## 2024-07-27 ENCOUNTER — Encounter: Payer: Self-pay | Admitting: Acute Care

## 2024-07-27 ENCOUNTER — Ambulatory Visit: Admitting: Acute Care

## 2024-07-27 VITALS — BP 149/79 | HR 55 | Ht 60.0 in | Wt 133.0 lb

## 2024-07-27 DIAGNOSIS — Z85528 Personal history of other malignant neoplasm of kidney: Secondary | ICD-10-CM

## 2024-07-27 DIAGNOSIS — R918 Other nonspecific abnormal finding of lung field: Secondary | ICD-10-CM

## 2024-07-27 DIAGNOSIS — Z87891 Personal history of nicotine dependence: Secondary | ICD-10-CM

## 2024-07-27 DIAGNOSIS — R9389 Abnormal findings on diagnostic imaging of other specified body structures: Secondary | ICD-10-CM

## 2024-07-27 NOTE — Progress Notes (Signed)
 History of Present Illness Sharon Henson is a 63 y.o. female  former smoker with history of renal cell carcinoma and left nephrectomy, asthma, and MRSA colonization,  referred 06/2024 for pulmonary nodules on imaging. She will be followed by Dr. Shelah    07/27/2024 Discussed the use of AI scribe software for clinical note transcription with the patient, who gave verbal consent to proceed.  History of Present Illness Sharon Henson is a 63 year old female former smoker with history of renal cell carcinoma and stable bilateral pulmonary nodules who presents for follow-up evaluation.   She has a history of stable bilateral pulmonary nodules, with the largest being a 7 mm nodule in the right lower lobe. Other nodules include a 5 mm nodule in the left lower lobe, a 4 mm nodule in the left upper lobe, and a 3 mm nodule in the right upper lobe. These nodules have remained stable over the past five years.  We discussed whether she has a family history of autoimmune diseases. She states her grandfather had psoriasis . She has a history of a skin rash that she feels is related to laundry detergent. We did discuss that sarcoidosis is part of the differential. We discussed how sarcoidosis can affect the skin, eyes, lungs and heart. She is totally asymptomatic. She is going to let me know if the rash persists after changing laundry detergents. She does get annual eye exams. We discussed whether she would like to have a 2 D Echo done to evaluate heart function. She currently does not wish to do so. I have told her to call me if she changes her mind, and I will place the order. We discussed coming in to be evaluated if she has any dyspnea, or develops a dry, non productive cough so we can re-evaluate her at that time for a sarcoid flare.    She has a history of several episodes of pneumonia in the past. She has a smoking history, which is relevant to the monitoring of her pulmonary nodules. No  unexplained weight loss or hemoptysis.  She has astigmatism and cataracts, but her eyesight has not changed in the past six to seven years. She undergoes annual eye exams where her eyes are dilated for a thorough check.  She mentions a family history of heart issues, with both parents having undergone open heart surgery. She has had cardiac evaluations, including a cardiac CT, which were normal. She sees a cardiologist annually due to her family history.  Due to her renal cell carcinoma and her smoking history, we will do an annual CT Chest to maintain surveillance of her pulmonary lung nodules. I have asked her to call to be seen sooner for any unexplained weight loss, blood in her sputum, or dry non productive cough that lingers.      Test Results: CT Chest 07/05/2024 Mediastinum/Nodes: No enlarged axillary, mediastinal or hilar lymphadenopathy. Small hiatal hernia.   Lungs/Pleura: Central airways are patent. No large area pulmonary consolidation. Minimal dependent atelectasis within the lower lobes bilaterally. Stable 5 mm left lower lobe nodule, compatible with benign process given stability over time. Stable 4 mm left upper lobe pulmonary nodule. Stable 3 mm right upper lobe pulmonary nodule (image 22; series 3). Similar to mild interval decrease in size of 3 mm subpleural right lower lobe nodule (image 57; series 3). Stable 7 mm ground-glass nodule superior segment right lower lobe (image 51; series 3). No pleural effusion or pneumothorax.   Upper  Abdomen: Postsurgical changes compatible with left nephrectomy. No acute process within the upper abdomen.   Musculoskeletal: No aggressive or acute appearing osseous lesions.   IMPRESSION: 1. Stable bilateral pulmonary nodules. The largest nodule measures 7 mm in the superior segment right lower lobe. Recommend follow-up chest CT in 12 months. 2. Aortic atherosclerosis.    04/22/2024 CT Chest>> Imaging report from  [facility] Stable 7 mm ground glass pulmonary nodule in the superior segment of the right lower lobe. Scattered upper lobe predominant pulmonary nodules stable from prior exam., 4 mm in the RLL No new suspicious nodules , similar biapical scarring    Latest Ref Rng & Units 09/17/2023    9:55 AM 12/12/2022   11:06 AM 05/12/2019    9:35 AM  CBC  WBC 4.0 - 10.5 K/uL 6.7  6.8  5.1   Hemoglobin 12.0 - 15.0 g/dL 87.4  87.1  88.4   Hematocrit 36.0 - 46.0 % 38.8  39.1  34.7   Platelets 150.0 - 400.0 K/uL 188.0  223.0  193.0        Latest Ref Rng & Units 09/17/2023    9:55 AM 05/12/2019    9:35 AM 11/18/2018   12:00 AM  BMP  Glucose 70 - 99 mg/dL 86  899    BUN 6 - 23 mg/dL 20  21  16       Creatinine 0.40 - 1.20 mg/dL 9.22  9.03  9.13      Sodium 135 - 145 mEq/L 141  138  140      Potassium 3.5 - 5.1 mEq/L 4.0  5.0  4.8      Chloride 96 - 112 mEq/L 102  101  103      CO2 19 - 32 mEq/L 31  32  29      Calcium  8.4 - 10.5 mg/dL 9.8  9.8  89.5         This result is from an external source.    BNP No results found for: BNP  ProBNP No results found for: PROBNP  PFT No results found for: FEV1PRE, FEV1POST, FVCPRE, FVCPOST, TLC, DLCOUNC, PREFEV1FVCRT, PSTFEV1FVCRT  CT CHEST WO CONTRAST Result Date: 07/10/2024 CLINICAL DATA:  Follow-up pulmonary nodules. History of renal cell carcinoma. * Tracking Code: BO * EXAM: CT CHEST WITHOUT CONTRAST TECHNIQUE: Multidetector CT imaging of the chest was performed following the standard protocol without IV contrast. RADIATION DOSE REDUCTION: This exam was performed according to the departmental dose-optimization program which includes automated exposure control, adjustment of the mA and/or kV according to patient size and/or use of iterative reconstruction technique. COMPARISON:  CT CAP 10/23/2023 FINDINGS: Cardiovascular: Normal heart size. Thoracic aortic coronary arterial vascular calcifications. Mediastinum/Nodes: No enlarged axillary,  mediastinal or hilar lymphadenopathy. Small hiatal hernia. Lungs/Pleura: Central airways are patent. No large area pulmonary consolidation. Minimal dependent atelectasis within the lower lobes bilaterally. Stable 5 mm left lower lobe nodule, compatible with benign process given stability over time. Stable 4 mm left upper lobe pulmonary nodule. Stable 3 mm right upper lobe pulmonary nodule (image 22; series 3). Similar to mild interval decrease in size of 3 mm subpleural right lower lobe nodule (image 57; series 3). Stable 7 mm ground-glass nodule superior segment right lower lobe (image 51; series 3). No pleural effusion or pneumothorax. Upper Abdomen: Postsurgical changes compatible with left nephrectomy. No acute process within the upper abdomen. Musculoskeletal: No aggressive or acute appearing osseous lesions. IMPRESSION: 1. Stable bilateral pulmonary nodules. The largest nodule measures  7 mm in the superior segment right lower lobe. Recommend follow-up chest CT in 12 months. 2. Aortic atherosclerosis. Electronically Signed   By: Bard Moats M.D.   On: 07/10/2024 22:50     Past medical hx Past Medical History:  Diagnosis Date   Anemia 09/05/2018   Aplastic anemia (HCC) 11/26/2018   Chronic kidney disease    Complication of anesthesia    I don't get knocked out w/sodium pentothal (09/09/2018)   Depression    Depression, recurrent (HCC) 11/26/2018   Frequent UTI    GERD (gastroesophageal reflux disease)    Hiatal hernia    History of blood transfusion 08/2018; 09/2018   /notes10/01/2018   Hypertension    Leg weakness 11/04/2018   Renal cell carcinoma (HCC) 09/2018   kidney   Retroperitoneal hematoma    /notes10/01/2018     Social History   Tobacco Use   Smoking status: Former    Current packs/day: 0.10    Average packs/day: 0.1 packs/day for 2.0 years (0.2 ttl pk-yrs)    Types: Cigarettes   Smokeless tobacco: Never   Tobacco comments:    06/30/2024 quit smoking in the 2000's  KRD   Vaping Use   Vaping status: Never Used  Substance Use Topics   Alcohol use: Not Currently    Comment: 09/09/2018 might have 1 drink/month   Drug use: Never    Ms.Holderman reports that she has quit smoking. Her smoking use included cigarettes. She has a 0.2 pack-year smoking history. She has never used smokeless tobacco. She reports that she does not currently use alcohol. She reports that she does not use drugs.  Tobacco Cessation: Counseling given: Not Answered Tobacco comments: 06/30/2024 quit smoking in the 2000's  KRD Former smoker   Past surgical hx, Family hx, Social hx all reviewed.  Current Outpatient Medications on File Prior to Visit  Medication Sig   ipratropium (ATROVENT ) 0.06 % nasal spray Place 2 sprays into both nostrils 4 (four) times daily.   levocetirizine (XYZAL) 5 MG tablet Take 5 mg by mouth every evening.   Multiple Vitamins-Minerals (MULTIVITAMIN ADULT PO) Take 1 tablet by mouth daily.   amoxicillin (AMOXIL) 500 MG tablet Take 500 mg by mouth 3 (three) times daily. (Patient not taking: Reported on 07/27/2024)   No current facility-administered medications on file prior to visit.     Allergies  Allergen Reactions   Dust Mite Extract    Molds & Smuts     Review Of Systems:  Constitutional:   No  weight loss, night sweats,  Fevers, chills, fatigue, or  lassitude.  HEENT:   No headaches,  Difficulty swallowing,  Tooth/dental problems, or  Sore throat,                No sneezing, itching, ear ache, + nasal congestion, post nasal drip,   CV:  No chest pain,  Orthopnea, PND, swelling in lower extremities, anasarca, dizziness, palpitations, syncope.   GI  No heartburn, indigestion, abdominal pain, nausea, vomiting, diarrhea, change in bowel habits, loss of appetite, bloody stools.   Resp: No shortness of breath with exertion or at rest.  No excess mucus, no productive cough,  No non-productive cough,  No coughing up of blood.  No change in color of mucus.   No wheezing.  No chest wall deformity  Skin: + skin  rash to right hip area, no other  lesions.  GU: no dysuria, change in color of urine, no urgency or frequency.  No flank  pain, no hematuria   MS:  No joint pain or swelling.  No decreased range of motion.  No back pain.  Psych:  No change in mood or affect. No depression or anxiety.  No memory loss.   Vital Signs BP (!) 149/79   Pulse (!) 55   Ht 5' (1.524 m)   Wt 133 lb (60.3 kg)   SpO2 97%   BMI 25.97 kg/m    Physical Exam:  General- No distress,  A&Ox3, pleasant ENT: No sinus tenderness, TM clear, pale nasal mucosa, no oral exudate,no post nasal drip, no LAN, + nasal congestion Cardiac: S1, S2, regular rate and rhythm, no murmur Chest: No wheeze/ rales/ dullness; no accessory muscle use, no nasal flaring, no sternal retractions Abd.: Soft Non-tender, ND, BS +, Body mass index is 25.97 kg/m.  Ext: No clubbing cyanosis, edema, no obvious deformities Neuro:  normal strength, MAE x 4, A&O x 3 Skin: No rashes, warm and dry, no obvious skin lesions  Psych: normal mood and behavior    Assessment & Plan Stable bilateral pulmonary nodules Multiple stable pulmonary nodules identified, largest 7 mm in right lower lobe. No malignancy or significant growth observed over five years. Differential includes benign processes and potential sarcoidosis. History of renal cell carcinoma  Former smoker  - Continue annual CT scans to monitor nodule stability. - Advise immediate return if experiencing unexplained weight loss or hemoptysis.  Suspected pulmonary sarcoidosis Suspected sarcoidosis due to stable pulmonary nodules. No biopsy planned. History of eye exams with no vision changes, no cardiac symptoms warranting further investigation. - Ensure annual eye exams and inform ophthalmologist about lung nodules and potential sarcoidosis. - Discuss option of referral to cardiologist for 2D echocardiogram if desired.  History of tobacco  use Tobacco use increases risk for pulmonary nodules and other respiratory conditions. Plan CT Chest follow up in 1 year Call to be seen sooner for unexplained weight loss or hemoptysis   I spent 30 minutes dedicated to the care of this patient on the date of this encounter to include pre-visit review of records, face-to-face time with the patient discussing conditions above, post visit ordering of testing, clinical documentation with the electronic health record, making appropriate referrals as documented, and communicating necessary information to the patient's healthcare team.        Lauraine JULIANNA Lites, NP 07/27/2024  10:01 AM

## 2024-07-27 NOTE — Patient Instructions (Signed)
 It is good to see you today. We have reviewed your CT Chest . The nodules we have seen are stable, which is great news. We will do an annual scan to keep an eye on these.  This will be due 07/2025 These may be sarcoid, but we would need to biopsy them to be sure. This is an auto immune disease, that can affect the skin, eyes, heart and lungs. Call if you want a referral for cardiology in the future. Call for any blood in your sputum, or unexplained weight loss.  Call if you need us .  Please contact office for sooner follow up if symptoms do not improve or worsen or seek emergency care

## 2024-07-30 ENCOUNTER — Ambulatory Visit: Admitting: Acute Care

## 2024-07-30 NOTE — Progress Notes (Signed)
 I agree with the plans as outlined above.   Lamar Chris, MD, PhD 07/30/2024, 6:53 PM [facility] [phone] or if no answer before 7:00PM call [phone] For any issues after 7:00PM please call eLink [phone]

## 2024-09-22 ENCOUNTER — Encounter: Payer: Self-pay | Admitting: Family Medicine

## 2024-09-22 ENCOUNTER — Ambulatory Visit: Payer: 59 | Admitting: Family Medicine

## 2024-09-22 VITALS — BP 124/76 | HR 65 | Temp 98.1°F | Wt 132.0 lb

## 2024-09-22 DIAGNOSIS — Z23 Encounter for immunization: Secondary | ICD-10-CM | POA: Diagnosis not present

## 2024-09-22 DIAGNOSIS — I700 Atherosclerosis of aorta: Secondary | ICD-10-CM | POA: Diagnosis not present

## 2024-09-22 DIAGNOSIS — E785 Hyperlipidemia, unspecified: Secondary | ICD-10-CM

## 2024-09-22 DIAGNOSIS — Z905 Acquired absence of kidney: Secondary | ICD-10-CM

## 2024-09-22 DIAGNOSIS — E2839 Other primary ovarian failure: Secondary | ICD-10-CM

## 2024-09-22 DIAGNOSIS — Z0000 Encounter for general adult medical examination without abnormal findings: Secondary | ICD-10-CM | POA: Diagnosis not present

## 2024-09-22 DIAGNOSIS — Z1231 Encounter for screening mammogram for malignant neoplasm of breast: Secondary | ICD-10-CM

## 2024-09-22 DIAGNOSIS — Z87891 Personal history of nicotine dependence: Secondary | ICD-10-CM

## 2024-09-22 DIAGNOSIS — R911 Solitary pulmonary nodule: Secondary | ICD-10-CM

## 2024-09-22 DIAGNOSIS — I2510 Atherosclerotic heart disease of native coronary artery without angina pectoris: Secondary | ICD-10-CM | POA: Diagnosis not present

## 2024-09-22 DIAGNOSIS — Z131 Encounter for screening for diabetes mellitus: Secondary | ICD-10-CM | POA: Diagnosis not present

## 2024-09-22 LAB — COMPREHENSIVE METABOLIC PANEL WITH GFR
ALT: 12 U/L (ref 0–35)
AST: 21 U/L (ref 0–37)
Albumin: 5.1 g/dL (ref 3.5–5.2)
Alkaline Phosphatase: 79 U/L (ref 39–117)
BUN: 20 mg/dL (ref 6–23)
CO2: 32 meq/L (ref 19–32)
Calcium: 10.3 mg/dL (ref 8.4–10.5)
Chloride: 98 meq/L (ref 96–112)
Creatinine, Ser: 1 mg/dL (ref 0.40–1.20)
GFR: 59.03 mL/min — ABNORMAL LOW (ref >60.00)
Glucose, Bld: 100 mg/dL — ABNORMAL HIGH (ref 70–99)
Potassium: 5.1 meq/L (ref 3.5–5.1)
Sodium: 138 meq/L (ref 135–145)
Total Bilirubin: 0.7 mg/dL (ref 0.2–1.2)
Total Protein: 7.9 g/dL (ref 6.0–8.3)

## 2024-09-22 LAB — CBC
HCT: 39.3 % (ref 36.0–46.0)
Hemoglobin: 12.8 g/dL (ref 12.0–15.0)
MCHC: 32.6 g/dL (ref 30.0–36.0)
MCV: 86.8 fl (ref 78.0–100.0)
Platelets: 185 K/uL (ref 150.0–400.0)
RBC: 4.53 Mil/uL (ref 3.87–5.11)
RDW: 14.3 % (ref 11.5–15.5)
WBC: 7.8 K/uL (ref 4.0–10.5)

## 2024-09-22 LAB — LIPID PANEL
Cholesterol: 241 mg/dL — ABNORMAL HIGH (ref 0–200)
HDL: 58.9 mg/dL (ref >39.00)
LDL Cholesterol: 157 mg/dL — ABNORMAL HIGH (ref 0–99)
NonHDL: 181.68
Total CHOL/HDL Ratio: 4
Triglycerides: 122 mg/dL (ref 0.0–149.0)
VLDL: 24.4 mg/dL (ref 0.0–40.0)

## 2024-09-22 LAB — HEMOGLOBIN A1C: Hgb A1c MFr Bld: 6 % (ref 4.6–6.5)

## 2024-09-22 LAB — TSH: TSH: 3.8 u[IU]/mL (ref 0.35–5.50)

## 2024-09-22 NOTE — Progress Notes (Signed)
 Patient ID: Sharon Henson, female  DOB: Nov 13, 1961, 63 y.o.   MRN: [id number] Patient Care Team    Relationship Specialty Notifications Start End  Catherine Charlies LABOR, DO PCP - General Family Medicine  10/30/18   Norine Manuelita LABOR, MD Attending Physician Nephrology  11/02/18   Sharon Sherwood JONETTA DOUGLAS, MD Consulting Physician Urology  11/02/18   Stacia Glendia BRAVO, MD Consulting Physician Gastroenterology  09/17/23   Ruthell Lauraine FALCON, NP Nurse Practitioner Pulmonary Disease  09/22/24   Arloa Redell Mt, DO  Ophthalmology  09/22/24     Chief Complaint  Patient presents with   Annual Exam    Pt is fasting.  Influenza vac- given Prevnar 20 vac- declined    Subjective:  Sharon Henson is a 63 y.o.  Female  present for CPE . All past medical history, surgical history, allergies, family history, immunizations, medications and social history were updated in the electronic medical record today. All recent labs, ED visits and hospitalizations within the last year were reviewed.  Health maintenance:  Colonoscopy: Cologuard completed 12/24/2022- rpt 3 yrs Mammogram: Last mammogram 06/08/2024, [facility] on [address]  Cervical cancer screening: Last pap approximately 12/27/2018.  By PCP.-declined today Immunizations: Tetanus UTD 06/2019, influenza administered -given , Shingrix  series completed, prevnar 20 -declined today Infectious disease screening: HIV and hep C completed. DEXA:ordered again, same lx as mam Patient has a Dental home. Hospitalizations/ED visits: reviewed      04/14/2024    9:24 AM 10/15/2021   10:20 AM 09/27/2019    1:07 PM 04/28/2019    9:29 AM 10/30/2018   12:58 PM  Depression screen PHQ 2/9  Decreased Interest 0 0 0 0 3  Down, Depressed, Hopeless 0 0 0 0 3  PHQ - 2 Score 0 0 0 0 6  Altered sleeping 0    3  Tired, decreased energy 0    3  Change in appetite 0    3  Feeling bad or failure about yourself  0    0  Trouble concentrating 0    0   Moving slowly or fidgety/restless 0    0  Suicidal thoughts 0    0  PHQ-9 Score 0    15  Difficult doing work/chores Not difficult at all    Somewhat difficult      04/14/2024    9:24 AM  GAD 7 : Generalized Anxiety Score  Nervous, Anxious, on Edge 0  Control/stop worrying 0  Worry too much - different things 0  Trouble relaxing 0  Restless 0  Easily annoyed or irritable 0  Afraid - awful might happen 0  Total GAD 7 Score 0  Anxiety Difficulty Not difficult at all       09/27/2018    9:47 AM 09/27/2018    8:24 PM 09/28/2018    8:54 AM 04/28/2019    9:29 AM 04/14/2024    9:24 AM  Fall Risk  Falls in the past year?    0  0  Was there an injury with Fall?     0  Fall Risk Category Calculator     0  (RETIRED) Patient Fall Risk Level Moderate fall risk  Moderate fall risk  Moderate fall risk     Patient at Risk for Falls Due to     No Fall Risks  Fall risk Follow up    Falls evaluation completed  Falls evaluation completed     Data  saved with a previous flowsheet row definition          Immunization History  Administered Date(s) Administered   INFLUENZA, HIGH DOSE SEASONAL PF 09/22/2024   Influenza Inj Mdck Quad Pf 10/27/2017   Influenza, Seasonal, Injecte, Preservative Fre 09/17/2023   Influenza,inj,Quad PF,6+ Mos 09/28/2018, 09/13/2019, 08/28/2022   Influenza-Unspecified 10/08/2021   Moderna Sars-Covid-2 Vaccination 03/01/2020, 03/29/2020   PFIZER(Purple Top)SARS-COV-2 Vaccination 11/20/2020   Pneumococcal Polysaccharide-23 04/03/1999   Td 06/09/2019   Tdap 03/25/2008   Zoster Recombinant(Shingrix ) 05/12/2019, 09/27/2019     Past Medical History:  Diagnosis Date   Anemia 09/05/2018   Aplastic anemia 11/26/2018   Chronic kidney disease    Complication of anesthesia    I don't get knocked out w/sodium pentothal (09/09/2018)   Depression    Depression, recurrent 11/26/2018   Frequent UTI    GERD (gastroesophageal reflux disease)    Hiatal hernia    Hiatal  hernia 2025   small on CT   History of blood transfusion 08/2018; 09/2018   /notes10/01/2018   Hypertension    Leg weakness 11/04/2018   Pulmonary hypertension, primary (HCC) 11/26/2018   Renal cell carcinoma (HCC) 09/2018   kidney   Retroperitoneal hematoma    /notes10/01/2018   Allergies  Allergen Reactions   Dust Mite Extract    Molds & Smuts    Past Surgical History:  Procedure Laterality Date   AUGMENTATION MAMMAPLASTY Bilateral 2006   CESAREAN SECTION  1990; 1993   DILATION AND CURETTAGE OF UTERUS  1979   to remove blood clot in my uterus   IR ANGIO/SPINAL LEFT  09/14/2018   IR ANGIO/SPINAL LEFT  09/14/2018   IR ANGIOGRAM VISCERAL SELECTIVE  09/14/2018   IR ANGIOGRAM VISCERAL SELECTIVE  09/14/2018   IR FLUORO GUIDE CV LINE RIGHT  09/10/2018   IR RENAL SELECTIVE  UNI INC S&I MOD SED  09/14/2018   IR US  GUIDE VASC ACCESS RIGHT  09/10/2018   IR US  GUIDE VASC ACCESS RIGHT  09/14/2018   NEPHRECTOMY Left 09/22/2018   Procedure: NEPHRECTOMY;  Surgeon: Sharon Sherwood JONETTA DOUGLAS, MD;  Location: [facility];  Service: Urology;  Laterality: Left;  3 HRS   UMBILICAL HERNIA REPAIR  04/08/2007   Family History  Problem Relation Age of Onset   Heart disease Neg Hx    Colon cancer Neg Hx    Breast cancer Neg Hx    Esophageal cancer Neg Hx    Stomach cancer Neg Hx    Social History   Social History Narrative   Marital status/children/pets: married, 3 children.    Education/employment: Some college, [employer]   Safety:      -Wears a bicycle helmet riding a bike: Yes     -smoke alarm in the home:Yes     - wears seatbelt: Yes     - Feels safe in their relationships: Yes      Lives with husband   Caffeine use: 1 cup per day   Right handed     Allergies as of 09/22/2024       Reactions   Dust Mite Extract    Molds & Smuts         Medication List        Accurate as of September 22, 2024 10:08 AM. If you have any questions, ask your nurse or doctor.          STOP taking these  medications    amoxicillin 500 MG tablet Commonly known as: AMOXIL Stopped  by: Charlies Bellini       TAKE these medications    ipratropium 0.06 % nasal spray Commonly known as: ATROVENT  Place 2 sprays into both nostrils 4 (four) times daily.   levocetirizine 5 MG tablet Commonly known as: XYZAL Take 5 mg by mouth every evening.   MULTIVITAMIN ADULT PO Take 1 tablet by mouth daily.        All past medical history, surgical history, allergies, family history, immunizations andmedications were updated in the EMR today and reviewed under the history and medication portions of their EMR.     No results found for this or any previous visit (from the past 2160 hours).  No results found.   Review of Systems  All other systems reviewed and are negative.  14 pt review of systems performed and negative (unless mentioned in an HPI)  Objective: BP 124/76   Pulse 65   Temp 98.1 F (36.7 C)   Wt 132 lb (59.9 kg)   SpO2 98%   BMI 25.78 kg/m  Physical Exam Vitals and nursing note reviewed.  Constitutional:      General: She is not in acute distress.    Appearance: Normal appearance. She is not ill-appearing or toxic-appearing.  HENT:     Head: Normocephalic and atraumatic.     Right Ear: Tympanic membrane, ear canal and external ear normal. There is no impacted cerumen.     Left Ear: Tympanic membrane, ear canal and external ear normal. There is no impacted cerumen.     Nose: No congestion or rhinorrhea.     Mouth/Throat:     Mouth: Mucous membranes are moist.     Pharynx: Oropharynx is clear. No oropharyngeal exudate or posterior oropharyngeal erythema.  Eyes:     General: No scleral icterus.       Right eye: No discharge.        Left eye: No discharge.     Extraocular Movements: Extraocular movements intact.     Conjunctiva/sclera: Conjunctivae normal.     Pupils: Pupils are equal, round, and reactive to light.  Cardiovascular:     Rate and Rhythm: Normal rate and  regular rhythm.     Pulses: Normal pulses.     Heart sounds: Normal heart sounds. No murmur heard.    No friction rub. No gallop.  Pulmonary:     Effort: Pulmonary effort is normal. No respiratory distress.     Breath sounds: Normal breath sounds. No stridor. No wheezing, rhonchi or rales.  Chest:     Chest wall: No tenderness.  Abdominal:     General: Abdomen is flat. Bowel sounds are normal. There is no distension.     Palpations: Abdomen is soft. There is no mass.     Tenderness: There is no abdominal tenderness. There is no right CVA tenderness, left CVA tenderness, guarding or rebound.     Hernia: No hernia is present.  Musculoskeletal:        General: No swelling, tenderness or deformity. Normal range of motion.     Cervical back: Normal range of motion and neck supple. No rigidity or tenderness.     Right lower leg: No edema.     Left lower leg: No edema.  Lymphadenopathy:     Cervical: No cervical adenopathy.  Skin:    General: Skin is warm and dry.     Coloration: Skin is not jaundiced or pale.     Findings: No bruising, erythema, lesion or rash.  Neurological:  General: No focal deficit present.     Mental Status: She is alert and oriented to person, place, and time. Mental status is at baseline.     Cranial Nerves: No cranial nerve deficit.     Sensory: No sensory deficit.     Motor: No weakness.     Coordination: Coordination normal.     Gait: Gait normal.     Deep Tendon Reflexes: Reflexes normal.  Psychiatric:        Mood and Affect: Mood normal.        Behavior: Behavior normal.        Thought Content: Thought content normal.        Judgment: Judgment normal.        No results found.  Assessment/plan: SAMEENA ARTUS is a 63 y.o. female present for CPE  Aortic atherosclerosis (HCC)/Hyperlipidemia LDL goal <100/Statin declined/Calcification of coronary artery - Hemoglobin A1c - Lipid panel  Former smoker/Estrogen deficiency - DG bone Density;  Future  Solitary kidney, acquired/ h/o RCC > 5 yr cleared.   Influenza vaccine needed - Flu vaccine HIGH DOSE PF(Fluzone Trivalent) Need for vaccination for pneumococcus Declined today Breast cancer screening by mammogram - MM 3D SCREENING MAMMOGRAM BILATERAL BREAST; Future  Pulmonary nodule Following with pulm  Routine general medical examination at a health care facility (Primary) Colonoscopy: Cologuard completed 12/24/2022- rpt 3 yrs Mammogram: Last mammogram 06/08/2024, [facility] on [address]  Cervical cancer screening: Last pap approximately 12/27/2018.  By PCP.-declined today Immunizations: Tetanus UTD 06/2019, influenza administered -given , Shingrix  series completed, prevnar 20 -declined today Infectious disease screening: HIV and hep C completed. DEXA:ordered again, same lx as mam Patient was encouraged to exercise greater than 150 minutes a week. Patient was encouraged to choose a diet filled with fresh fruits and vegetables, and lean meats. AVS provided to patient today for education/recommendation on gender specific health and safety maintenance.  Return in about 1 year (around 09/23/2025) for cpe (20 min).  Orders Placed This Encounter  Procedures   DG Bone Density   MM 3D SCREENING MAMMOGRAM BILATERAL BREAST   Flu vaccine HIGH DOSE PF(Fluzone Trivalent)   CBC   Comprehensive metabolic panel with GFR   Hemoglobin A1c   TSH   Lipid panel   No orders of the defined types were placed in this encounter.  Referral Orders  No referral(s) requested today     Electronically signed by: Charlies Bellini, DO [facility]

## 2024-09-22 NOTE — Patient Instructions (Addendum)

## 2024-09-23 ENCOUNTER — Ambulatory Visit: Payer: Self-pay | Admitting: Family Medicine

## 2024-09-23 NOTE — Telephone Encounter (Signed)
 No further action needed at this time.
